# Patient Record
Sex: Male | Born: 1955 | Race: Black or African American | Hispanic: No | Marital: Married | Smoking: Former smoker
Health system: Southern US, Community
[De-identification: ages and names within clinical notes are randomized; demographics above are authoritative.]

## PROBLEM LIST (undated history)

## (undated) DIAGNOSIS — C801 Malignant (primary) neoplasm, unspecified: Secondary | ICD-10-CM

## (undated) DIAGNOSIS — F4310 Post-traumatic stress disorder, unspecified: Secondary | ICD-10-CM

## (undated) DIAGNOSIS — J449 Chronic obstructive pulmonary disease, unspecified: Secondary | ICD-10-CM

## (undated) DIAGNOSIS — J439 Emphysema, unspecified: Secondary | ICD-10-CM

## (undated) DIAGNOSIS — I10 Essential (primary) hypertension: Secondary | ICD-10-CM

## (undated) DIAGNOSIS — J302 Other seasonal allergic rhinitis: Secondary | ICD-10-CM

## (undated) HISTORY — PX: LUNG LOBECTOMY: SHX167

## (undated) HISTORY — DX: Other seasonal allergic rhinitis: J30.2

## (undated) HISTORY — DX: Post-traumatic stress disorder, unspecified: F43.10

## (undated) HISTORY — DX: Essential (primary) hypertension: I10

## (undated) HISTORY — PX: BREAST SURGERY: SHX581

## (undated) HISTORY — DX: Malignant (primary) neoplasm, unspecified: C80.1

---

## 1978-12-17 HISTORY — PX: OTHER SURGICAL HISTORY: SHX169

## 1980-12-17 HISTORY — PX: OTHER SURGICAL HISTORY: SHX169

## 1999-12-18 HISTORY — PX: COLON RESECTION: SHX5231

## 2010-06-23 ENCOUNTER — Emergency Department (HOSPITAL_COMMUNITY): Admission: EM | Admit: 2010-06-23 | Discharge: 2010-06-23 | Payer: Self-pay | Admitting: Family Medicine

## 2010-07-11 ENCOUNTER — Telehealth: Payer: Self-pay | Admitting: Physician Assistant

## 2010-07-11 ENCOUNTER — Ambulatory Visit: Payer: Self-pay | Admitting: Internal Medicine

## 2010-07-11 ENCOUNTER — Encounter: Payer: Self-pay | Admitting: Physician Assistant

## 2010-07-11 ENCOUNTER — Encounter (INDEPENDENT_AMBULATORY_CARE_PROVIDER_SITE_OTHER): Payer: Self-pay | Admitting: *Deleted

## 2010-07-11 DIAGNOSIS — R350 Frequency of micturition: Secondary | ICD-10-CM

## 2010-07-11 DIAGNOSIS — Z8546 Personal history of malignant neoplasm of prostate: Secondary | ICD-10-CM | POA: Insufficient documentation

## 2010-07-11 DIAGNOSIS — I10 Essential (primary) hypertension: Secondary | ICD-10-CM | POA: Insufficient documentation

## 2010-07-11 DIAGNOSIS — J310 Chronic rhinitis: Secondary | ICD-10-CM | POA: Insufficient documentation

## 2010-07-11 DIAGNOSIS — R319 Hematuria, unspecified: Secondary | ICD-10-CM

## 2010-07-11 DIAGNOSIS — Z85038 Personal history of other malignant neoplasm of large intestine: Secondary | ICD-10-CM | POA: Insufficient documentation

## 2010-07-11 DIAGNOSIS — B1710 Acute hepatitis C without hepatic coma: Secondary | ICD-10-CM | POA: Insufficient documentation

## 2010-07-11 LAB — CONVERTED CEMR LAB
Glucose, Urine, Semiquant: NEGATIVE
Ketones, urine, test strip: NEGATIVE
Nitrite: NEGATIVE
Rapid HIV Screen: NEGATIVE
Specific Gravity, Urine: >=1.03
pH: 5

## 2010-07-12 ENCOUNTER — Encounter: Payer: Self-pay | Admitting: Physician Assistant

## 2010-07-13 ENCOUNTER — Encounter: Payer: Self-pay | Admitting: Physician Assistant

## 2010-07-14 LAB — CONVERTED CEMR LAB
Albumin: 4 g/dL (ref 3.5–5.2)
BUN: 13 mg/dL (ref 6–23)
Barbiturate Quant, Ur: NEGATIVE
Benzodiazepines.: NEGATIVE
Calcium: 8.8 mg/dL (ref 8.4–10.5)
Casts: NONE SEEN /lpf
Cocaine Metabolites: NEGATIVE
Eosinophils Absolute: 0.2 10*3/uL (ref 0.0–0.7)
Eosinophils Relative: 3 % (ref 0–5)
Glucose, Bld: 105 mg/dL — ABNORMAL HIGH (ref 70–99)
HCT: 42.3 % (ref 39.0–52.0)
Hep B S Ab: NEGATIVE
Hepatitis B Surface Ag: NEGATIVE
Lymphs Abs: 1.6 10*3/uL (ref 0.7–4.0)
MCHC: 31.7 g/dL (ref 30.0–36.0)
MCV: 83.4 fL (ref 78.0–100.0)
Monocytes Absolute: 0.6 10*3/uL (ref 0.1–1.0)
Opiate Screen, Urine: NEGATIVE
Phencyclidine (PCP): NEGATIVE
RBC / HPF: NONE SEEN (ref <3)
RDW: 17.8 % — ABNORMAL HIGH (ref 11.5–15.5)
Sodium: 138 meq/L (ref 135–145)
Squamous Epithelial / LPF: NONE SEEN /lpf
TSH: 1.498 microintl units/mL (ref 0.350–4.500)
WBC, UA: NONE SEEN cells/hpf (ref <3)

## 2010-08-10 ENCOUNTER — Ambulatory Visit (HOSPITAL_COMMUNITY): Admission: RE | Admit: 2010-08-10 | Discharge: 2010-08-10 | Payer: Self-pay | Admitting: Urology

## 2010-08-14 ENCOUNTER — Ambulatory Visit: Payer: Self-pay | Admitting: Gastroenterology

## 2010-08-14 ENCOUNTER — Encounter (INDEPENDENT_AMBULATORY_CARE_PROVIDER_SITE_OTHER): Payer: Self-pay | Admitting: *Deleted

## 2010-08-28 ENCOUNTER — Ambulatory Visit: Payer: Self-pay | Admitting: Gastroenterology

## 2010-08-31 ENCOUNTER — Encounter: Payer: Self-pay | Admitting: Gastroenterology

## 2010-08-31 ENCOUNTER — Inpatient Hospital Stay (HOSPITAL_COMMUNITY): Admission: RE | Admit: 2010-08-31 | Discharge: 2010-09-01 | Payer: Self-pay | Admitting: Urology

## 2010-08-31 ENCOUNTER — Encounter (INDEPENDENT_AMBULATORY_CARE_PROVIDER_SITE_OTHER): Payer: Self-pay | Admitting: Urology

## 2010-09-16 HISTORY — PX: PROSTATECTOMY: SHX69

## 2010-10-03 ENCOUNTER — Encounter: Payer: Self-pay | Admitting: Physician Assistant

## 2010-10-03 DIAGNOSIS — Z860100 Personal history of colon polyps, unspecified: Secondary | ICD-10-CM | POA: Insufficient documentation

## 2010-10-09 ENCOUNTER — Encounter (INDEPENDENT_AMBULATORY_CARE_PROVIDER_SITE_OTHER): Payer: Self-pay | Admitting: Nurse Practitioner

## 2010-10-09 ENCOUNTER — Ambulatory Visit: Payer: Self-pay | Admitting: Physician Assistant

## 2010-10-09 LAB — CONVERTED CEMR LAB
LDL Cholesterol: 114 mg/dL — ABNORMAL HIGH (ref 0–99)
Total CHOL/HDL Ratio: 4.7
VLDL: 17 mg/dL (ref 0–40)

## 2010-10-11 ENCOUNTER — Encounter (INDEPENDENT_AMBULATORY_CARE_PROVIDER_SITE_OTHER): Payer: Self-pay | Admitting: Nurse Practitioner

## 2010-10-25 ENCOUNTER — Ambulatory Visit: Payer: Self-pay | Admitting: Nurse Practitioner

## 2010-10-25 ENCOUNTER — Encounter: Payer: Self-pay | Admitting: Physician Assistant

## 2010-10-25 LAB — CONVERTED CEMR LAB
Bilirubin Urine: NEGATIVE
Glucose, Urine, Semiquant: NEGATIVE
Ketones, urine, test strip: NEGATIVE
Microalb, Ur: 1.8 mg/dL (ref 0.00–1.89)

## 2010-10-26 ENCOUNTER — Encounter (INDEPENDENT_AMBULATORY_CARE_PROVIDER_SITE_OTHER): Payer: Self-pay | Admitting: Nurse Practitioner

## 2010-10-26 LAB — CONVERTED CEMR LAB: HCV Genotype: 2

## 2010-11-08 ENCOUNTER — Ambulatory Visit
Admission: RE | Admit: 2010-11-08 | Discharge: 2010-11-15 | Payer: Self-pay | Source: Home / Self Care | Admitting: Radiation Oncology

## 2010-12-19 ENCOUNTER — Ambulatory Visit
Admission: RE | Admit: 2010-12-19 | Discharge: 2011-01-16 | Payer: Self-pay | Source: Home / Self Care | Attending: Radiation Oncology | Admitting: Radiation Oncology

## 2011-01-16 NOTE — Miscellaneous (Signed)
  Clinical Lists Changes  Problems: Added new problem of COLONIC POLYPS, HX OF (ICD-V12.72) - multiple polyps removed at colo in 09/2010; tubular adenomas; repeat 2012 Observations: Added new observation of PAST MED HX: Prostate cancer, hx of   a.  dx 2011 by biopsy in [place], [place]   b.  appt with Dr. Heloise Purpura 07/25/2010 Hypertension Hepatitis C   a. viral load + 7.2011 Colon cancer, hx of   a.  dx 2000   b.  last colo done 2007 Colonic polyps, hx of   a.  multiple polyps removed at colo in 09/2010; tubular adenomas; repeat 2012 (10/03/2010 11:56) Added new observation of PMH COLONPYP: yes (10/03/2010 11:56)       Past History:  Past Medical History: Prostate cancer, hx of   a.  dx 2011 by biopsy in [place], [place]   b.  appt with Dr. Heloise Purpura 07/25/2010 Hypertension Hepatitis C   a. viral load + 7.2011 Colon cancer, hx of   a.  dx 2000   b.  last colo done 2007 Colonic polyps, hx of   a.  multiple polyps removed at colo in 09/2010; tubular adenomas; repeat 2012

## 2011-01-16 NOTE — Procedures (Signed)
Summary: Colonoscopy  Patient: Jose Ferguson Note: All result statuses are Final unless otherwise noted.  Tests: (1) Colonoscopy (COL)   COL Colonoscopy           DONE     [facility]     [address]     [place], [place]  [address]           COLONOSCOPY PROCEDURE REPORT           PATIENT:  [name], [name]  MR#:  [id number]     BIRTHDATE:  [birth year], 54 yrs. old  GENDER:  male     ENDOSCOPIST:  Rachael Fee, MD     REF. BY:  Julieanne Manson, M.D.     PROCEDURE DATE:  08/28/2010     PROCEDURE:  Colonoscopy with snare polypectomy     ASA CLASS:  Class II     INDICATIONS:  history of colon cancer (colon resection/chemo     around 2000 at [facility])     MEDICATIONS:   Fentanyl 75 mcg IV, Versed 7 mg IV           DESCRIPTION OF PROCEDURE:   After the risks benefits and     alternatives of the procedure were thoroughly explained, informed     consent was obtained.  Digital rectal exam was performed and     revealed no rectal masses.   The [id number] endoscope was     introduced through the anus and advanced to the anastomosis,     without limitations.  The quality of the prep was adequate, using     MoviPrep.  The instrument was then slowly withdrawn as the colon     was fully examined.     <<PROCEDUREIMAGES>>     FINDINGS:  There were 30 small, sessile polyps found in colon     spread evenly throughout. These were 2-23mm across. All were     removed with cold snare, most were retrieved and sent to pathology     in two jars (see image3, image6, and image8).  There was a     surgical anastomosis. Ileocolonic anastomosis was slightly     ulcerated (linearly) but was otherwise normal) (see image1 and     image4).  This was otherwise a normal examination of the colon     (see image9).   Retroflexed views in the rectum revealed no     abnormalities.    The scope was then withdrawn from the patient     and the procedure completed.     COMPLICATIONS:  None        ENDOSCOPIC IMPRESSION:     1) Thirty subcentimeter polyps, all removed.  Most retrieved and     sent to pathology     2) Ileocolonic anastomosis, linearly ulcerated but otherwise     normal     3) Otherwise normal examination           RECOMMENDATIONS:     1) You will receive a letter within 1-2 weeks with the results     of your biopsy as well as final recommendations. Please call my     office if you have not received a letter after 3 weeks.  Likely     you will need a repeat examination in one year.           ______________________________     Rachael Fee, MD  n.     eSIGNED:   Rachael Fee at 08/28/2010 11:54 AM           Delford Field, [id number]  Note: An exclamation mark (!) indicates a result that was not dispersed into the flowsheet. Document Creation Date: 08/28/2010 11:56 AM _______________________________________________________________________  (1) Order result status: Final Collection or observation date-time: 08/28/2010 11:49 Requested date-time:  Receipt date-time:  Reported date-time:  Referring Physician:   Ordering Physician: Rob Bunting [phone]) Specimen Source:  Source: Launa Grill Order Number: [id number] Lab site:   Appended Document: Colonoscopy     Procedures Next Due Date:    Colonoscopy: 08/2011

## 2011-01-16 NOTE — Assessment & Plan Note (Signed)
Summary: HTN   Vital Signs:  Patient profile:   55 year old male Height:      71 inches Weight:      221.2 pounds BMI:     30.96 Temp:     98.0 degrees F oral Pulse rate:   88 / minute Pulse rhythm:   regular Resp:     20 per minute BP sitting:   134 / 100  (left arm) Cuff size:   large  Vitals Entered By: Levon Hedger (October 25, 2010 11:05 AM)  Nutrition Counseling: Patient's BMI is greater than 25 and therefore counseled on weight management options. CC: CPE, Hypertension Management Is Patient Diabetic? No Pain Assessment Patient in pain? no       Does patient need assistance? Functional Status Self care Ambulation Normal   Primary Care Provider:  Tereso Newcomer, PA-C  CC:  CPE and Hypertension Management.  History of Present Illness:  Pt into the office for a complete physical exam.  Optho - wears reading glasses but no recent eye exam.  Notes that he does have some problems when reading  Dental - he does have an upcoming dental appt  Tdap - unable to recall when he last received his last tetanus.    Hx - prostate cancer (removed 10/01/2010) Pt has f/u with Urology on 11/01/2010 at 10:00  Colonscopy - done 08/2010   Hypertension History:      He denies headache, chest pain, and palpitations.  pt did NOT take his blood pressure medcations yet today.        Positive major cardiovascular risk factors include male age 23 years old or older and hypertension.  Negative major cardiovascular risk factors include non-tobacco-user status.     Habits & Providers  Alcohol-Tobacco-Diet     Alcohol drinks/day: 0     Tobacco Status: quit  Exercise-Depression-Behavior     Does Patient Exercise: yes     Have you felt down or hopeless? no     Have you felt little pleasure in things? no     Depression Counseling: not indicated; screening negative for depression     Drug Use: no  Comments: PHQ- 9 score = 8  Allergies (verified): No Known Drug  Allergies  Social History: Does Patient Exercise:  yes  Review of Systems General:  Denies fever. Eyes:  Complains of blurring; when reading. ENT:  Denies earache. CV:  Denies fatigue. Resp:  Denies cough. GI:  Denies abdominal pain, nausea, and vomiting. GU:  Complains of urinary frequency; denies dysuria and nocturia. MS:  Denies joint pain. Derm:  Denies dryness. Neuro:  Denies headaches. Psych:  Denies anxiety and depression.  Physical Exam  General:  alert.   Head:  normocephalic.  bald Eyes:  pupils equal and pupils round.   Ears:  bil TM with bony landmarks Nose:  no nasal discharge.   Mouth:  pharynx pink and moist and poor dentition.  uvula midline Neck:  supple.   Chest Wall:  no masses.   Breasts:  no gynecomastia.   Lungs:  normal breath sounds.   Heart:  normal rate and regular rhythm.   Abdomen:  normal bowel sounds.   Rectal:  defer Genitalia:  defer Prostate:  defer Msk:  normal ROM.   Pulses:  R radial normal and L radial normal.   Extremities:  no edema Neurologic:  alert & oriented X3.   Skin:  color normal.   Psych:  Oriented X3.     Impression &  Recommendations:  Problem # 1:  HYPERTENSION (ICD-401.9) BP elevated today DASH diet pt has NOT taken his meds today His updated medication list for this problem includes:    Lisinopril 10 Mg Tabs (Lisinopril) .Marland Kitchen... Take 1 tablet by mouth once a day  Orders: T-Urine Microalbumin w/creat. ratio [phone]) EKG w/ Interpretation (93000)  Problem # 2:  PROSTATE CANCER, HX OF (ICD-V10.46) pt to maintain f/u with urology  Problem # 3:  NEED PROPHYLACTIC VACCINATION&INOCULATION FLU (ICD-V04.81) given today in office  Problem # 4:  HEPATITIS C (ICD-070.51) will need referral Medical speciality services Orders: T-Hepatitis C Viral Load (44010-27253) T-Hepattis C Genotype, DNA (66440-34742) T-Protime, Auto (59563-87564) will given tetanus today  Problem # 6:  CHRONIC RHINITIS  (ICD-472.0) advised pt to use nasal spray two times a day   Complete Medication List: 1)  Lisinopril 10 Mg Tabs (Lisinopril) .... Take 1 tablet by mouth once a day 2)  Flonase 50 Mcg/act Susp (Fluticasone propionate) .... One spray in each nostril two times a day  Other Orders: Flu Vaccine 34yrs + (33295) Admin 1st Vaccine (18841) UA Dipstick w/o Micro (manual) (81002) Tdap => 16yrs IM (66063) Admin of Any Addtl Vaccine (01601)  Hypertension Assessment/Plan:      The patient's hypertensive risk group is category B: At least one risk factor (excluding diabetes) with no target organ damage.  His calculated 10 year risk of coronary heart disease is 22 %.  Today's blood pressure is 134/100.  His blood pressure goal is < 140/90.  Patient Instructions: 1)  You have received the flu and tetanus vaccine today. 2)  Your labs will be checked for hepatitis.  You will need referral to the specialist. 3)  Return to office in 2 weeks for blood pressure. 4)  Take your medications before this visit   Orders Added: 1)  Flu Vaccine 69yrs + [90658] 2)  Admin 1st Vaccine [90471] 3)  Est. Patient Level III [09323] 4)  T-Urine Microalbumin w/creat. ratio [82043-[address]] 5)  UA Dipstick w/o Micro (manual) [81002] 6)  EKG w/ Interpretation [93000] 7)  T-Hepatitis C Viral Load [phone] 8)  T-Hepattis C Genotype, DNA [87902-82925] 9)  T-Protime, Auto [27062-37628] 10)  Tdap => 47yrs IM [90715] 11)  Admin of Any Addtl Vaccine [90472]   Immunizations Administered:  Influenza Vaccine # 1:    Vaccine Type: Fluvax 3+    Site: left deltoid    Mfr: GlaxoSmithKline    Dose: 0.5 ml    Route: IM    Given by: Levon Hedger    Exp. Date: 06/16/2011    Lot #: [id number]    VIS given: 07/11/10 version given October 25, 2010.  Tetanus Vaccine:    Vaccine Type: Tdap    Site: right deltoid    Mfr: Sanofi Pasteur    Dose: 0.5 ml    Route: IM    Given by: Levon Hedger    Exp. Date: 01/03/2013     Lot #: [id number]    VIS given: 11/03/08 version given October 25, 2010.  Flu Vaccine Consent Questions:    Do you have a history of severe allergic reactions to this vaccine? no    Any prior history of allergic reactions to egg and/or gelatin? no    Do you have a sensitivity to the preservative Thimersol? no    Do you have a past history of Guillan-Barre Syndrome? no    Do you currently have an acute febrile illness? no    Have you  ever had a severe reaction to latex? no    Vaccine information given and explained to patient? yes    ndc  [phone]  Immunizations Administered:  Influenza Vaccine # 1:    Vaccine Type: Fluvax 3+    Site: left deltoid    Mfr: GlaxoSmithKline    Dose: 0.5 ml    Route: IM    Given by: Levon Hedger    Exp. Date: 06/16/2011    Lot #: [id number]    VIS given: 07/11/10 version given October 25, 2010.  Tetanus Vaccine:    Vaccine Type: Tdap    Site: right deltoid    Mfr: Sanofi Pasteur    Dose: 0.5 ml    Route: IM    Given by: Levon Hedger    Exp. Date: 01/03/2013    Lot #: [id number]    VIS given: 11/03/08 version given October 25, 2010.  Laboratory Results   Urine Tests  Date/Time Received: October 25, 2010 11:20 AM   Routine Urinalysis   Glucose: negative   (Normal Range: Negative) Bilirubin: negative   (Normal Range: Negative) Ketone: negative   (Normal Range: Negative) Spec. Gravity: >=1.030   (Normal Range: 1.003-1.035) Blood: trace-intact   (Normal Range: Negative) pH: 5.0   (Normal Range: 5.0-8.0) Protein: negative   (Normal Range: Negative) Urobilinogen: 0.2   (Normal Range: 0-1) Nitrite: negative   (Normal Range: Negative) Leukocyte Esterace: trace   (Normal Range: Negative)          Prevention & Chronic Care Immunizations   Influenza vaccine: Fluvax 3+  (10/25/2010)    Tetanus booster: 10/25/2010: Tdap    Pneumococcal vaccine: Not documented  Colorectal Screening   Hemoccult: recent colonscopy   (10/25/2010)    Colonoscopy: DONE  (08/28/2010)   Colonoscopy due: 08/2011  Other Screening   PSA: done at Urology  (10/25/2010)   Smoking status: quit  (10/25/2010)  Lipids   Total Cholesterol: 166  (10/09/2010)   LDL: 114  (10/09/2010)   LDL Direct: Not documented   HDL: 35  (10/09/2010)   Triglycerides: 85  (10/09/2010)  Hypertension   Last Blood Pressure: 134 / 100  (10/25/2010)   Serum creatinine: 0.95  (07/12/2010)   Serum potassium 4.1  (07/12/2010)  Self-Management Support :    Hypertension self-management support: Not documented   Nursing Instructions: Give Flu vaccine today     EKG  Procedure date:  10/25/2010  Findings:      sinus rhythm with PVS   Appended Document: Lab redraw Pt. will need to RTC to have Hepatitis C RNA Quant and Hepatitis C Virus Genotype redrawn. Left message on answer machine for pt. to return call. Gaylyn Cheers RN  October 25, 2010 4:55 PM  Pt. returned call will come in today for repeat labs Gaylyn Cheers RN  October 26, 2010 11:03 AM  Pt. had labs redrawn to day Gaylyn Cheers RN  October 26, 2010 4:50 PM        Clinical Lists Changes

## 2011-01-16 NOTE — Miscellaneous (Signed)
Summary: [facility] Previsit/perp  Clinical Lists Changes  Medications: Added new medication of DULCOLAX 5 MG  TBEC (BISACODYL) Day before procedure take 2 at 3pm and 2 at 8pm. - Signed Added new medication of METOCLOPRAMIDE HCL 10 MG  TABS (METOCLOPRAMIDE HCL) As per prep instructions. - Signed Added new medication of MIRALAX   POWD (POLYETHYLENE GLYCOL 3350) As per prep  instructions. - Signed Rx of DULCOLAX 5 MG  TBEC (BISACODYL) Day before procedure take 2 at 3pm and 2 at 8pm.;  #4 x 0;  Signed;  Entered by: Wyona Almas RN;  Authorized by: Rachael Fee MD;  Method used: Electronically to [facility], [place], [place]  [id number], Ph: [phone], Fax: [phone] Rx of METOCLOPRAMIDE HCL 10 MG  TABS (METOCLOPRAMIDE HCL) As per prep instructions.;  #2 x 0;  Signed;  Entered by: Wyona Almas RN;  Authorized by: Rachael Fee MD;  Method used: Electronically to [facility], [place], [place]  [id number], Ph: [phone], Fax: [phone] Rx of MIRALAX   POWD (POLYETHYLENE GLYCOL 3350) As per prep  instructions.;  #255gm x 0;  Signed;  Entered by: Wyona Almas RN;  Authorized by: Rachael Fee MD;  Method used: Electronically to [facility], [place], [place]  [id number], Ph: [phone], Fax: [phone] Observations: Added new observation of NKA: T (08/14/2010 14:04)    Prescriptions: MIRALAX   POWD (POLYETHYLENE GLYCOL 3350) As per prep  instructions.  #255gm x 0   Entered by:   Wyona Almas RN   Authorized by:   Rachael Fee MD   Signed by:   Wyona Almas RN on 08/14/2010   Method used:   Electronically to        [facility] (retail)       [address]       [place], [place]  [id number]       Ph: [phone]       Fax: [phone]   RxID:   [id number] METOCLOPRAMIDE HCL 10 MG  TABS (METOCLOPRAMIDE HCL) As per prep  instructions.  #2 x 0   Entered by:   Wyona Almas RN   Authorized by:   Rachael Fee MD   Signed by:   Wyona Almas RN on 08/14/2010   Method used:   Electronically to        [facility] (retail)       [address]       [place], [place]  [id number]       Ph: [phone]       Fax: [phone]   RxID:   [id number] DULCOLAX 5 MG  TBEC (BISACODYL) Day before procedure take 2 at 3pm and 2 at 8pm.  #4 x 0   Entered by:   Wyona Almas RN   Authorized by:   Rachael Fee MD   Signed by:   Wyona Almas RN on 08/14/2010   Method used:   Electronically to        [facility] (retail)       [address]       [place], [place]  [id number]       Ph: [phone]       Fax: [phone]   RxID:   [id number]

## 2011-01-16 NOTE — Letter (Signed)
Summary: [facility] Instructions  [facility]  [address] [place], [place] [address]   Phone: [phone]  Fax: [phone]       Jose Ferguson    [birth year]    MRN: [id number]       Procedure Day [name]:  Duanne Ferguson  08/28/10     Arrival Time: 10:00AM     Procedure Time:  11:00AM     Location of Procedure:                    X  [facility] (4th Floor)   PREPARATION FOR COLONOSCOPY WITH MIRALAX  Starting 5 days prior to your procedure 08/23/10 do not eat nuts, seeds, popcorn, corn, beans, peas,  salads, or any raw vegetables.  Do not take any fiber supplements (e.g. Metamucil, Citrucel, and Benefiber). ____________________________________________________________________________________________________   THE DAY BEFORE YOUR PROCEDURE         DATE: 08/27/10  DAY: SUNDAY  1   Drink clear liquids the entire day-NO SOLID FOOD  2   Do not drink anything colored red or purple.  Avoid juices with pulp.  No orange juice.  3   Drink at least 64 oz. (8 glasses) of fluid/clear liquids during the day to prevent dehydration and help the prep work efficiently.  CLEAR LIQUIDS INCLUDE: Water Jello Ice Popsicles Tea (sugar ok, no milk/cream) Powdered fruit flavored drinks Coffee (sugar ok, no milk/cream) Gatorade Juice: apple, white grape, white cranberry  Lemonade Clear bullion, consomm, broth Carbonated beverages (any kind) Strained chicken noodle soup Hard Candy  4   Mix the entire bottle of Miralax with 64 oz. of Gatorade/Powerade in the morning and put in the refrigerator to chill.  5   At 3:00 pm take 2 Dulcolax/Bisacodyl tablets.  6   At 4:30 pm take one Reglan/Metoclopramide tablet.  7  Starting at 5:00 pm drink one 8 oz glass of the Miralax mixture every 15-20 minutes until you have finished drinking the entire 64 oz.  You should finish drinking prep around 7:30 or 8:00 pm.  8   If you are nauseated, you may take the 2nd Reglan/Metoclopramide tablet at  6:30 pm.        9    At 8:00 pm take 2 more DULCOLAX/Bisacodyl tablets.     THE DAY OF YOUR PROCEDURE      DATE:  08/28/10  DAY: Duanne Ferguson  You may drink clear liquids until 9:00AM  (2 HOURS BEFORE PROCEDURE).   MEDICATION INSTRUCTIONS  Unless otherwise instructed, you should take regular prescription medications with a small sip of water as early as possible the morning of your procedure.          OTHER INSTRUCTIONS  You will need a responsible adult at least 57 years of age to accompany you and drive you home.   This person must remain in the waiting room during your procedure.  Wear loose fitting clothing that is easily removed.  Leave jewelry and other valuables at home.  However, you may wish to bring a book to read or an iPod/MP3 player to listen to music as you wait for your procedure to start.  Remove all body piercing jewelry and leave at home.  Total time from sign-in until discharge is approximately 2-3 hours.  You should go home directly after your procedure and rest.  You can resume normal activities the day after your procedure.  The day of your procedure you should not:   Drive  Make legal decisions   Operate machinery   Drink alcohol   Return to work  You will receive specific instructions about eating, activities and medications before you leave.   The above instructions have been reviewed and explained to me by   Wyona Almas RN  August 14, 2010 3:11 PM     I fully understand and can verbalize these instructions _____________________________ Date _______

## 2011-01-16 NOTE — Letter (Signed)
Summary: Previsit letter  [facility]  [address] [place], [place] [address]   Phone: [phone]  Fax: [phone]       07/11/2010 MRN: [id number]  Jose Ferguson [address] [place], [place]  [address]  Dear Mr. Shadduck,  Welcome to the Gastroenterology Division at [facility].    You are scheduled to see a nurse for your pre-procedure visit on 08-14-10 at 2:30p.m. on the 3rd floor at [facility], [address]  We ask that you try to arrive at our office 15 minutes prior to your appointment time to allow for check-in.  Your nurse visit will consist of discussing your medical and surgical history, your immediate family medical history, and your medications.    Please bring a complete list of all your medications or, if you prefer, bring the medication bottles and we will list them.  We will need to be aware of both prescribed and over the counter drugs.  We will need to know exact dosage information as well.  If you are on blood thinners (Coumadin, Plavix, Aggrenox, Ticlid, etc.) please call our office today/prior to your appointment, as we need to consult with your physician about holding your medication.   Please be prepared to read and sign documents such as consent forms, a financial agreement, and acknowledgement forms.  If necessary, and with your consent, a friend or relative is welcome to sit-in on the nurse visit with you.  Please bring your insurance card so that we may make a copy of it.  If your insurance requires a referral to see a specialist, please bring your referral form from your primary care physician.  No co-pay is required for this nurse visit.     If you cannot keep your appointment, please call [phone] to cancel or reschedule prior to your appointment date.  This allows [place] the opportunity to schedule an appointment for another patient in need of care.    Thank you for choosing [facility] for your medical needs.  We  appreciate the opportunity to care for you.  Please visit [place] at our website  to learn more about our practice.                     Sincerely.                                                                                                                   The Gastroenterology Division

## 2011-01-16 NOTE — Progress Notes (Signed)
Summary: GI referral  Phone Note Outgoing Call   Summary of Call: Needs GI referral for follow up colonoscopy for h/o colon cancer.  Referral letter in system. Initial call taken by: Tereso Newcomer PA-C,  July 11, 2010 3:12 PM

## 2011-01-16 NOTE — Assessment & Plan Note (Signed)
Summary: FXU;HTN; UNTREATED PROSTATE CANCER;RX REFILL//JG   Vital Signs:  Patient profile:   55 year old male Temp:     98.0 degrees F oral Pulse rate:   91 / minute Pulse rhythm:   regular Resp:     18 per minute BP sitting:   113 / 79  (right arm) Cuff size:   large  Vitals Entered By: Arm[place] Shannon (July 11, 2010 2:09 PM) CC: follow-up visit, prostate cancer, occassional prostate pain ( 4 months), aching pain, constantRx refill, Hypertension Management Is Patient Diabetic? No Pain Assessment Patient in pain? no       Does patient need assistance? Functional Status Self care Ambulation Normal   Primary Care Provider:  Tereso Newcomer, PA-C  CC:  follow-up visit, prostate cancer, occassional prostate pain ( 4 months), aching pain, constantRx refill, and Hypertension Management.  History of Present Illness: New patient.  Just released from prison June 9 after 35 years.  He was on BP meds while in prison.  Dx earlier this year.  Also, dx with prostate cancer in February.  States he had pain in groin and testicles and numbness in penis.  He states he was seen by a urologist in [place], [place] and had a biopsy.  Biopsy was + for cancer.  No treatment was started.  He has made an appt with Dr. Heloise Purpura 07/25/2010 at [facility].  Hypertension History:      He denies headache, chest pain, dyspnea with exertion, and syncope.  He notes no problems with any antihypertensive medication side effects.        Positive major cardiovascular risk factors include male age 90 years old or older and hypertension.  Negative major cardiovascular risk factors include non-tobacco-user status.     Habits & Providers  Alcohol-Tobacco-Diet     Tobacco Status: quit  Exercise-Depression-Behavior     Drug Use: no  Allergies (verified): No Known Drug Allergies  Past History:  Past Medical History: Prostate cancer, hx of   a.  dx 2011 by biopsy in [place], [place]   b.  appt with Dr. Heloise Purpura 07/25/2010 Hypertension Hepatitis C Colon cancer, hx of   a.  dx 2000   b.  last colo done 2007  Past Surgical History: s/p prostate biopsy 01/2010 s/p partial colectomy 2000 2/2 cancer   a.  last colo. done 2007 s/p right thumb surgery   a.  lacerated distal flexor tendon (patient cannot flex distal thumb) s/p right inguinal hernia repair  Family History: DM - sis HTN - 3 sis Colon cancer - sister  Social History: unemployed just released from prison after 22 years Former Smoker (quit 2008)   a.  36 pack years Alcohol use-no   a.  previously heavy drinker (case of beer a day) Drug use-no   a.  admits to previous marijuana use Smoking Status:  quit Drug Use:  no  Review of Systems      See HPI General:  Denies chills and fever. ENT:  Complains of nasal congestion. CV:  Denies chest pain or discomfort and shortness of breath with exertion. Resp:  Denies cough. GI:  Denies bloody stools and dark tarry stools. GU:  Complains of urinary frequency. Psych:  Denies depression.  Physical Exam  General:  alert, well-developed, and well-nourished.   Head:  normocephalic and atraumatic.   Eyes:  pupils equal, pupils round, pupils reactive to light, and no optic disk abnormalities.   Ears:  R ear normal and L  ear normal.   Nose:  mucosal erythema and mucosal edema.   Mouth:  pharynx pink and moist, no erythema, and no exudates.   Neck:  supple, no thyromegaly, and no cervical lymphadenopathy.   Lungs:  normal breath sounds, no crackles, and no wheezes.   Heart:  normal rate, regular rhythm, and no murmur.  occ skipped beat  Abdomen:  soft, non-tender, normal bowel sounds, and no hepatomegaly.   Extremities:  no edema Neurologic:  alert & oriented X3 and cranial nerves II-XII intact.   Psych:  normally interactive and good eye contact.     Impression & Recommendations:  Problem # 1:  HYPERTENSION (ICD-401.9)  controlled continue current meds  His updated  medication list for this problem includes:    Lisinopril 10 Mg Tabs (Lisinopril) .Marland Kitchen... Take 1 tablet by mouth once a day  Orders: T-Comprehensive Metabolic Panel (25366-44034) T-CBC w/Diff (74259-56387) T-TSH (56433-29518) EKG w/ Interpretation (93000)  Problem # 2:  CHRONIC RHINITIS (ICD-472.0) ? rhinitis medicamentosa rec he stop using the afrin use flonase once daily for 4-6 weeks  Problem # 3:  PROSTATE CANCER, HX OF (ICD-V10.46)  has appt with urology 07/25/2010 has had some increased urination . . . will check u/a will let urologist check PSA, etc. he has records from urology when he was in prison  Orders: UA Dipstick w/o Micro (manual) (84166) T-CBC w/Diff (06301-60109) T-Culture, Urine (32355-73220) T- * Misc. Laboratory test [phone])  Problem # 4:  FREQUENCY, URINARY (ICD-788.41)  Orders: UA Dipstick w/o Micro (manual) (06237) T-Culture, Urine (62831-51761) T- * Misc. Laboratory test [phone])  Problem # 5:  COLON CANCER, HX OF (ICD-V10.05)  no f/u since about 2007 will try to get records refer him to GI for appropriate f/u  Orders: T-CBC w/Diff (10626-94854) T-Hemoccult Cards-Multiple (62703) Gastroenterology Referral (GI)  Problem # 6:  HEPATITIS C (ICD-070.51)  check labs once prostate and colon issues managed, can consider referral to hep c clinic  Orders: T-Comprehensive Metabolic Panel [id number]) T-Drug Screen-Urine, (single) (93716-96789) T-Hepatitis A Antibody (38101-75102) T-Hepatitis B Core Antibody (58527-78242) T-Hepatitis B Surface Antibody (35361-44315) T-Hepatitis B Surface Antigen (40086-76195) T-Hepatitis C Antibody (09326-71245) T-Hepatitis C Viral Load (80998-33825) T-Syphilis Test (RPR) (05397-67341) Rapid HIV  (92370)  Complete Medication List: 1)  Lisinopril 10 Mg Tabs (Lisinopril) .... Take 1 tablet by mouth once a day 2)  Flonase 50 Mcg/act Susp (Fluticasone propionate) .Marland Kitchen.. 1-2 sprays each nostril once  daily  Hypertension Assessment/Plan:      The patient's hypertensive risk group is category B: At least one risk factor (excluding diabetes) with no target organ damage.  Today's blood pressure is 113/79.  His blood pressure goal is < 140/90.  Patient Instructions: 1)  Stop using Afrin and over the counter nasal decongestants. 2)  Use the flonase once daily for 4-6 weeks.  Tape off when you are finished (use every other day for a few days, then stop). 3)  Sign forms to get records.  Need records on your colon cancer, especially. 4)  Please ask the urologist and the gastroenterologist to send me notes.   5)  If you do not hear about an appointment with the gastroenterologist in the next 2 weeks, call us. 6)  Come in for fasting lipids some time before your CPE (Dx. V70.0, 401.1) 7)  Please schedule a follow-up appointment in 3 months with Dushawn Pusey for CPE.  Prescriptions: FLONASE 50 MCG/ACT SUSP (FLUTICASONE PROPIONATE) 1-2 sprays each nostril once daily  #1 x 3  Entered and Authorized by:   Tereso Newcomer PA-C   Signed by:   Tereso Newcomer PA-C on 07/11/2010   Method used:   Print then Give to Patient   RxID:   [id number] LISINOPRIL 10 MG TABS (LISINOPRIL) Take 1 tablet by mouth once a day  #30 x 5   Entered and Authorized by:   Tereso Newcomer PA-C   Signed by:   Tereso Newcomer PA-C on 07/11/2010   Method used:   Print then Give to Patient   RxID:   [id number]   Laboratory Results   Urine Tests    Routine Urinalysis   Color: lt. yellow Glucose: negative   (Normal Range: Negative) Bilirubin: negative   (Normal Range: Negative) Ketone: negative   (Normal Range: Negative) Spec. Gravity: >=1.030   (Normal Range: 1.003-1.035) Blood: small   (Normal Range: Negative) pH: 5.0   (Normal Range: 5.0-8.0) Protein: negative   (Normal Range: Negative) Urobilinogen: 1.0   (Normal Range: 0-1) Nitrite: negative   (Normal Range: Negative) Leukocyte Esterace: negative   (Normal Range:  Negative)      Other Tests  Rapid HIV: negative    Appended Document: FXU;HTN; UNTREATED PROSTATE CANCER;RX REFILL//JG    Clinical Lists Changes  Observations: Added new observation of EKG INTERP: Normal sinus rhythm with rate of:  82 q wave in 3 normal axis PVC no isch changes  (07/11/2010 16:16)       EKG  Procedure date:  07/11/2010  Findings:      Normal sinus rhythm with rate of:  82 q wave in 3 normal axis PVC no isch changes

## 2011-01-16 NOTE — Letter (Signed)
Summary: Lipid Letter  [facility]  [address] [place], [place] [address]   Phone: [phone]  Fax: [phone]    10/11/2010  Cortlan Dolin [address] [place], [place]  [address]  Dear Jillyn Hidden:  We have carefully reviewed your last lipid profile from 10/09/2010 and the results are noted below with a summary of recommendations for lipid management.    Cholesterol:       166     Goal: less than 200   HDL "good" Cholesterol:   35     Goal: greater than 40   LDL "bad" Cholesterol:   114     Goal: less than 130   Triglycerides:       85     Goal: less than 150    Cholesterol labs are ok.     Current Medications: 1)    Lisinopril 10 Mg Tabs (Lisinopril) .... Take 1 tablet by mouth once a day 2)    Flonase 50 Mcg/act Susp (Fluticasone propionate) .Marland Kitchen.. 1-2 sprays each nostril once daily  If you have any questions, please call. We appreciate being able to work with you.   Sincerely,    Lehman Prom, FNP [facility]

## 2011-01-16 NOTE — Letter (Signed)
Summary: REFERRAL/GASTRONENTEROLOGY//APPT DATE & TIME  REFERRAL/GASTRONENTEROLOGY//APPT DATE & TIME   Imported By: Arta Bruce 07/12/2010 12:44:58  _____________________________________________________________________  External Attachment:    Type:   Image     Comment:   External Document

## 2011-01-16 NOTE — Letter (Signed)
Summary: Results Letter  [name]  [address] [place], [place] [address]   Phone: [phone]  Fax: [phone]        August 31, 2010 MRN: [id number]    Jose Ferguson [address] , [place]  [address]    Dear Mr. Neuhaus,   The polyp removed during your recent procedure were proven to be adenomatous.  These are pre-cancerous polyps that may have grown into cancers if they had not been removed.  Based on current nationally recognized surveillance guidelines, I recommend that you have a repeat colonoscopy in 1 year.   We will therefore put your information in our reminder system and will contact you in 1 year to schedule a repeat procedure.  Please call if you have any questions or concerns.       Sincerely,  Rachael Fee MD  This letter has been electronically signed by your physician.  Appended Document: Results Letter letter mailed

## 2011-01-16 NOTE — Letter (Signed)
Summary: *HSN Results Follow up  [facility]  [address] [place], [place] [address]   Phone: [phone]  Fax: [phone]      07/13/2010   Delford Field [address] [place], [place]  [address]   Dear  Mr. Delford Field,                            ____S.Drinkard,FNP   ____D. Gore,FNP       ____B. McPherson,MD   ____V. Rankins,MD    ____E. Mulberry,MD    ____N. Daphine Deutscher, FNP  ____D. Reche Dixon, MD    ____K. Philipp Deputy, MD    __x__S. Alben Spittle, PA-C     This letter is to inform you that your recent test(s):  _______Pap Smear    ___x____Lab Test     _______X-ray    ___x____ is within acceptable limits . . . see below  _______ requires a medication change  _______ requires a follow-up lab visit  _______ requires a follow-up visit with your provider   Comments: Blood counts, thyroid, liver, kidney function all normal.  Syphillis test negative.  HIV test negative.  Hepatitis C labs are still running.  A urine culture is still running.  We will contact you with those results.     _________________________________________________________ If you have any questions, please contact our office                     Sincerely,  Tereso Newcomer PA-C [facility]

## 2011-01-16 NOTE — Progress Notes (Signed)
Summary: Office Visit//DEPRESSION SCREENING  Office Visit//DEPRESSION SCREENING   Imported By: Arta Bruce 10/25/2010 14:34:18  _____________________________________________________________________  External Attachment:    Type:   Image     Comment:   External Document

## 2011-01-16 NOTE — Letter (Signed)
Summary: *Referral Letter  [facility]  [address] [place], [place] [address]   Phone: [phone]  Fax: [phone]    07/11/2010  Thank you in advance for agreeing to see my patient:  Jose Ferguson [address] [place], [place]  [address]  Phone: [phone]  Reason for Referral: 55 yo male s/p partial colectomy due to cancer in 2000 with last colonoscopy done around 2007.  No regular follow up since.  He was released from prison 2 months ago.  Procedures Requested:   Current Medical Problems: 1)  FREQUENCY, URINARY (ICD-788.41) 2)  CHRONIC RHINITIS (ICD-472.0) 3)  COLON CANCER, HX OF (ICD-V10.05) 4)  HEPATITIS C (ICD-070.51) 5)  HYPERTENSION (ICD-401.9) 6)  PROSTATE CANCER, HX OF (ICD-V10.46)   Current Medications: 1)  LISINOPRIL 10 MG TABS (LISINOPRIL) Take 1 tablet by mouth once a day 2)  FLONASE 50 MCG/ACT SUSP (FLUTICASONE PROPIONATE) 1-2 sprays each nostril once daily   Past Medical History: 1)  Prostate cancer, hx of 2)    a.  dx 2011 by biopsy in [place], [place] 3)    b.  appt with Dr. Heloise Purpura 07/25/2010 4)  Hypertension 5)  Hepatitis C 6)  Colon cancer, hx of 7)    a.  dx 2000 8)    b.  last colo done 2007   Prior History of Blood Transfusions:   Pertinent Labs:    Thank you again for agreeing to see our patient; please contact us if you have any further questions or need additional information.  Sincerely,  Tereso Newcomer PA-C

## 2011-01-17 ENCOUNTER — Ambulatory Visit: Payer: Self-pay | Admitting: Radiation Oncology

## 2011-01-19 ENCOUNTER — Ambulatory Visit: Payer: Medicaid Other | Attending: Radiation Oncology | Admitting: Radiation Oncology

## 2011-01-19 DIAGNOSIS — Z510 Encounter for antineoplastic radiation therapy: Secondary | ICD-10-CM | POA: Insufficient documentation

## 2011-01-19 DIAGNOSIS — R5381 Other malaise: Secondary | ICD-10-CM | POA: Insufficient documentation

## 2011-01-19 DIAGNOSIS — C61 Malignant neoplasm of prostate: Secondary | ICD-10-CM | POA: Insufficient documentation

## 2011-02-01 ENCOUNTER — Encounter (INDEPENDENT_AMBULATORY_CARE_PROVIDER_SITE_OTHER): Payer: Self-pay | Admitting: Nurse Practitioner

## 2011-02-01 ENCOUNTER — Encounter: Payer: Self-pay | Admitting: Nurse Practitioner

## 2011-02-01 ENCOUNTER — Telehealth (INDEPENDENT_AMBULATORY_CARE_PROVIDER_SITE_OTHER): Payer: Self-pay | Admitting: Nurse Practitioner

## 2011-02-07 NOTE — Progress Notes (Signed)
Summary: Medical Specialty Referral  Phone Note Outgoing Call   Summary of Call: refer to medical specialty services - hepatitis C Initial call taken by: Lehman Prom FNP,  February 01, 2011 4:56 PM  Follow-up for Phone Call        SENT THE REFERRAL TO [facility] WAITING FOR AN APPT .Marland KitchenCheryll Dessert  February 02, 2011 10:20 AM

## 2011-02-07 NOTE — Assessment & Plan Note (Signed)
Summary: HTN   Vital Signs:  Patient profile:   55 year old male Weight:      223.4 pounds Temp:     97.4 degrees F oral Pulse rate:   88 / minute Pulse rhythm:   regular Resp:     20 per minute BP sitting:   110 / 74  (left arm) Cuff size:   large  Vitals Entered By: Levon Hedger (February 01, 2011 4:04 PM) CC: follow-up visit HTN...needs refills, Hypertension Management Is Patient Diabetic? No Pain Assessment Patient in pain? no       Does patient need assistance? Functional Status Self care Ambulation Normal   Primary Care Provider:  Tereso Newcomer, PA-C  CC:  follow-up visit HTN...needs refills and Hypertension Management.  History of Present Illness:  Pt into the office for htn  Hypertension History:      He denies headache, chest pain, and palpitations.  He notes no problems with any antihypertensive medication side effects.        Positive major cardiovascular risk factors include male age 48 years old or older and hypertension.  Negative major cardiovascular risk factors include no history of diabetes or hyperlipidemia and non-tobacco-user status.     Medications Prior to Update: 1)  Lisinopril 10 Mg Tabs (Lisinopril) .... Take 1 Tablet By Mouth Once A Day 2)  Flonase 50 Mcg/act Susp (Fluticasone Propionate) .... One Spray in Each Nostril Two Times A Day  Allergies (verified): No Known Drug Allergies  Review of Systems General:  Denies fever. ENT:  Complains of nosebleeds; improved with the flonase. CV:  Denies chest pain or discomfort. Resp:  Denies cough. GI:  Denies abdominal pain, nausea, and vomiting.  Physical Exam  General:  alert.   Head:  normocephalic.   Lungs:  normal breath sounds.   Heart:  normal rate and regular rhythm.   Msk:  normal ROM.   Neurologic:  alert & oriented X3.   Skin:  color normal.   Psych:  Oriented X3.     Impression & Recommendations:  Problem # 1:  HYPERTENSION (ICD-401.9) BP stable DASH diet His  updated medication list for this problem includes:    Lisinopril 10 Mg Tabs (Lisinopril) .Marland Kitchen... Take 1 tablet by mouth once a day  Problem # 2:  CHRONIC RHINITIS (ICD-472.0) advise pt to add some humidification to your diet  Problem # 3:  HEPATITIS C (ICD-070.51) labs reviewed from previous visit  Will refer to medical specialty services Orders: Hepatitis C Clinic Referral (HepC)  Complete Medication List: 1)  Lisinopril 10 Mg Tabs (Lisinopril) .... Take 1 tablet by mouth once a day 2)  Flonase 50 Mcg/act Susp (Fluticasone propionate) .... One spray in each nostril two times a day  Hypertension Assessment/Plan:      The patient's hypertensive risk group is category B: At least one risk factor (excluding diabetes) with no target organ damage.  His calculated 10 year risk of coronary heart disease is 11 %.  Today's blood pressure is 110/74.  His blood pressure goal is < 140/90.  Patient Instructions: 1)  Nose - You need a HUMIDIFIER to keep the air moist 2)  Blood pressure - doing well 3)  Both prescriptions have been sent to [facility]. 4)  Follow up in 6 months for blood pressure or sooner if necessary Prescriptions: FLONASE 50 MCG/ACT SUSP (FLUTICASONE PROPIONATE) One spray in each nostril two times a day  #30 x 11   Entered and Authorized by:  Lehman Prom FNP   Signed by:   Lehman Prom FNP on 02/01/2011   Method used:   Electronically to        [facility] [address] (retail)       [address]       [place], [place]  [id number]       Ph: [phone]       Fax: [phone]   RxID:   [id number] LISINOPRIL 10 MG TABS (LISINOPRIL) Take 1 tablet by mouth once a day  #30 x 11   Entered and Authorized by:   Lehman Prom FNP   Signed by:   Lehman Prom FNP on 02/01/2011   Method used:   Electronically to        [facility] (retail)       [address]       [place], [place]  [id number]       Ph: [phone]       Fax:  [phone]   RxID:   [id number]    Orders Added: 1)  Est. Patient Level III [[address]] 2)  Hepatitis C Clinic Referral [HepC]

## 2011-03-01 LAB — CBC
HCT: 39 % (ref 39.0–52.0)
Hemoglobin: 13 g/dL (ref 13.0–17.0)
MCH: 28 pg (ref 26.0–34.0)
MCHC: 33.3 g/dL (ref 30.0–36.0)
MCV: 84 fL (ref 78.0–100.0)
Platelets: 186 10*3/uL (ref 150–400)
RBC: 4.65 MIL/uL (ref 4.22–5.81)
RDW: 14.5 % (ref 11.5–15.5)

## 2011-03-01 LAB — HEMOGLOBIN AND HEMATOCRIT, BLOOD
HCT: 37 % — ABNORMAL LOW (ref 39.0–52.0)
HCT: 44.6 % (ref 39.0–52.0)
Hemoglobin: 12.3 g/dL — ABNORMAL LOW (ref 13.0–17.0)

## 2011-03-01 LAB — BASIC METABOLIC PANEL
Calcium: 8.9 mg/dL (ref 8.4–10.5)
GFR calc non Af Amer: >60 mL/min (ref >60)
Glucose, Bld: 113 mg/dL — ABNORMAL HIGH (ref 70–99)
Sodium: 140 mEq/L (ref 135–145)

## 2011-03-01 LAB — URINE MICROSCOPIC-ADD ON

## 2011-03-01 LAB — URINALYSIS, ROUTINE W REFLEX MICROSCOPIC
Glucose, UA: NEGATIVE mg/dL
Ketones, ur: NEGATIVE mg/dL
Nitrite: NEGATIVE

## 2011-03-01 LAB — SURGICAL PCR SCREEN
MRSA, PCR: NEGATIVE
Staphylococcus aureus: POSITIVE — AB

## 2011-03-01 LAB — TYPE AND SCREEN: Antibody Screen: NEGATIVE

## 2011-03-01 LAB — ABO/RH: ABO/RH(D): A POS

## 2011-05-03 ENCOUNTER — Ambulatory Visit
Admission: RE | Admit: 2011-05-03 | Discharge: 2011-05-03 | Disposition: A | Discharge status: Home / Self Care | Payer: Medicaid Other | Source: Ambulatory Visit | Attending: Radiation Oncology | Admitting: Radiation Oncology

## 2011-05-10 ENCOUNTER — Ambulatory Visit (INDEPENDENT_AMBULATORY_CARE_PROVIDER_SITE_OTHER): Payer: Medicaid Other | Admitting: Gastroenterology

## 2011-05-10 VITALS — BP 112/89 | HR 80 | Temp 97.5°F | Ht 71.0 in | Wt 212.0 lb

## 2011-05-10 DIAGNOSIS — B182 Chronic viral hepatitis C: Secondary | ICD-10-CM

## 2011-05-10 LAB — CBC WITH DIFFERENTIAL/PLATELET
Basophils Relative: 1 % (ref 0–1)
Eosinophils Relative: 5 % (ref 0–5)
HCT: 46.7 % (ref 39.0–52.0)
Lymphocytes Relative: 17 % (ref 12–46)
Lymphs Abs: 1.1 10*3/uL (ref 0.7–4.0)
MCH: 29.8 pg (ref 26.0–34.0)
MCHC: 32.8 g/dL (ref 30.0–36.0)
MCV: 91 fL (ref 78.0–100.0)
Monocytes Relative: 8 % (ref 3–12)
Platelets: 198 10*3/uL (ref 150–400)
RDW: 14.6 % (ref 11.5–15.5)
WBC: 6.3 10*3/uL (ref 4.0–10.5)

## 2011-05-10 LAB — PROTIME-INR: Prothrombin Time: 12.7 seconds (ref 11.6–15.2)

## 2011-05-10 LAB — AFP TUMOR MARKER: AFP-Tumor Marker: 3.7 ng/mL (ref 0.0–8.0)

## 2011-05-10 LAB — HEPATITIS B SURFACE ANTIGEN: Hepatitis B Surface Ag: NEGATIVE

## 2011-05-11 LAB — COMPREHENSIVE METABOLIC PANEL
ALT: 14 U/L (ref 0–53)
AST: 19 U/L (ref 0–37)
Albumin: 4 g/dL (ref 3.5–5.2)
BUN: 14 mg/dL (ref 6–23)
CO2: 22 mEq/L (ref 19–32)
Calcium: 9.4 mg/dL (ref 8.4–10.5)
Chloride: 106 mEq/L (ref 96–112)
Total Bilirubin: 0.7 mg/dL (ref 0.3–1.2)

## 2011-05-11 LAB — ANA: Anti Nuclear Antibody(ANA): NEGATIVE

## 2011-05-11 LAB — IBC PANEL: TIBC: 384 ug/dL (ref 215–435)

## 2011-05-11 LAB — IRON: Iron: 154 ug/dL (ref 42–165)

## 2011-05-16 LAB — HEPATITIS C GENOTYPE: HCV Genotype: 2

## 2011-05-17 NOTE — Progress Notes (Signed)
cc: Sherene Sires., MD  Lehman Prom, NP   REASON FOR REFERRAL:  Positive hepatitis C antibody, genotype unknown.    HISTORY:  The patient is a 55 year old gentleman whom I have been asked to see in consultation by Ms. Daphine Deutscher regarding a positive hepatitis C antibody, genotype unknown. It should be noted that I did not receive  any relevant hepatitis C testing.  According to the patient, he was first diagnosed with hepatitis C while incarcerated. He was seen in the [facility] hepatitis clinic but cannot recall any details of being counted. I do not have access to  his [facility] records. He did not recall his genotype. He did not have a biopsy. He was not treated. He cannot explain why. There are currently no symptoms referable to his history of hepatitis C, and no other  symptoms such as cryoglobulin mediated or decompensated liver disease.  With respect to risk factors for liver disease, he consumes approximately 1 beer per day, not more than that, for the last several years without a history of DWIs. He has a history of cannabis use over  a year ago. He denies any history of intravenous or intranasal drug use. He thinks he had a transfusion in the 1980s during a hernia surgery, but he cannot be certain, and it is not likely. There is no  history of body piercings, but he has a history of tattoos. There is no family history of liver disease. He believes he was vaccinated against hepatitis A and B, "at [facility], which I suspect means in prison.   PAST MEDICAL HISTORY:  Significant for Dukes B2 colon cancer resected with an ileal and right hemicolectomy on 09/11/1999.  He was followed by Oncology at [facility] until 12/25/2001 and was thereafter lost to followup. It was recommend that  he have a colonoscopy every 3 years.  He reports his last colonoscopy done in [place] a year ago, but he cannot recall the name of the person who did it.  He thinks it may have been at [facility].  Last  documented  colonoscopy was 10/18/2005 at [facility]. He has a history of hypertension. Denies any history of diabetes, dyslipidemia, or coronary artery disease.   PAST SURGICAL HISTORY:  Prostatectomy last year for prostate cancer. He reports he believes he is disease free at this last followup on 05/03/2011.  He is due to  follow up with [facility] in 06/2011. He also has a history of a right inguinal herniorrhaphy.   Past psychiatric history:  Denies.   CURRENT MEDICATIONS:  1. Lisinopril 10 mg p.o. daily. 2. Fluticasone 2 inhalations b.i.d.   Allergies:  Denies.    Smoking:  Denies.   Alcohol:  As above.   Family history:  As above.    SOCIAL HISTORY:  Divorced.  Four children.   REVIEW OF SYSTEMS:  All 10 systems reviewed today with the patient on the Review Of Systems form, which is signed and placed in the chart. His CES-D was 18.    Physical examination:  Constitutional:  Well appearing.  No significant peripheral wasting.  Vital signs:  Height 71 inches, weight 212 pounds, blood pressure 112/89, pulse 80, temperature 97.5 degrees Fahrenheit.   Ears, nose,  mouth and throat:  Unremarkable oropharynx.  No thyromegaly or neck masses.  Chest:  Resonant to percussion.  Clear to auscultation.  Cardiovascular:  Heart sounds normal S1, S2 without murmurs or rubs.   There is no peripheral edema.  Abdominal:  Normal  bowel sounds.  No masses or tenderness.  I could not appreciate a liver edge or spleen tip.  I could not appreciate any hernias.  Lymphatics:  No cervical or  inguinal lymphadenopathy.  Central Nervous System:  No asterixis or focal neurologic findings.  Dermatologic:  Anicteric without palmar  erythema or spider angiomata.  Eyes:  Anicteric sclerae.  Pupils are equal and reactive to light.   Previous Laboratory data:  10/25/2010:  INR was 0.97.  10/09/2010: Triglycerides 85. I have no other labs. Previous labs at [facility] only as recently as 2001.   assessment:  The patient is  a 55 year old gentleman with a history genotype unknown hepatitis C, naive to treatment. He appears to be a reasonable candidate for treatment. I would like to have records from [facility] as to the status of his prostate cancer, however.  In my discussion today with the patient, I discussed the nature and natural history of his hepatitis C. We discussed the significance of genotyping. I discussed the role of biopsy in genotype 1. We discussed  treatment with PEG-interferon for all genotypes and protease inhibitors and PEG-interferon and ribavirin for genotype 1. We discussed the specific system, constitutional, and psychiatric side  effects of therapy. We discussed the response rates and our treatment protocol for clinic. I discussed the risks of contagion. I did not discuss clinical trials because of his history of prostate cancer.   plan:  1. Standard labs including total A antibody, hepatitis B surface antibody and surface antigen, and hepatitis C genotyping. 2. Will not do an IL-28 until we have the genotype. 3. If genotype 1, will consider biopsy and follow up thereafter. 4. If genotype normal, will return for followup. 5. I will fax to [facility] for records. 6. Literature on hepatitis C given. 7. Tentatively to return in 2 months' time.            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  Radical prostatectomy 05/31/10, and post op XRT.  I would ask Dr Laverle Patter if Mr [facility] prostate cancer is considered cured or at least has an excellent prognosis so as to warrant therapy for his hepatitis C, which may not have an impact on his morbidity or mortality?  Genotype 2,   Hep A immune, Hep B nave.  403 .[id number]  D:  Thu May 24 21:37:07 2012 ; T:  Sat May 26 17:14:43 2012  Job #:  [id number]

## 2011-10-24 ENCOUNTER — Ambulatory Visit (AMBULATORY_SURGERY_CENTER): Payer: Medicaid Other | Admitting: *Deleted

## 2011-10-24 VITALS — Ht 71.0 in | Wt 206.8 lb

## 2011-10-24 DIAGNOSIS — Z1211 Encounter for screening for malignant neoplasm of colon: Secondary | ICD-10-CM

## 2011-10-24 DIAGNOSIS — Z85038 Personal history of other malignant neoplasm of large intestine: Secondary | ICD-10-CM

## 2011-10-24 MED ORDER — PEG-KCL-NACL-NASULF-NA ASC-C 100 G PO SOLR: ORAL | Status: DC

## 2011-10-30 ENCOUNTER — Telehealth: Payer: Self-pay | Admitting: Gastroenterology

## 2011-10-30 NOTE — Telephone Encounter (Signed)
Pt calling with severe lower abd pain that is a 7 out of 10, black tarry stools for 2 weeks.  No obvious blood.  Some diarrhea 3-4 stools per day.  No fever or chills. Pt advised to see Urgent care or ER for severe pain.  Pt agreed

## 2011-10-30 NOTE — Telephone Encounter (Signed)
YES, I AGREE 

## 2011-11-07 ENCOUNTER — Encounter: Payer: Self-pay | Admitting: Gastroenterology

## 2011-11-07 ENCOUNTER — Ambulatory Visit (AMBULATORY_SURGERY_CENTER): Payer: Medicaid Other | Admitting: Gastroenterology

## 2011-11-07 DIAGNOSIS — D126 Benign neoplasm of colon, unspecified: Secondary | ICD-10-CM

## 2011-11-07 DIAGNOSIS — Z8500 Personal history of malignant neoplasm of unspecified digestive organ: Secondary | ICD-10-CM

## 2011-11-07 DIAGNOSIS — Z86010 Personal history of colonic polyps: Secondary | ICD-10-CM

## 2011-11-07 DIAGNOSIS — Z1211 Encounter for screening for malignant neoplasm of colon: Secondary | ICD-10-CM

## 2011-11-07 DIAGNOSIS — Z8551 Personal history of malignant neoplasm of bladder: Secondary | ICD-10-CM

## 2011-11-07 MED ORDER — SODIUM CHLORIDE 0.9 % IV SOLN: 500.0000 mL | INTRAVENOUS | Status: DC

## 2011-11-07 NOTE — Progress Notes (Signed)
Patient did not experience any of the following events: a burn prior to discharge; a fall within the facility; wrong site/side/patient/procedure/implant event; or a hospital transfer or hospital admission upon discharge from the facility. (G8907) Patient did not have preoperative order for IV antibiotic SSI prophylaxis. (G8918)  

## 2011-11-07 NOTE — Patient Instructions (Signed)
Please read the handouts that your recovery room nurse gave you.    Dr. Christella Hartigan suggests that you have genetic counseling.   The staff on the 3rd floor will call you and schedule that for you.   You will receive the results of these polyps within 2 weeks.   You may resume your routine medications today.   We will see you again in 1 year for another colonoscopy.   If you have any questions, please call us at [phone]. Thank-you.

## 2011-11-12 ENCOUNTER — Telehealth: Payer: Self-pay | Admitting: *Deleted

## 2011-11-12 NOTE — Telephone Encounter (Signed)
No answer-left message on i.d. Machine.

## 2011-11-15 ENCOUNTER — Encounter: Payer: Self-pay | Admitting: Gastroenterology

## 2012-01-22 ENCOUNTER — Encounter: Payer: Self-pay | Admitting: *Deleted

## 2012-01-22 NOTE — Progress Notes (Signed)
11/08/2010 I-PSS=[id number] score=6

## 2012-02-04 ENCOUNTER — Telehealth: Payer: Self-pay | Admitting: Gastroenterology

## 2012-02-04 DIAGNOSIS — Z86010 Personal history of colonic polyps: Secondary | ICD-10-CM

## 2012-02-04 NOTE — Telephone Encounter (Signed)
Pt was given his colon results and referral to Genetics has been sent.  Recall in EPIC for 2 years for colon

## 2012-02-07 ENCOUNTER — Other Ambulatory Visit (HOSPITAL_COMMUNITY): Payer: Self-pay | Admitting: Urology

## 2012-02-07 DIAGNOSIS — C61 Malignant neoplasm of prostate: Secondary | ICD-10-CM

## 2012-02-12 ENCOUNTER — Ambulatory Visit (HOSPITAL_COMMUNITY)
Admission: RE | Admit: 2012-02-12 | Discharge: 2012-02-12 | Disposition: A | Discharge status: Home / Self Care | Payer: Medicaid Other | Source: Ambulatory Visit | Attending: Urology | Admitting: Urology

## 2012-02-12 DIAGNOSIS — C61 Malignant neoplasm of prostate: Secondary | ICD-10-CM | POA: Insufficient documentation

## 2012-02-12 DIAGNOSIS — K6289 Other specified diseases of anus and rectum: Secondary | ICD-10-CM | POA: Insufficient documentation

## 2012-02-12 DIAGNOSIS — Z923 Personal history of irradiation: Secondary | ICD-10-CM | POA: Insufficient documentation

## 2012-02-12 DIAGNOSIS — Z9079 Acquired absence of other genital organ(s): Secondary | ICD-10-CM | POA: Insufficient documentation

## 2012-02-12 MED ORDER — LORAZEPAM 2 MG/ML IJ SOLN
0.5000 mg | Freq: Once | INTRAMUSCULAR | Status: AC
  Administered 2012-02-12: 0.5 mg via INTRAVENOUS
  Filled 2012-02-12: qty 0.25

## 2012-02-12 MED ORDER — GADOBENATE DIMEGLUMINE 529 MG/ML IV SOLN
20.0000 mL | Freq: Once | INTRAVENOUS | Status: AC | PRN
  Administered 2012-02-12: 20 mL via INTRAVENOUS

## 2012-02-12 MED ORDER — LORAZEPAM 2 MG/ML IJ SOLN
INTRAMUSCULAR | Status: AC
  Filled 2012-02-12: qty 1

## 2012-02-14 ENCOUNTER — Inpatient Hospital Stay (HOSPITAL_COMMUNITY): Admission: RE | Admit: 2012-02-14 | Payer: Medicaid Other | Source: Ambulatory Visit

## 2012-03-25 ENCOUNTER — Telehealth: Payer: Self-pay | Admitting: Genetic Counselor

## 2012-03-25 NOTE — Telephone Encounter (Signed)
Not able to reach pt re appt for genetics. Pt's phone d/c. Referred by Dr. Christella Hartigan @ .

## 2013-02-25 ENCOUNTER — Other Ambulatory Visit: Payer: Self-pay | Admitting: Urology

## 2013-02-25 DIAGNOSIS — N6320 Unspecified lump in the left breast, unspecified quadrant: Secondary | ICD-10-CM

## 2013-02-25 DIAGNOSIS — N644 Mastodynia: Secondary | ICD-10-CM

## 2013-02-25 DIAGNOSIS — Z8042 Family history of malignant neoplasm of prostate: Secondary | ICD-10-CM

## 2013-03-09 ENCOUNTER — Ambulatory Visit
Admission: RE | Admit: 2013-03-09 | Discharge: 2013-03-09 | Disposition: A | Discharge status: Home / Self Care | Payer: Medicaid Other | Source: Ambulatory Visit | Attending: Urology | Admitting: Urology

## 2013-03-09 DIAGNOSIS — N6320 Unspecified lump in the left breast, unspecified quadrant: Secondary | ICD-10-CM

## 2013-03-09 DIAGNOSIS — Z8042 Family history of malignant neoplasm of prostate: Secondary | ICD-10-CM

## 2013-03-09 DIAGNOSIS — N644 Mastodynia: Secondary | ICD-10-CM

## 2013-08-07 ENCOUNTER — Encounter: Payer: Self-pay | Admitting: Gastroenterology

## 2013-08-18 ENCOUNTER — Encounter: Payer: Self-pay | Admitting: Gastroenterology

## 2013-09-25 ENCOUNTER — Encounter: Payer: Self-pay | Admitting: Gastroenterology

## 2013-11-09 ENCOUNTER — Ambulatory Visit (AMBULATORY_SURGERY_CENTER): Payer: Medicaid Other | Admitting: *Deleted

## 2013-11-09 VITALS — Ht 71.0 in | Wt 208.8 lb

## 2013-11-09 DIAGNOSIS — Z85038 Personal history of other malignant neoplasm of large intestine: Secondary | ICD-10-CM

## 2013-11-09 MED ORDER — MOVIPREP 100 G PO SOLR: ORAL | Status: DC

## 2013-11-09 NOTE — Progress Notes (Signed)
No allergies to eggs or soy. No problems with anesthesia.  

## 2013-11-24 ENCOUNTER — Ambulatory Visit (AMBULATORY_SURGERY_CENTER): Payer: Medicaid Other | Admitting: Gastroenterology

## 2013-11-24 ENCOUNTER — Encounter: Payer: Self-pay | Admitting: Gastroenterology

## 2013-11-24 ENCOUNTER — Telehealth: Payer: Self-pay

## 2013-11-24 VITALS — BP 113/84 | HR 92 | Temp 97.7°F | Resp 17 | Ht 71.0 in | Wt 208.0 lb

## 2013-11-24 DIAGNOSIS — Z800 Family history of malignant neoplasm of digestive organs: Secondary | ICD-10-CM

## 2013-11-24 DIAGNOSIS — K5730 Diverticulosis of large intestine without perforation or abscess without bleeding: Secondary | ICD-10-CM

## 2013-11-24 DIAGNOSIS — D126 Benign neoplasm of colon, unspecified: Secondary | ICD-10-CM

## 2013-11-24 DIAGNOSIS — Z85038 Personal history of other malignant neoplasm of large intestine: Secondary | ICD-10-CM

## 2013-11-24 MED ORDER — SODIUM CHLORIDE 0.9 % IV SOLN: 500.0000 mL | INTRAVENOUS | Status: DC

## 2013-11-24 NOTE — Progress Notes (Signed)
Patient did not have preoperative order for IV antibiotic SSI prophylaxis. (G8918)  Patient did not experience any of the following events: a burn prior to discharge; a fall within the facility; wrong site/side/patient/procedure/implant event; or a hospital transfer or hospital admission upon discharge from the facility. (G8907)  

## 2013-11-24 NOTE — Telephone Encounter (Signed)
My office will refer you to genetic councilor at [facility]  given your strong personal and family history of colon cancer.  Referral has been made.

## 2013-11-24 NOTE — Op Note (Signed)
[facility] [address] [place], [address]   COLONOSCOPY PROCEDURE REPORT  PATIENT: Jose, Ferguson  MR#: [id number] BIRTHDATE: [birth year] , 57  yrs. old GENDER: Male ENDOSCOPIST: Rachael Fee, MD PROCEDURE DATE:  11/24/2013 PROCEDURE:   Colonoscopy with biopsy First Screening Colonoscopy - Avg.  risk and is 50 yrs.  old or older - No.  Prior Negative Screening - Now for repeat screening. N/A  History of Adenoma - Now for follow-up colonoscopy & has been > or = to 3 yrs.  No.  It has been less than 3 yrs since last colonoscopy.  Medical reason.  Polyps Removed Today? Yes. ASA CLASS:   Class II INDICATIONS:Sister had colon cancer; he also had colon cancer remotely (s/p right hemicolectomy), 2011 colonoscopy with 30 polyps, 2012 colonoscoyp with 7 polyps; have referred to genetic councilor. MEDICATIONS: Fentanyl 75 mcg IV, Versed 8 mg IV, and These medications were titrated to patient response per physician's verbal order  DESCRIPTION OF PROCEDURE:   After the risks benefits and alternatives of the procedure were thoroughly explained, informed consent was obtained.  A digital rectal exam revealed no abnormalities of the rectum.   The [id number]  endoscope was introduced through the anus and advanced to the surgical anastomosis in right colon. No adverse events experienced.   The quality of the prep was good.  The instrument was then slowly withdrawn as the colon was fully examined.   COLON FINDINGS: The right hemicolectomy anastomosis was normal appearing.  One small polyp was found, removed and sent to pathology.  This was in descending segment, sessile, 2mm across, removed with biopsy forceps.  There were several small diverticululm in the left colon.  The examination was otherwise normal.  Retroflexed views revealed no abnormalities. The time to cecum=NA.  Withdrawal time=NA.  The scope was withdrawn and the procedure completed. COMPLICATIONS:  There were no complications.  ENDOSCOPIC IMPRESSION: The right hemicolectomy anastomosis was normal appearing.  One small polyp was found, removed and sent to pathology.  This was in descending segment, sessile, 2mm across, removed with biopsy forceps.  There were several small diverticululm in the left colon. The examination was otherwise normal.  RECOMMENDATIONS: 1. Given your significant family history of colon cancer, personal history of colon cancer and personal history of numerous pre-cancerous polyps, you should have a repeat colonoscopy in 2 years even if the polyp removed today is not pre-cancerous.  You will receive a letter within 1-2 weeks with the results of your biopsy as well as final recommendations.  Please call my office if you have not received a letter after 3 weeks. 2. My office will refer you to genetic councilor at [facility] given your strong personal and family history of colon cancer.   eSigned:  Rachael Fee, MD 11/24/2013 10:51 AM

## 2013-11-24 NOTE — Patient Instructions (Signed)
YOU HAD AN ENDOSCOPIC PROCEDURE TODAY AT THE [facility]: Refer to the procedure report that was given to you for any specific questions about what was found during the examination.  If the procedure report does not answer your questions, please call your gastroenterologist to clarify.  If you requested that your care partner not be given the details of your procedure findings, then the procedure report has been included in a sealed envelope for you to review at your convenience later.  YOU SHOULD EXPECT: Some feelings of bloating in the abdomen. Passage of more gas than usual.  Walking can help get rid of the air that was put into your GI tract during the procedure and reduce the bloating. If you had a lower endoscopy (such as a colonoscopy or flexible sigmoidoscopy) you may notice spotting of blood in your stool or on the toilet paper. If you underwent a bowel prep for your procedure, then you may not have a normal bowel movement for a few days.  DIET: Your first meal following the procedure should be a light meal and then it is ok to progress to your normal diet.  A half-sandwich or bowl of soup is an example of a good first meal.  Heavy or fried foods are harder to digest and may make you feel nauseous or bloated.  Likewise meals heavy in dairy and vegetables can cause extra gas to form and this can also increase the bloating.  Drink plenty of fluids but you should avoid alcoholic beverages for 24 hours.  ACTIVITY: Your care partner should take you home directly after the procedure.  You should plan to take it easy, moving slowly for the rest of the day.  You can resume normal activity the day after the procedure however you should NOT DRIVE or use heavy machinery for 24 hours (because of the sedation medicines used during the test).    SYMPTOMS TO REPORT IMMEDIATELY: A gastroenterologist can be reached at any hour.  During normal business hours, 8:30 AM to 5:00 PM Monday through Friday,  call [phone].  After hours and on weekends, please call the GI answering service at [phone] who will take a message and have the physician on call contact you.   Following lower endoscopy (colonoscopy or flexible sigmoidoscopy):  Excessive amounts of blood in the stool  Significant tenderness or worsening of abdominal pains  Swelling of the abdomen that is new, acute  Fever of 100F or higher  FOLLOW UP: If any biopsies were taken you will be contacted by phone or by letter within the next 1-3 weeks.  Call your gastroenterologist if you have not heard about the biopsies in 3 weeks.  Our staff will call the home number listed on your records the next business day following your procedure to check on you and address any questions or concerns that you may have at that time regarding the information given to you following your procedure. This is a courtesy call and so if there is no answer at the home number and we have not heard from you through the emergency physician on call, we will assume that you have returned to your regular daily activities without incident.  SIGNATURES/CONFIDENTIALITY: You and/or your care partner have signed paperwork which will be entered into your electronic medical record.  These signatures attest to the fact that that the information above on your After Visit Summary has been reviewed and is understood.  Full responsibility of the confidentiality of this   discharge information lies with you and/or your care-partner.  Recommendations See procedure report  

## 2013-11-24 NOTE — Progress Notes (Signed)
Patient with irregular pulse. Strip obtained , bigeminal PVC. Patient denies chest pain, shortness of breath , patient is asymptomatic. Informed Dr. Christella Hartigan, will proceed with procedure.

## 2013-11-25 ENCOUNTER — Telehealth: Payer: Self-pay

## 2013-11-25 NOTE — Telephone Encounter (Signed)
No answer, left message

## 2013-11-26 ENCOUNTER — Telehealth: Payer: Self-pay | Admitting: Genetic Counselor

## 2013-11-26 NOTE — Telephone Encounter (Signed)
LVOM FOR PT TO RETURN CALL IN RE TO REFERRAL.  °

## 2013-12-01 ENCOUNTER — Encounter: Payer: Self-pay | Admitting: Gastroenterology

## 2014-01-21 ENCOUNTER — Other Ambulatory Visit (HOSPITAL_COMMUNITY): Payer: Self-pay | Admitting: Urology

## 2014-01-21 DIAGNOSIS — C61 Malignant neoplasm of prostate: Secondary | ICD-10-CM

## 2014-02-09 ENCOUNTER — Ambulatory Visit (HOSPITAL_COMMUNITY): Payer: Medicaid Other

## 2014-02-09 ENCOUNTER — Encounter (HOSPITAL_COMMUNITY): Payer: Medicaid Other

## 2014-02-18 ENCOUNTER — Encounter (HOSPITAL_COMMUNITY)
Admission: RE | Admit: 2014-02-18 | Discharge: 2014-02-18 | Disposition: A | Discharge status: Home / Self Care | Payer: Medicaid Other | Source: Ambulatory Visit | Attending: Urology | Admitting: Urology

## 2014-02-18 DIAGNOSIS — C61 Malignant neoplasm of prostate: Secondary | ICD-10-CM | POA: Insufficient documentation

## 2014-02-18 MED ORDER — TECHNETIUM TC 99M MEDRONATE IV KIT
26.0000 | PACK | Freq: Once | INTRAVENOUS | Status: AC | PRN
  Administered 2014-02-18: 26 via INTRAVENOUS

## 2014-10-25 ENCOUNTER — Emergency Department (HOSPITAL_COMMUNITY): Payer: No Typology Code available for payment source

## 2014-10-25 ENCOUNTER — Encounter (HOSPITAL_COMMUNITY): Payer: Self-pay | Admitting: Emergency Medicine

## 2014-10-25 ENCOUNTER — Emergency Department (HOSPITAL_COMMUNITY)
Admission: EM | Admit: 2014-10-25 | Discharge: 2014-10-25 | Disposition: A | Discharge status: Home / Self Care | Payer: No Typology Code available for payment source | Attending: Emergency Medicine | Admitting: Emergency Medicine

## 2014-10-25 DIAGNOSIS — S161XXA Strain of muscle, fascia and tendon at neck level, initial encounter: Secondary | ICD-10-CM | POA: Diagnosis not present

## 2014-10-25 DIAGNOSIS — M545 Low back pain, unspecified: Secondary | ICD-10-CM

## 2014-10-25 DIAGNOSIS — Z8546 Personal history of malignant neoplasm of prostate: Secondary | ICD-10-CM | POA: Diagnosis not present

## 2014-10-25 DIAGNOSIS — Z8709 Personal history of other diseases of the respiratory system: Secondary | ICD-10-CM | POA: Insufficient documentation

## 2014-10-25 DIAGNOSIS — Z791 Long term (current) use of non-steroidal anti-inflammatories (NSAID): Secondary | ICD-10-CM | POA: Insufficient documentation

## 2014-10-25 DIAGNOSIS — I10 Essential (primary) hypertension: Secondary | ICD-10-CM | POA: Diagnosis not present

## 2014-10-25 DIAGNOSIS — Y9389 Activity, other specified: Secondary | ICD-10-CM | POA: Insufficient documentation

## 2014-10-25 DIAGNOSIS — S199XXA Unspecified injury of neck, initial encounter: Secondary | ICD-10-CM | POA: Diagnosis present

## 2014-10-25 DIAGNOSIS — Y92410 Unspecified street and highway as the place of occurrence of the external cause: Secondary | ICD-10-CM | POA: Diagnosis not present

## 2014-10-25 DIAGNOSIS — Z87891 Personal history of nicotine dependence: Secondary | ICD-10-CM | POA: Insufficient documentation

## 2014-10-25 DIAGNOSIS — M5136 Other intervertebral disc degeneration, lumbar region: Secondary | ICD-10-CM | POA: Diagnosis not present

## 2014-10-25 DIAGNOSIS — S299XXA Unspecified injury of thorax, initial encounter: Secondary | ICD-10-CM | POA: Diagnosis not present

## 2014-10-25 DIAGNOSIS — S0990XA Unspecified injury of head, initial encounter: Secondary | ICD-10-CM | POA: Insufficient documentation

## 2014-10-25 DIAGNOSIS — F4310 Post-traumatic stress disorder, unspecified: Secondary | ICD-10-CM | POA: Insufficient documentation

## 2014-10-25 DIAGNOSIS — Y998 Other external cause status: Secondary | ICD-10-CM | POA: Diagnosis not present

## 2014-10-25 DIAGNOSIS — Z7951 Long term (current) use of inhaled steroids: Secondary | ICD-10-CM | POA: Diagnosis not present

## 2014-10-25 DIAGNOSIS — Z79899 Other long term (current) drug therapy: Secondary | ICD-10-CM | POA: Insufficient documentation

## 2014-10-25 DIAGNOSIS — S3992XA Unspecified injury of lower back, initial encounter: Secondary | ICD-10-CM | POA: Insufficient documentation

## 2014-10-25 DIAGNOSIS — Z85038 Personal history of other malignant neoplasm of large intestine: Secondary | ICD-10-CM | POA: Insufficient documentation

## 2014-10-25 MED ORDER — METAXALONE 800 MG PO TABS: 800.0000 mg | ORAL_TABLET | Freq: Three times a day (TID) | ORAL | Status: DC

## 2014-10-25 MED ORDER — OXYCODONE-ACETAMINOPHEN 5-325 MG PO TABS
1.0000 | ORAL_TABLET | Freq: Once | ORAL | Status: AC
  Administered 2014-10-25: 1 via ORAL
  Filled 2014-10-25: qty 1

## 2014-10-25 MED ORDER — OXYCODONE-ACETAMINOPHEN 5-325 MG PO TABS: 1.0000 | ORAL_TABLET | Freq: Once | ORAL | Status: DC

## 2014-10-25 MED ORDER — ONDANSETRON 4 MG PO TBDP
8.0000 mg | ORAL_TABLET | Freq: Once | ORAL | Status: AC
  Administered 2014-10-25: 8 mg via ORAL
  Filled 2014-10-25: qty 2
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
## 2014-10-25 NOTE — ED Notes (Signed)
NP at BS.

## 2014-10-25 NOTE — Discharge Instructions (Signed)
Please make an appointment with your PCP for follow up

## 2015-06-23 ENCOUNTER — Emergency Department (HOSPITAL_COMMUNITY)
Admission: EM | Admit: 2015-06-23 | Discharge: 2015-06-23 | Disposition: A | Discharge status: Home / Self Care | Payer: Non-veteran care | Attending: Emergency Medicine | Admitting: Emergency Medicine

## 2015-06-23 ENCOUNTER — Encounter (HOSPITAL_COMMUNITY): Payer: Self-pay | Admitting: *Deleted

## 2015-06-23 ENCOUNTER — Emergency Department (HOSPITAL_COMMUNITY): Payer: Non-veteran care

## 2015-06-23 DIAGNOSIS — Z8659 Personal history of other mental and behavioral disorders: Secondary | ICD-10-CM | POA: Diagnosis not present

## 2015-06-23 DIAGNOSIS — Z79899 Other long term (current) drug therapy: Secondary | ICD-10-CM | POA: Insufficient documentation

## 2015-06-23 DIAGNOSIS — Y9389 Activity, other specified: Secondary | ICD-10-CM | POA: Insufficient documentation

## 2015-06-23 DIAGNOSIS — Z85038 Personal history of other malignant neoplasm of large intestine: Secondary | ICD-10-CM | POA: Insufficient documentation

## 2015-06-23 DIAGNOSIS — W540XXA Bitten by dog, initial encounter: Secondary | ICD-10-CM | POA: Insufficient documentation

## 2015-06-23 DIAGNOSIS — Y9289 Other specified places as the place of occurrence of the external cause: Secondary | ICD-10-CM | POA: Diagnosis not present

## 2015-06-23 DIAGNOSIS — S61552A Open bite of left wrist, initial encounter: Secondary | ICD-10-CM | POA: Insufficient documentation

## 2015-06-23 DIAGNOSIS — Z87891 Personal history of nicotine dependence: Secondary | ICD-10-CM | POA: Diagnosis not present

## 2015-06-23 DIAGNOSIS — Y998 Other external cause status: Secondary | ICD-10-CM | POA: Insufficient documentation

## 2015-06-23 DIAGNOSIS — S51852A Open bite of left forearm, initial encounter: Secondary | ICD-10-CM | POA: Insufficient documentation

## 2015-06-23 DIAGNOSIS — I10 Essential (primary) hypertension: Secondary | ICD-10-CM | POA: Insufficient documentation

## 2015-06-23 DIAGNOSIS — S91031A Puncture wound without foreign body, right ankle, initial encounter: Secondary | ICD-10-CM | POA: Insufficient documentation

## 2015-06-23 DIAGNOSIS — Z7951 Long term (current) use of inhaled steroids: Secondary | ICD-10-CM | POA: Diagnosis not present

## 2015-06-23 MED ORDER — AMOXICILLIN-POT CLAVULANATE 875-125 MG PO TABS: 1.0000 | ORAL_TABLET | Freq: Two times a day (BID) | ORAL | Status: DC

## 2015-06-23 MED ORDER — HYDROCODONE-ACETAMINOPHEN 5-325 MG PO TABS: 1.0000 | ORAL_TABLET | Freq: Four times a day (QID) | ORAL | Status: DC | PRN

## 2015-06-23 MED ORDER — HYDROMORPHONE HCL 1 MG/ML IJ SOLN
1.0000 mg | Freq: Once | INTRAMUSCULAR | Status: AC
  Administered 2015-06-23: 1 mg via INTRAMUSCULAR
  Filled 2015-06-23: qty 1

## 2015-06-23 MED ORDER — LIDOCAINE-EPINEPHRINE (PF) 2 %-1:200000 IJ SOLN
20.0000 mL | Freq: Once | INTRAMUSCULAR | Status: AC
  Administered 2015-06-23: 20 mL
  Filled 2015-06-23: qty 20

## 2015-06-23 NOTE — ED Notes (Signed)
Pt arrives from home with a dog bite to the left wrist. Bleeding controlled. Pt states all of dogs shots are up to date.

## 2015-06-23 NOTE — ED Notes (Signed)
Pt verbalizes understanding of d/c instructions and denies any further need at this time. 

## 2015-06-23 NOTE — Discharge Instructions (Signed)
Return here as needed.  Follow-up with your doctor.  Have the sutures out in 10-14 days.  Keep the areas clean and dry

## 2015-06-23 NOTE — ED Provider Notes (Signed)
CSN: [id number]     Arrival date & time 06/23/15  1609 History   First MD Initiated Contact with Patient 06/23/15 1617     Chief Complaint  Patient presents with  . Hand Injury    left wrist dog bite     (Consider location/radiation/quality/duration/timing/severity/associated sxs/prior Treatment) HPI Patient presents to the emergency department with a dog bite following an incident with 2 of his dogs.  The patient states that he is to break up 2 of his dogs and one of his dogs latched onto his wrist and would not let go.  The patient states that he had to stab the dog in the neck and kill it before it would like to go of his arm.  Patient states that there was blood squirting out of his arm.  Patient states he applied pressure and came to the emergency department.  The patient denies any other injuries.  Patient states that he does have a small bite to his right ankle.  There are 2 bites to his left wrist and forearm.  Patient denies fever, nausea, vomiting, weakness, dizziness, headache, blurred vision, or syncope Past Medical History  Diagnosis Date  . Hypertension   . Seasonal allergies   . Cancer     colon cancer 2001, prostate cancer 2011  . PTSD (post-traumatic stress disorder)    Past Surgical History  Procedure Laterality Date  . Colon resection  2001  . Prostatectomy  October 2011  . Laceration hand  1980    right hand  . Laceration leg  1982    left leg   Family History  Problem Relation Age of Onset  . Colon cancer Father 68  . Colon cancer Sister 46  . Stomach cancer Neg Hx    History  Substance Use Topics  . Smoking status: Former Smoker    Quit date: 10/23/2009  . Smokeless tobacco: Former Systems developer    Quit date: 10/23/2009  . Alcohol Use: 8.4 oz/week    14 Cans of beer per week    Review of Systems  All other systems negative except as documented in the HPI. All pertinent positives and negatives as reviewed in the HPI.  Allergies  Review of patient's  allergies indicates no known allergies.  Home Medications   Prior to Admission medications   Medication Sig Start Date End Date Taking? Authorizing Provider  fluticasone (FLONASE) 50 MCG/ACT nasal spray 1 spray by Nasal route 2 (two) times daily.     Yes Historical Provider, MD  lisinopril (PRINIVIL,ZESTRIL) 40 MG tablet Take 40 mg by mouth daily.   Yes Historical Provider, MD  Naproxen (NAPROSYN PO) Take 1 tablet by mouth daily as needed (pain).    Yes Historical Provider, MD  oxyCODONE-acetaminophen (PERCOCET/ROXICET) 5-325 MG per tablet Take 1 tablet by mouth once. 10/25/14  Yes Junius Creamer, NP  sertraline (ZOLOFT) 100 MG tablet Take 100 mg by mouth daily.   Yes Historical Provider, MD  lisinopril (PRINIVIL,ZESTRIL) 10 MG tablet Take 40 mg by mouth daily.     Historical Provider, MD  metaxalone (SKELAXIN) 800 MG tablet Take 1 tablet (800 mg total) by mouth 3 (three) times daily. Patient not taking: Reported on 06/23/2015 10/25/14   Junius Creamer, NP   BP 149/92 mmHg  Pulse 80  Temp(Src) 98.8 F (37.1 C) (Oral)  Resp 16  SpO2 97% Physical Exam  Constitutional: He is oriented to person, place, and time. He appears well-developed and well-nourished. No distress.  HENT:  Head: Normocephalic  and atraumatic.  Eyes: Pupils are equal, round, and reactive to light.  Cardiovascular: Regular rhythm.   Pulmonary/Chest: Effort normal.  Musculoskeletal:       Arms:      Feet:  Neurological: He is alert and oriented to person, place, and time. He exhibits normal muscle tone. Coordination normal.  Skin: Skin is warm and dry.  Psychiatric: He has a normal mood and affect. His behavior is normal.  Nursing note and vitals reviewed.   ED Course  Procedures (including critical care time) Labs Review Labs Reviewed - No data to display  Imaging Review Dg Forearm Left  06/23/2015   CLINICAL DATA:  Animal bite, was bitten while trying to separate 2 dogs, bite in LEFT forearm, arterial bleeding  currently controlled, history hypertension  EXAM: LEFT FOREARM - 2 VIEW  COMPARISON:  None  FINDINGS: Osseous mineralization normal.  Joint spaces preserved.  No acute fracture, dislocation or bone destruction.  Scattered soft tissue swelling distal forearm with overlying dressing artifacts.  Scattered soft tissue gas throughout volar aspect of forearm.  No definite radiopaque foreign body.  IMPRESSION: Scattered soft tissue gas.  No acute bony abnormalities or definite radiopaque foreign body.   Electronically Signed   By: Lavonia Dana M.D.   On: 06/23/2015 17:13    The patient's wounds were copiously irrigated.  There was one stitch placed in the central portion of the 2 cm laceration from the dog bite is was loosely approximated to allow for any possible infectious process to escape.  Patient is placed on Augmentin and advised follow-up with his primary care doctor, told to have the suture out in 10 days.  LACERATION REPAIR Performed by: Brent General Authorized by: Brent General Consent: Verbal consent obtained. Risks and benefits: risks, benefits and alternatives were discussed Consent given by: patient Patient identity confirmed: provided demographic data Prepped and Draped in normal sterile fashion Wound explored  Laceration Location: Left lateral forearm distally  Laceration Length: 2 cm  No Foreign Bodies seen or palpated  Anesthesia: local infiltration  Local anesthetic: lidocaine 2 % with epinephrine  Anesthetic total: 5 ml  Irrigation method: syringe Amount of cleaning: standard  Skin closure: 4-0 prolene   Number of sutures: 1   Technique: Simple interrupted   Patient tolerance: Patient tolerated the procedure well with no immediate complications.   Dalia Heading, PA-C 06/25/15 3545  Ernestina Patches, MD 06/25/15 [phone]

## 2015-10-20 ENCOUNTER — Encounter: Payer: Self-pay | Admitting: Gastroenterology

## 2015-10-21 ENCOUNTER — Encounter: Payer: Self-pay | Admitting: Gastroenterology

## 2016-11-23 ENCOUNTER — Telehealth (HOSPITAL_COMMUNITY): Payer: Self-pay

## 2016-11-23 NOTE — Telephone Encounter (Signed)
Called patient in regards to Pulmonary Rehab referral from Dr. Gwenette Greet at the [facility]. Left message. Will f/u at later time.

## 2016-11-29 ENCOUNTER — Telehealth (HOSPITAL_COMMUNITY): Payer: Self-pay | Admitting: *Deleted

## 2017-01-21 ENCOUNTER — Ambulatory Visit (HOSPITAL_COMMUNITY): Payer: Non-veteran care

## 2017-02-01 ENCOUNTER — Inpatient Hospital Stay (HOSPITAL_COMMUNITY)
Admission: RE | Admit: 2017-02-01 | Discharge: 2017-02-01 | Disposition: A | Discharge status: Home / Self Care | Payer: Non-veteran care | Source: Ambulatory Visit

## 2017-02-01 NOTE — Progress Notes (Signed)
Pulmonary Rehab Progress Note: Patient arrived to his pulmonary rehab orientation appointment as scheduled. He expressed anger that [facility] yesterday to report cancellation of today's [facility]. Patient left message with secretary yesterday that he needed to cancel today's appointment in pulmonary rehab due to a [place] appointment. [facility] called [facility] and spoke with community care case manager. Case Manager stated patient did not have appointment for today. She called patient to make sure patient was clear that he would be able to attend pulmonary rehab today. At today's pulmonary rehab session patient expressed extreme dissatisfaction with the close communication between the [place] and pulmonary rehab. He stated "it is my choice to come to [facility]". "I chose you". "They did not choose this for me". "I don't have to come" "Im not going to come and cancel the whole thing". Patient handed RN "homework" papers that WERE NOT filled out although patient has had over a month to complete them and he walked out. RN notified community care case Freight forwarder at the [place]. Will close patients pulmonary rehab chart.

## 2018-07-07 ENCOUNTER — Encounter (HOSPITAL_COMMUNITY): Payer: Self-pay | Admitting: Emergency Medicine

## 2018-07-07 ENCOUNTER — Emergency Department (HOSPITAL_COMMUNITY): Payer: Non-veteran care

## 2018-07-07 ENCOUNTER — Emergency Department (HOSPITAL_COMMUNITY)
Admission: EM | Admit: 2018-07-07 | Discharge: 2018-07-07 | Disposition: A | Discharge status: Home / Self Care | Payer: Non-veteran care | Attending: Emergency Medicine | Admitting: Emergency Medicine

## 2018-07-07 DIAGNOSIS — R1031 Right lower quadrant pain: Secondary | ICD-10-CM | POA: Insufficient documentation

## 2018-07-07 DIAGNOSIS — K625 Hemorrhage of anus and rectum: Secondary | ICD-10-CM | POA: Insufficient documentation

## 2018-07-07 DIAGNOSIS — Z8546 Personal history of malignant neoplasm of prostate: Secondary | ICD-10-CM | POA: Insufficient documentation

## 2018-07-07 DIAGNOSIS — Z79899 Other long term (current) drug therapy: Secondary | ICD-10-CM | POA: Diagnosis not present

## 2018-07-07 DIAGNOSIS — I10 Essential (primary) hypertension: Secondary | ICD-10-CM | POA: Insufficient documentation

## 2018-07-07 DIAGNOSIS — Z87891 Personal history of nicotine dependence: Secondary | ICD-10-CM | POA: Diagnosis not present

## 2018-07-07 DIAGNOSIS — Z85038 Personal history of other malignant neoplasm of large intestine: Secondary | ICD-10-CM | POA: Diagnosis not present

## 2018-07-07 LAB — CBC WITH DIFFERENTIAL/PLATELET
Basophils Absolute: 0 10*3/uL (ref 0.0–0.1)
Basophils Relative: 0 %
Eosinophils Absolute: 0.2 10*3/uL (ref 0.0–0.7)
Eosinophils Relative: 2 %
HEMATOCRIT: 42.1 % (ref 39.0–52.0)
HEMOGLOBIN: 14 g/dL (ref 13.0–17.0)
LYMPHS ABS: 2.3 10*3/uL (ref 0.7–4.0)
Lymphocytes Relative: 29 %
MCH: 29.7 pg (ref 26.0–34.0)
MCHC: 33.3 g/dL (ref 30.0–36.0)
MCV: 89.4 fL (ref 78.0–100.0)
MONO ABS: 0.5 10*3/uL (ref 0.1–1.0)
MONOS PCT: 7 %
NEUTROS ABS: 5 10*3/uL (ref 1.7–7.7)
NEUTROS PCT: 62 %
Platelets: 192 10*3/uL (ref 150–400)
RBC: 4.71 MIL/uL (ref 4.22–5.81)
RDW: 13.4 % (ref 11.5–15.5)
WBC: 8 10*3/uL (ref 4.0–10.5)

## 2018-07-07 LAB — COMPREHENSIVE METABOLIC PANEL
ALK PHOS: 83 U/L (ref 38–126)
ALT: 19 U/L (ref 0–44)
AST: 24 U/L (ref 15–41)
Albumin: 3.8 g/dL (ref 3.5–5.0)
Anion gap: 9 (ref 5–15)
BILIRUBIN TOTAL: 0.9 mg/dL (ref 0.3–1.2)
BUN: 13 mg/dL (ref 8–23)
CALCIUM: 9.3 mg/dL (ref 8.9–10.3)
CO2: 25 mmol/L (ref 22–32)
CREATININE: 0.96 mg/dL (ref 0.61–1.24)
Chloride: 104 mmol/L (ref 98–111)
GFR calc non Af Amer: >60 mL/min (ref >60)
GLUCOSE: 111 mg/dL — AB (ref 70–99)
Potassium: 4.4 mmol/L (ref 3.5–5.1)
SODIUM: 138 mmol/L (ref 135–145)
TOTAL PROTEIN: 6.8 g/dL (ref 6.5–8.1)

## 2018-07-07 LAB — TYPE AND SCREEN
ABO/RH(D): A POS
Antibody Screen: NEGATIVE

## 2018-07-07 LAB — POC OCCULT BLOOD, ED: Fecal Occult Bld: POSITIVE — AB

## 2018-07-07 LAB — LIPASE, BLOOD: Lipase: 29 U/L (ref 11–51)

## 2018-07-07 MED ORDER — IOPAMIDOL (ISOVUE-300) INJECTION 61%
INTRAVENOUS | Status: AC
  Filled 2018-07-07: qty 100

## 2018-07-07 MED ORDER — HYDROMORPHONE HCL 1 MG/ML IJ SOLN
1.0000 mg | Freq: Once | INTRAMUSCULAR | Status: AC
  Administered 2018-07-07: 1 mg via INTRAVENOUS
  Filled 2018-07-07: qty 1

## 2018-07-07 MED ORDER — ONDANSETRON HCL 4 MG/2ML IJ SOLN
4.0000 mg | Freq: Once | INTRAMUSCULAR | Status: AC
  Administered 2018-07-07: 4 mg via INTRAVENOUS
  Filled 2018-07-07: qty 2

## 2018-07-07 MED ORDER — HYDROCODONE-ACETAMINOPHEN 5-325 MG PO TABS: 1.0000 | ORAL_TABLET | Freq: Four times a day (QID) | ORAL | 0 refills | Status: AC | PRN

## 2018-07-07 MED ORDER — IOPAMIDOL (ISOVUE-300) INJECTION 61%
100.0000 mL | Freq: Once | INTRAVENOUS | Status: AC | PRN
Start: 2018-07-07 — End: 2018-07-07
  Administered 2018-07-07: 100 mL via INTRAVENOUS

## 2018-07-07 MED ORDER — DIPHENHYDRAMINE HCL 50 MG/ML IJ SOLN
25.0000 mg | Freq: Once | INTRAMUSCULAR | Status: AC
  Administered 2018-07-07: 25 mg via INTRAVENOUS
  Filled 2018-07-07: qty 1

## 2018-07-07 MED ORDER — METOCLOPRAMIDE HCL 5 MG/ML IJ SOLN
10.0000 mg | Freq: Once | INTRAMUSCULAR | Status: AC
  Administered 2018-07-07: 10 mg via INTRAVENOUS
  Filled 2018-07-07: qty 2

## 2018-07-07 MED ORDER — SODIUM CHLORIDE 0.9 % IV BOLUS
1000.0000 mL | Freq: Once | INTRAVENOUS | Status: AC
  Administered 2018-07-07: 1000 mL via INTRAVENOUS

## 2018-07-07 MED ORDER — MORPHINE SULFATE (PF) 4 MG/ML IV SOLN
4.0000 mg | Freq: Once | INTRAVENOUS | Status: AC
  Administered 2018-07-07: 4 mg via INTRAVENOUS
  Filled 2018-07-07: qty 1

## 2018-07-07 NOTE — ED Triage Notes (Signed)
Pt c/o right mid abd pains with blood in stool that started last night. Pt reports stools are formed.

## 2018-07-07 NOTE — Discharge Instructions (Addendum)
Follow up with your gastroenterologist at the [facility]. Return to the Emergency Department if symptoms are getting worse.

## 2018-07-07 NOTE — ED Provider Notes (Signed)
[facility] DEPT Provider Note   CSN: [id number] Arrival date & time: 07/07/18  1420     History   Chief Complaint Chief Complaint  Patient presents with  . Abdominal Pain  . Rectal Bleeding    HPI Jose Ferguson is a 62 y.o. male.  The history is provided by the patient.  Abdominal Pain   Associated symptoms include hematochezia.  Rectal Bleeding  Associated symptoms: abdominal pain   He has history of hypertension, colon cancer, prostate cancer and comes in with right mid and lower abdominal pain which started last night.  Pain is cramping he rates it at 8-9/10.  It seems to be better when he walks around, worse if he lays down.  Pain is crampy in nature and does not radiate.  He did take a dose of milk of magnesia and did have a bowel movement which did not affect the pain.  He did notice blood in the stool which seem to be on the outside of the stool and was bright red.  He had rectal bleeding in the past with his colon cancer, but none since then.  He has not done anything else to treat the pain.  There has been associated nausea without vomiting.  He denies fever or chills.  Denies any urinary symptoms.  Past Medical History:  Diagnosis Date  . Cancer [facility])    colon cancer 2001, prostate cancer 2011  . Hypertension   . PTSD (post-traumatic stress disorder)   . Seasonal allergies     Patient Active Problem List   Diagnosis Date Noted  . COLONIC POLYPS, HX OF 10/03/2010  . HEPATITIS C 07/11/2010  . HYPERTENSION 07/11/2010  . CHRONIC RHINITIS 07/11/2010  . HEMATURIA UNSPECIFIED 07/11/2010  . FREQUENCY, URINARY 07/11/2010  . COLON CANCER, HX OF 07/11/2010  . PROSTATE CANCER, HX OF 07/11/2010    Past Surgical History:  Procedure Laterality Date  . COLON RESECTION  2001  . laceration hand  1980   right hand  . laceration leg  1982   left leg  . PROSTATECTOMY  October 2011        Home Medications    Prior to Admission  medications   Medication Sig Start Date End Date Taking? Authorizing Provider  amoxicillin-clavulanate (AUGMENTIN) 875-125 MG per tablet Take 1 tablet by mouth every 12 (twelve) hours. 06/23/15   Lawyer, Harrell Gave, PA-C  fluticasone (FLONASE) 50 MCG/ACT nasal spray 1 spray by Nasal route 2 (two) times daily.      [provider]  HYDROcodone-acetaminophen (NORCO/VICODIN) 5-325 MG per tablet Take 1 tablet by mouth every 6 (six) hours as needed for moderate pain. 06/23/15   Lawyer, Harrell Gave, PA-C  lisinopril (PRINIVIL,ZESTRIL) 10 MG tablet Take 40 mg by mouth daily.     [provider]  lisinopril (PRINIVIL,ZESTRIL) 40 MG tablet Take 40 mg by mouth daily.    [provider]  metaxalone (SKELAXIN) 800 MG tablet Take 1 tablet (800 mg total) by mouth 3 (three) times daily. Patient not taking: Reported on 06/23/2015 10/25/14   Junius Creamer, NP  Naproxen (NAPROSYN PO) Take 1 tablet by mouth daily as needed (pain).     [provider]  oxyCODONE-acetaminophen (PERCOCET/ROXICET) 5-325 MG per tablet Take 1 tablet by mouth once. 10/25/14   Junius Creamer, NP  sertraline (ZOLOFT) 100 MG tablet Take 100 mg by mouth daily.    [provider]    Family History Family History  Problem Relation Age of Onset  .  Colon cancer Father 48  . Colon cancer Sister 20  . Stomach cancer Neg Hx     Social History Social History   Tobacco Use  . Smoking status: Former Smoker    Last attempt to quit: 10/23/2009    Years since quitting: 8.7  . Smokeless tobacco: Former Systems developer    Quit date: 10/23/2009  Substance Use Topics  . Alcohol use: Yes    Alcohol/week: 8.4 oz    Types: 14 Cans of beer per week  . Drug use: No     Allergies   Patient has no known allergies.   Review of Systems Review of Systems  Gastrointestinal: Positive for abdominal pain and hematochezia.  All other systems reviewed and are negative.    Physical Exam Updated Vital Signs BP 121/89 (BP  Location: Right Arm)   Pulse 80   Temp (!) 97.5 F (36.4 C) (Oral)   Resp 18   Ht 5\' 11"  (1.803 m)   Wt 97.5 kg (215 lb)   SpO2 98%   BMI 29.99 kg/m   Physical Exam  Nursing note and vitals reviewed.  62 year old male, resting comfortably and in no acute distress. Vital signs are normal. Oxygen saturation is 98%, which is normal. Head is normocephalic and atraumatic. PERRLA, EOMI. Oropharynx is clear. Neck is nontender and supple without adenopathy or JVD. Back is nontender and there is no CVA tenderness. Lungs are clear without rales, wheezes, or rhonchi. Chest is nontender. Heart has regular rate and rhythm without murmur. Abdomen is soft, flat, with mild to moderate tenderness in the right mid and lower abdomen.  There is no rebound or guarding.  There are no masses or hepatosplenomegaly and peristalsis is hypoactive. Rectal: Normal sphincter tone, no masses.  Small amount of brown stool present. Extremities have no cyanosis or edema, full range of motion is present. Skin is warm and dry without rash. Neurologic: Mental status is normal, cranial nerves are intact, there are no motor or sensory deficits.  ED Treatments / Results  Labs (all labs ordered are listed, but only abnormal results are displayed) Labs Reviewed  COMPREHENSIVE METABOLIC PANEL - Abnormal; Notable for the following components:      Result Value   Glucose, Bld 111 (*)    All other components within normal limits  POC OCCULT BLOOD, ED - Abnormal; Notable for the following components:   Fecal Occult Bld POSITIVE (*)    All other components within normal limits  CBC WITH DIFFERENTIAL/PLATELET  LIPASE, BLOOD  POC OCCULT BLOOD, ED  TYPE AND SCREEN   Radiology Ct Abdomen Pelvis W Contrast  Result Date: 07/07/2018 CLINICAL DATA:  Right-sided mid abdominal pain with hematochezia. EXAM: CT ABDOMEN AND PELVIS WITH CONTRAST TECHNIQUE: Multidetector CT imaging of the abdomen and pelvis was performed using the  standard protocol following bolus administration of intravenous contrast. CONTRAST:  168mL ISOVUE-300 IOPAMIDOL (ISOVUE-300) INJECTION 61% COMPARISON:  CT abdomen pelvis dated February 10, 2014. FINDINGS: Lower chest: No acute abnormality. Unchanged 5 mm pulmonary nodule in the left lower lobe, benign. The previously seen 9 mm nodule in the right lower lobe is longer identified. Hepatobiliary: No focal liver abnormality is seen. No gallstones, gallbladder wall thickening, or biliary dilatation. Pancreas: Unremarkable. No pancreatic ductal dilatation or surrounding inflammatory changes. Spleen: Normal in size. Unchanged 1.1 cm hypodense lesion in the anterior spleen, stable since 2015, consistent with benign etiology. Adrenals/Urinary Tract: Adrenal glands are unremarkable. Kidneys are normal, without renal calculi, focal lesion, or hydronephrosis.  Bladder is unremarkable. Stomach/Bowel: Circumferential wall thickening of the gastric antrum near the pylorus. Prior right hemicolectomy. No bowel wall thickening, distention, or surrounding inflammatory changes. Vascular/Lymphatic: Mild bilateral iliac atherosclerosis. No enlarged abdominal or pelvic lymph nodes. Resolved left pelvic lymphadenopathy. Reproductive: Prior prostatectomy. Other: No abdominal wall hernia or abnormality. No abdominopelvic ascites. No pneumoperitoneum. Musculoskeletal: No acute or significant osseous findings. Progressed degenerative changes of the lower lumbar spine. IMPRESSION: 1. Circumferential wall thickening of the gastric antrum near the pylorus may reflect gastritis. Consider upper endoscopy for further evaluation. 2. Prior right hemicolectomy without evidence of recurrent tumor. 3. Prior prostatectomy.  Resolved left-sided pelvic lymphadenopathy. Electronically Signed   By: Titus Dubin M.D.   On: 07/07/2018 17:57    Procedures Procedures (including critical care time)  Medications Ordered in ED Medications - No data to  display   Initial Impression / Assessment and Plan / ED Course  I have reviewed the triage vital signs and the nursing notes.  Pertinent labs & imaging results that were available during my care of the patient were reviewed by me and considered in my medical decision making (see chart for details).  Abdominal pain of uncertain cause.  Patient states that he had hemicolectomy for his colon cancer, but does not know if it was right or left.  Old records are reviewed, and I cannot find any reference to his prior colon surgery.  He states was done at [facility] in 2001, so it was before computer records were established.  Stool Hemoccult is positive.  Initial CBC shows normal WBC and normal hemoglobin.  Last hemoglobin on record was from 2012.  Will send for CT of abdomen and pelvis.  Diagnostic possibilities include appendicitis, diverticulitis, ureterolithiasis, pain from adhesions.  He will be given morphine and ondansetron.  CT scan shows evidence of prior right hemicolectomy.  Some thickening of the gastric antrum does not correlate with his location of pain or tenderness and is not felt to be relevant.  He initially had little relief with morphine and was given a dose of hydromorphone.  He is feeling comfortable at this point.  Advised that he would need to follow-up with his gastroenterologist.  He does report recent colonoscopy.  He may benefit from upper endoscopy as well.  He is given prescription for small number of hydrocodone-acetaminophen tablets and advised to return if symptoms worsen.  Otherwise, follow-up with his gastroenterologist.  Final Clinical Impressions(s) / ED Diagnoses   Final diagnoses:  RLQ abdominal pain  Rectal bleeding    ED Discharge Orders        Ordered    HYDROcodone-acetaminophen (NORCO/VICODIN) 5-325 MG tablet  Every 6 hours PRN     93/26/71 2458       Delora Fuel, MD 09/98/33 2045

## 2021-03-22 ENCOUNTER — Other Ambulatory Visit: Payer: Self-pay | Admitting: Neurosurgery

## 2021-03-22 DIAGNOSIS — M5416 Radiculopathy, lumbar region: Secondary | ICD-10-CM

## 2021-04-02 ENCOUNTER — Ambulatory Visit
Admission: RE | Admit: 2021-04-02 | Discharge: 2021-04-02 | Disposition: A | Discharge status: Home / Self Care | Payer: Non-veteran care | Source: Ambulatory Visit | Attending: Neurosurgery | Admitting: Neurosurgery

## 2021-04-02 ENCOUNTER — Other Ambulatory Visit: Payer: Self-pay

## 2021-04-02 DIAGNOSIS — M5416 Radiculopathy, lumbar region: Secondary | ICD-10-CM

## 2021-04-06 ENCOUNTER — Other Ambulatory Visit: Payer: Self-pay | Admitting: Neurosurgery

## 2021-04-06 ENCOUNTER — Other Ambulatory Visit (HOSPITAL_COMMUNITY): Payer: Self-pay | Admitting: Neurosurgery

## 2021-04-06 DIAGNOSIS — M5416 Radiculopathy, lumbar region: Secondary | ICD-10-CM

## 2021-04-14 ENCOUNTER — Ambulatory Visit (HOSPITAL_COMMUNITY)
Admission: RE | Admit: 2021-04-14 | Discharge: 2021-04-14 | Disposition: A | Discharge status: Home / Self Care | Payer: No Typology Code available for payment source | Source: Ambulatory Visit | Attending: Neurosurgery | Admitting: Neurosurgery

## 2021-04-14 ENCOUNTER — Other Ambulatory Visit: Payer: Self-pay

## 2021-04-14 DIAGNOSIS — M5416 Radiculopathy, lumbar region: Secondary | ICD-10-CM | POA: Insufficient documentation

## 2021-04-29 IMAGING — CT CT L SPINE W/O CM
3 series · 12 of 33 positions shown, 14 images · non-contrast
Comparison: MRI 10/25/2014

CLINICAL DATA: Lumbar region back pain for the last 5 years. Left
leg weakness and numbness.

EXAM:
CT LUMBAR SPINE WITHOUT CONTRAST
TECHNIQUE: Multidetector CT imaging of the lumbar spine was performed without
intravenous contrast administration. Multiplanar CT image
reconstructions were also generated.

[Series 4: l spine soft · axial · 0.38mm/px · z∈[+1228,+1416]mm · 4 of 138 slices shown, 5 images]
[im 22/138  soft-tissue]
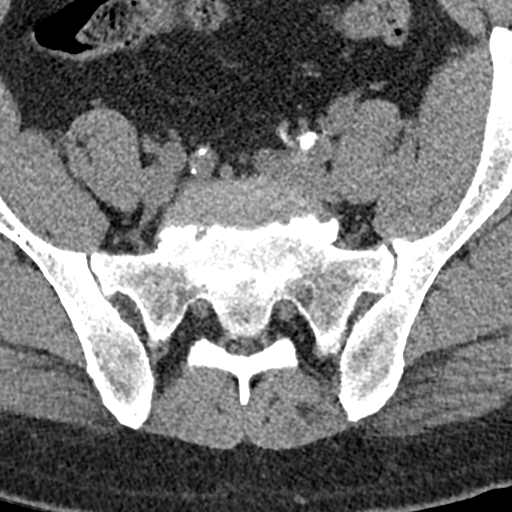
[im 22/138  bone]
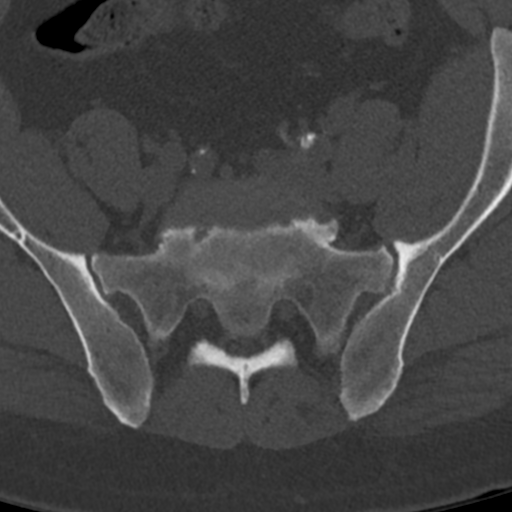
[im 53/138  bone]
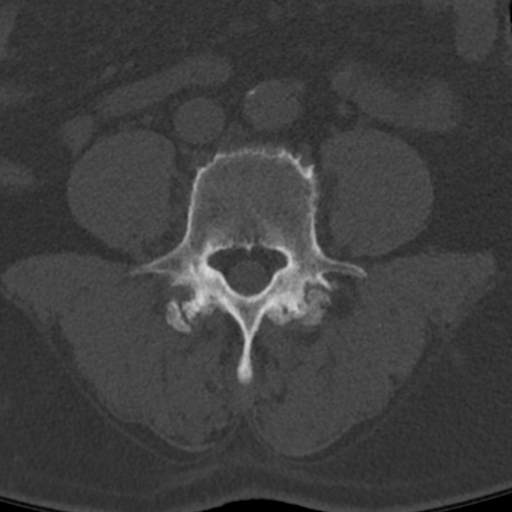
[im 85/138  bone]
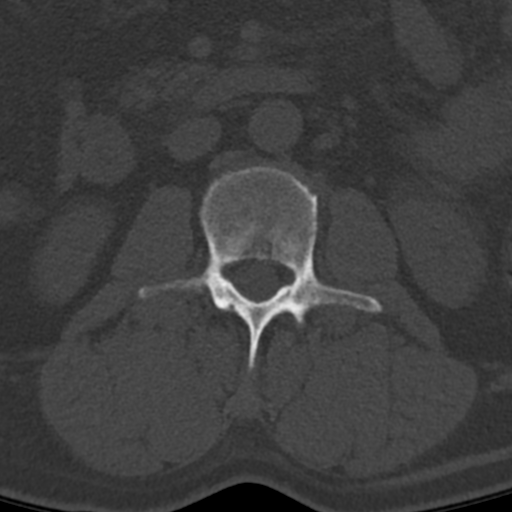
[im 116/138  bone]
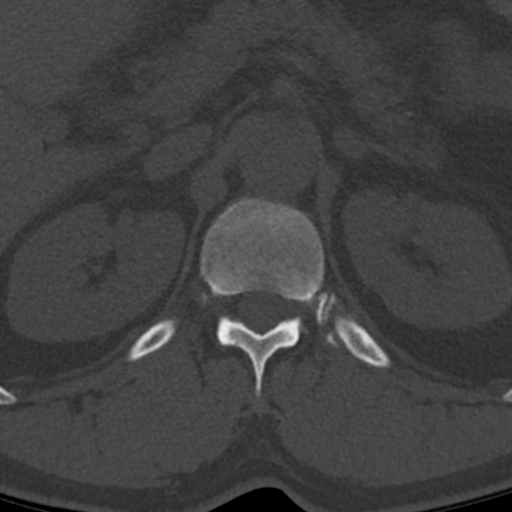

[Series 6: coronal bone · coronal · 0.34mm/px · 3 of 61 slices shown]
[im 13/61  bone]
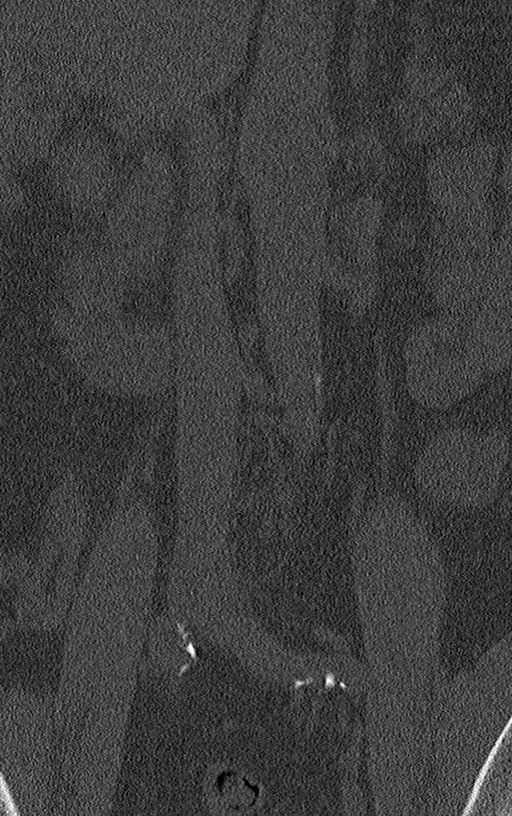
[im 25/61  bone]
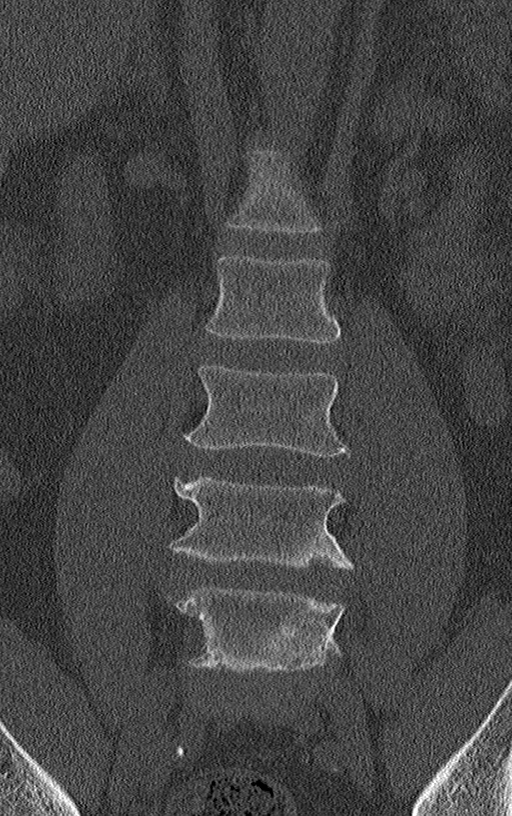
[im 37/61  bone]
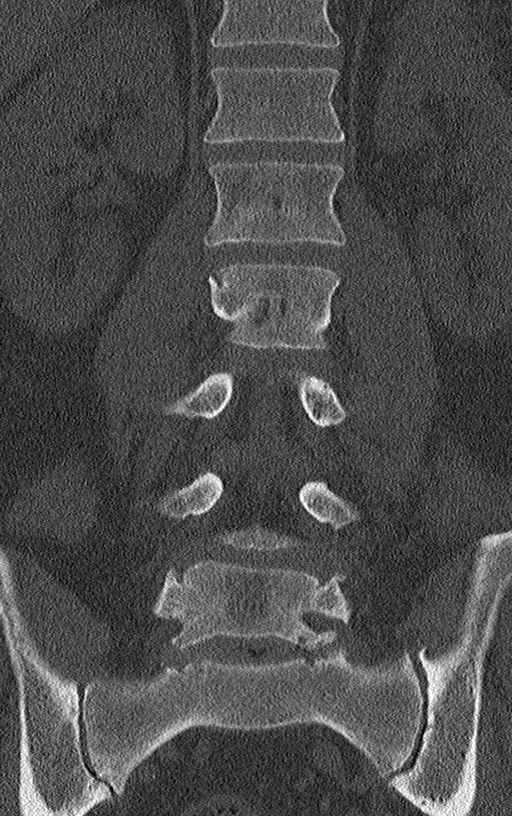

[Series 7: sagittal bone · sagittal · 0.42mm/px · 5 of 61 slices shown, 6 images]
[im 21/61  bone]
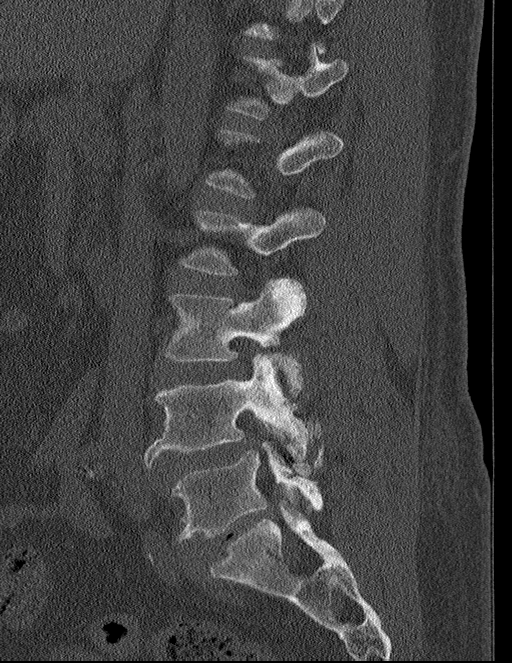
[im 26/61  bone]
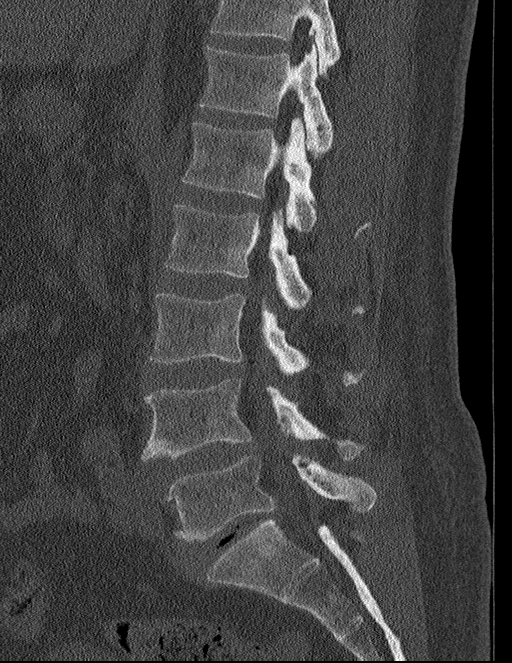
[im 31/61  soft-tissue]
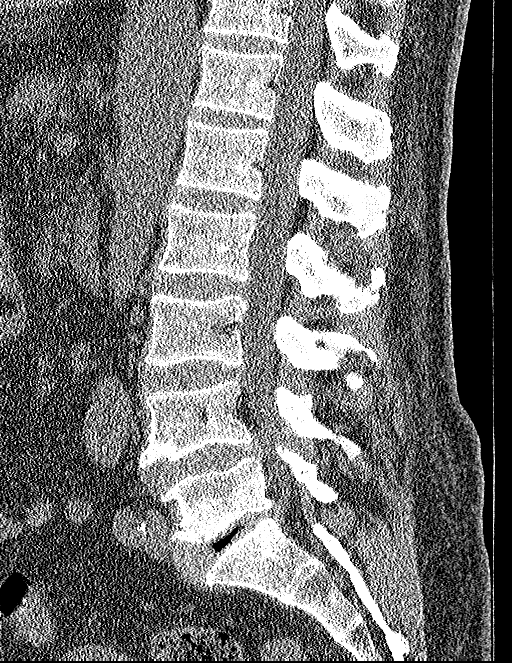
[im 31/61  bone]
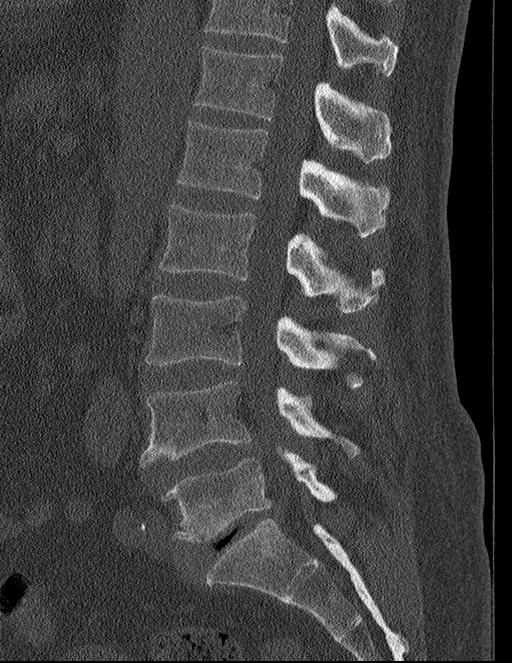
[im 36/61  bone]
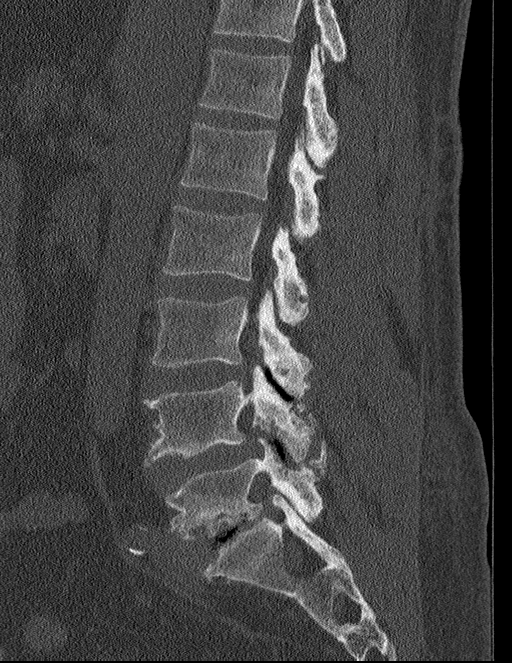
[im 41/61  bone]
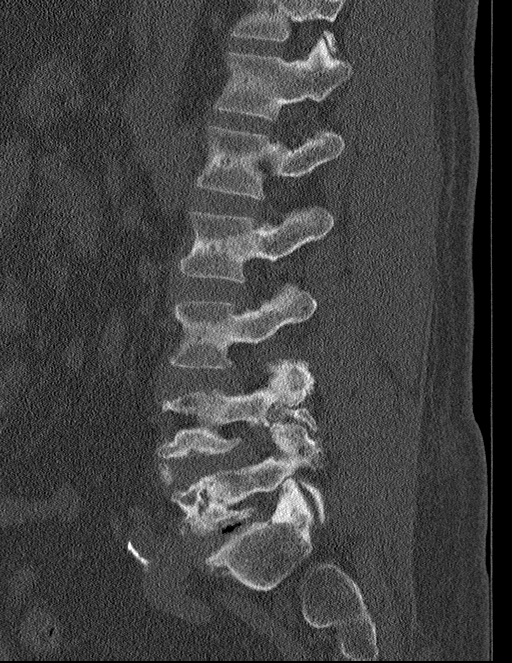

[12 of 33 positions shown; findings below may reference images not displayed]

FINDINGS: Segmentation: 5 lumbar type vertebral bodies.

Alignment: 3 mm degenerative anterolisthesis at L4-5. This would
likely worsen with standing or flexion based on the morphology of
the facet arthropathy described below.

Vertebrae: No fracture or primary bone lesion. Chronic discogenic
endplate changes at L5-S1 which could be associated with low back
pain.

Paraspinal and other soft tissues: Negative

Disc levels: No abnormality of significance at L2-3 or above.

L3-4: Minimal bulging of the disc. Bilateral facet osteoarthritis
with vacuum phenomenon. Mild narrowing of the lateral recesses and
foramina but without visible neural compression. This appearance
could worsen with standing or flexion.

L4-5: Advanced bilateral facet arthropathy with gaping joints and
vacuum phenomenon. 3 mm of anterolisthesis in this position, likely
to worsen with standing or flexion. Disc degeneration with
circumferential protrusion. Multifactorial spinal stenosis at this
level that could cause neural compression on either or both sides.
The appearance would likely worsen with standing or flexion.

L5-S1: Disc degeneration with vacuum phenomenon and mild bulging.
Mild facet degeneration and hypertrophy. Mild stenosis of the
subarticular lateral recesses and neural foramina but without
definite neural compression. Discogenic endplate changes likely
associated with low back pain.
IMPRESSION: L3-4: Minimal disc bulge. Bilateral facet arthropathy with vacuum
phenomenon. No compressive stenosis in this position. The appearance
could worsen with standing or flexion.

L4-5: Severe multifactorial spinal stenosis. Advanced bilateral
facet arthropathy with gaping joints and vacuum phenomenon. 3 mm
degenerative anterolisthesis. Circumferential protrusion of the
disc. Neural compression could occur on either or both sides. The
appearance would likely worsen with standing or flexion.

L5-S1: Advanced disc degeneration. Mild bulging of the disc. Mild
facet hypertrophy. Mild narrowing of the lateral recesses and
foramina but without definite neural compression. Discogenic
endplate changes likely associated with low back pain.

## 2021-08-05 DIAGNOSIS — M129 Arthropathy, unspecified: Secondary | ICD-10-CM | POA: Diagnosis not present

## 2021-08-05 DIAGNOSIS — E669 Obesity, unspecified: Secondary | ICD-10-CM | POA: Diagnosis not present

## 2021-08-05 DIAGNOSIS — M549 Dorsalgia, unspecified: Secondary | ICD-10-CM | POA: Diagnosis not present

## 2021-08-05 DIAGNOSIS — Z1159 Encounter for screening for other viral diseases: Secondary | ICD-10-CM | POA: Diagnosis not present

## 2021-08-05 DIAGNOSIS — Z131 Encounter for screening for diabetes mellitus: Secondary | ICD-10-CM | POA: Diagnosis not present

## 2021-08-05 DIAGNOSIS — E559 Vitamin D deficiency, unspecified: Secondary | ICD-10-CM | POA: Diagnosis not present

## 2021-08-05 DIAGNOSIS — M542 Cervicalgia: Secondary | ICD-10-CM | POA: Diagnosis not present

## 2021-08-05 DIAGNOSIS — G8929 Other chronic pain: Secondary | ICD-10-CM | POA: Diagnosis not present

## 2021-08-05 DIAGNOSIS — Z9181 History of falling: Secondary | ICD-10-CM | POA: Diagnosis not present

## 2021-08-05 DIAGNOSIS — Z79899 Other long term (current) drug therapy: Secondary | ICD-10-CM | POA: Diagnosis not present

## 2021-08-08 DIAGNOSIS — Z79899 Other long term (current) drug therapy: Secondary | ICD-10-CM | POA: Diagnosis not present

## 2021-08-24 DIAGNOSIS — G8929 Other chronic pain: Secondary | ICD-10-CM | POA: Diagnosis not present

## 2021-08-24 DIAGNOSIS — M542 Cervicalgia: Secondary | ICD-10-CM | POA: Diagnosis not present

## 2021-08-24 DIAGNOSIS — M549 Dorsalgia, unspecified: Secondary | ICD-10-CM | POA: Diagnosis not present

## 2021-08-24 DIAGNOSIS — R7303 Prediabetes: Secondary | ICD-10-CM | POA: Diagnosis not present

## 2021-08-24 DIAGNOSIS — Z9181 History of falling: Secondary | ICD-10-CM | POA: Diagnosis not present

## 2021-08-24 DIAGNOSIS — Z79899 Other long term (current) drug therapy: Secondary | ICD-10-CM | POA: Diagnosis not present

## 2021-08-24 DIAGNOSIS — E669 Obesity, unspecified: Secondary | ICD-10-CM | POA: Diagnosis not present

## 2021-08-28 DIAGNOSIS — Z79899 Other long term (current) drug therapy: Secondary | ICD-10-CM | POA: Diagnosis not present

## 2021-09-21 DIAGNOSIS — G8929 Other chronic pain: Secondary | ICD-10-CM | POA: Diagnosis not present

## 2021-09-21 DIAGNOSIS — Z9181 History of falling: Secondary | ICD-10-CM | POA: Diagnosis not present

## 2021-09-21 DIAGNOSIS — Z79899 Other long term (current) drug therapy: Secondary | ICD-10-CM | POA: Diagnosis not present

## 2021-09-21 DIAGNOSIS — M549 Dorsalgia, unspecified: Secondary | ICD-10-CM | POA: Diagnosis not present

## 2021-09-21 DIAGNOSIS — M542 Cervicalgia: Secondary | ICD-10-CM | POA: Diagnosis not present

## 2021-09-21 DIAGNOSIS — E669 Obesity, unspecified: Secondary | ICD-10-CM | POA: Diagnosis not present

## 2021-10-20 DIAGNOSIS — Z79899 Other long term (current) drug therapy: Secondary | ICD-10-CM | POA: Diagnosis not present

## 2021-10-20 DIAGNOSIS — Z9181 History of falling: Secondary | ICD-10-CM | POA: Diagnosis not present

## 2021-10-20 DIAGNOSIS — Z0000 Encounter for general adult medical examination without abnormal findings: Secondary | ICD-10-CM | POA: Diagnosis not present

## 2021-10-20 DIAGNOSIS — G8929 Other chronic pain: Secondary | ICD-10-CM | POA: Diagnosis not present

## 2021-10-20 DIAGNOSIS — M549 Dorsalgia, unspecified: Secondary | ICD-10-CM | POA: Diagnosis not present

## 2021-10-20 DIAGNOSIS — M542 Cervicalgia: Secondary | ICD-10-CM | POA: Diagnosis not present

## 2021-10-20 DIAGNOSIS — E669 Obesity, unspecified: Secondary | ICD-10-CM | POA: Diagnosis not present

## 2021-10-23 DIAGNOSIS — Z79899 Other long term (current) drug therapy: Secondary | ICD-10-CM | POA: Diagnosis not present

## 2022-07-13 ENCOUNTER — Encounter (HOSPITAL_COMMUNITY): Payer: Self-pay | Admitting: Emergency Medicine

## 2022-07-13 ENCOUNTER — Ambulatory Visit (HOSPITAL_COMMUNITY)
Admission: EM | Admit: 2022-07-13 | Discharge: 2022-07-13 | Disposition: A | Discharge status: Home / Self Care | Payer: No Typology Code available for payment source | Attending: Internal Medicine | Admitting: Internal Medicine

## 2022-07-13 ENCOUNTER — Ambulatory Visit (INDEPENDENT_AMBULATORY_CARE_PROVIDER_SITE_OTHER): Payer: No Typology Code available for payment source

## 2022-07-13 DIAGNOSIS — R0602 Shortness of breath: Secondary | ICD-10-CM

## 2022-07-13 DIAGNOSIS — R053 Chronic cough: Secondary | ICD-10-CM

## 2022-07-13 MED ORDER — BENZONATATE 100 MG PO CAPS: 100.0000 mg | ORAL_CAPSULE | Freq: Three times a day (TID) | ORAL | 0 refills | Status: DC | PRN

## 2022-07-13 MED ORDER — DOXYCYCLINE HYCLATE 100 MG PO CAPS: 100.0000 mg | ORAL_CAPSULE | Freq: Two times a day (BID) | ORAL | 0 refills | Status: DC

## 2022-07-13 MED ORDER — PREDNISONE 20 MG PO TABS: 40.0000 mg | ORAL_TABLET | Freq: Every day | ORAL | 0 refills | Status: AC

## 2022-07-13 MED ORDER — ALBUTEROL SULFATE (2.5 MG/3ML) 0.083% IN NEBU
INHALATION_SOLUTION | RESPIRATORY_TRACT | Status: AC
  Filled 2022-07-13: qty 3

## 2022-07-13 MED ORDER — METHYLPREDNISOLONE SODIUM SUCC 125 MG IJ SOLR
80.0000 mg | Freq: Once | INTRAMUSCULAR | Status: AC
  Administered 2022-07-13: 80 mg via INTRAMUSCULAR

## 2022-07-13 MED ORDER — ALBUTEROL SULFATE (2.5 MG/3ML) 0.083% IN NEBU
2.5000 mg | INHALATION_SOLUTION | Freq: Once | RESPIRATORY_TRACT | Status: AC
  Administered 2022-07-13: 2.5 mg via RESPIRATORY_TRACT

## 2022-07-13 MED ORDER — METHYLPREDNISOLONE SODIUM SUCC 125 MG IJ SOLR
INTRAMUSCULAR | Status: AC
  Filled 2022-07-13: qty 2

## 2022-07-13 NOTE — ED Provider Notes (Signed)
[place]    CSN: [id number] Arrival date & time: 07/13/22  1031      History   Chief Complaint Chief Complaint  Patient presents with   Shortness of Breath   Cough    HPI Jose Ferguson is a 66 y.o. male.   Patient presents with shortness of breath and productive cough that has been present for about 2 weeks.  Patient reports that it has worsened over the past few days.  Denies any associated upper respiratory symptoms or fever.  He reports history of COPD and emphysema.  He takes albuterol as needed and Stiolto inhaler daily.  He reports no improvement with rescue inhaler. Denies chest pain.    Shortness of Breath Cough   Past Medical History:  Diagnosis Date   Cancer ([place])    colon cancer 2001, prostate cancer 2011   Hypertension    PTSD (post-traumatic stress disorder)    Seasonal allergies     Patient Active Problem List   Diagnosis Date Noted   COLONIC POLYPS, HX OF 10/03/2010   HEPATITIS C 07/11/2010   HYPERTENSION 07/11/2010   CHRONIC RHINITIS 07/11/2010   HEMATURIA UNSPECIFIED 07/11/2010   FREQUENCY, URINARY 07/11/2010   COLON CANCER, HX OF 07/11/2010   PROSTATE CANCER, HX OF 07/11/2010    Past Surgical History:  Procedure Laterality Date   COLON RESECTION  2001   laceration hand  1980   right hand   laceration leg  1982   left leg   PROSTATECTOMY  October 2011       Home Medications    Prior to Admission medications   Medication Sig Start Date End Date Taking? Authorizing Provider  benzonatate (TESSALON) 100 MG capsule Take 1 capsule (100 mg total) by mouth every 8 (eight) hours as needed for cough. 07/13/22  Yes Djeneba Barsch, Michele Rockers, FNP  doxycycline (VIBRAMYCIN) 100 MG capsule Take 1 capsule (100 mg total) by mouth 2 (two) times daily. 07/13/22  Yes Lashunda Greis, Hildred Alamin E, FNP  predniSONE (DELTASONE) 20 MG tablet Take 2 tablets (40 mg total) by mouth daily for 5 days. 07/13/22 07/18/22 Yes Donyetta Ogletree, Michele Rockers, FNP  albuterol (PROVENTIL HFA) 108 (90  Base) MCG/ACT inhaler Inhale 2 puffs into the lungs. Every 4 to 6 hours as needed for wheezing and shortness of breath    [provider]  docusate sodium (COLACE) 100 MG capsule Take 100 mg by mouth daily as needed for constipation.    [provider]  fluticasone (FLONASE) 50 MCG/ACT nasal spray 1 spray by Nasal route 2 (two) times daily.      [provider]  gabapentin (NEURONTIN) 600 MG tablet Take 600 mg by mouth 3 (three) times daily. 01/28/18   [provider]  HYDROcodone-acetaminophen (NORCO/VICODIN) 5-325 MG tablet Take 1 tablet by mouth every 6 (six) hours as needed for moderate pain. 1/82/99   Delora Fuel, MD  leuprolide, 6 Month, (LEUPROLIDE ACETATE, 6 MONTH,) 45 MG injection Inject 45 mg into the skin. Every 6 months    [provider]  lisinopril (PRINIVIL,ZESTRIL) 40 MG tablet Take 40 mg by mouth daily.    [provider]  loratadine (CLARITIN) 10 MG tablet Take 10 mg by mouth daily.    [provider]  losartan (COZAAR) 50 MG tablet Take 50 mg by mouth daily.    [provider]  Magnesium Oxide (MAOX) 420 MG TABS Take 400 mg by mouth daily.    [provider]  methocarbamol (ROBAXIN) 500 MG  tablet Take 500 mg by mouth 2 (two) times daily.    [provider]  Naproxen (NAPROSYN PO) Take 1 tablet by mouth daily as needed (pain).     [provider]  sertraline (ZOLOFT) 100 MG tablet Take 100 mg by mouth daily.    [provider]  Tetrahydrozoline HCl (VISINE OP) Place 1 drop into both eyes daily as needed (red eye).    [provider]  tiotropium (SPIRIVA) 18 MCG inhalation capsule Place 18 mcg into inhaler and inhale daily.    [provider]    Family History Family History  Problem Relation Age of Onset   Colon cancer Father 53   Colon cancer Sister 74   Stomach cancer Neg Hx     Social History Social History   Tobacco Use   Smoking status:  Former    Types: Cigarettes    Quit date: 10/23/2009    Years since quitting: 12.7   Smokeless tobacco: Former    Quit date: 10/23/2009  Vaping Use   Vaping Use: Never used  Substance Use Topics   Alcohol use: Yes    Alcohol/week: 14.0 standard drinks of alcohol    Types: 14 Cans of beer per week   Drug use: No     Allergies   Patient has no known allergies.   Review of Systems Review of Systems Per HPI  Physical Exam Triage Vital Signs ED Triage Vitals  Enc Vitals Group     BP 07/13/22 1049 127/81     Pulse Rate 07/13/22 1049 (!) 103     Resp 07/13/22 1049 (!) 24     Temp 07/13/22 1049 98 F (36.7 C)     Temp Source 07/13/22 1049 Oral     SpO2 07/13/22 1049 96 %     Weight --      Height --      Head Circumference --      Peak Flow --      Pain Score 07/13/22 1046 0     Pain Loc --      Pain Edu? --      Excl. in [place]? --    No data found.  Updated Vital Signs BP 127/81 (BP Location: Right Arm)   Pulse (!) 103   Temp 98 F (36.7 C) (Oral)   Resp (!) 24   SpO2 96%   Visual Acuity Right Eye Distance:   Left Eye Distance:   Bilateral Distance:    Right Eye Near:   Left Eye Near:    Bilateral Near:     Physical Exam Constitutional:      General: He is not in acute distress.    Appearance: Normal appearance. He is not toxic-appearing or diaphoretic.  HENT:     Head: Normocephalic and atraumatic.  Eyes:     Extraocular Movements: Extraocular movements intact.     Conjunctiva/sclera: Conjunctivae normal.  Cardiovascular:     Rate and Rhythm: Normal rate and regular rhythm.     Pulses: Normal pulses.     Heart sounds: Normal heart sounds.  Pulmonary:     Effort: Pulmonary effort is normal. No respiratory distress.     Breath sounds: Normal breath sounds. No stridor. No wheezing, rhonchi or rales.  Neurological:     General: No focal deficit present.     Mental Status: He is alert and oriented to person, place, and time. Mental status is at  baseline.  Psychiatric:  Mood and Affect: Mood normal.        Behavior: Behavior normal.        Thought Content: Thought content normal.        Judgment: Judgment normal.      UC Treatments / Results  Labs (all labs ordered are listed, but only abnormal results are displayed) Labs Reviewed - No data to display  EKG   Radiology DG Chest 2 View  Result Date: 07/13/2022 CLINICAL DATA:  Shortness of breath EXAM: CHEST - 2 VIEW COMPARISON:  08/30/2010 FINDINGS: The heart size and mediastinal contours are within normal limits. Increased interstitial markings within the right mid lung and bilateral lung bases, right greater than left. No pleural effusion or pneumothorax. The visualized skeletal structures are unremarkable. IMPRESSION: Increased interstitial markings within the right mid lung and bilateral lung bases, right greater than left. Findings may reflect asymmetric edema versus atypical/viral infection. Electronically Signed   By: Davina Poke D.O.   On: 07/13/2022 11:18    Procedures Procedures (including critical care time)  Medications Ordered in UC Medications  methylPREDNISolone sodium succinate (SOLU-MEDROL) 125 mg/2 mL injection 80 mg (has no administration in time range)  albuterol (PROVENTIL) (2.5 MG/3ML) 0.083% nebulizer solution 2.5 mg (2.5 mg Nebulization Given 07/13/22 1123)    Initial Impression / Assessment and Plan / UC Course  I have reviewed the triage vital signs and the nursing notes.  Pertinent labs & imaging results that were available during my care of the patient were reviewed by me and considered in my medical decision making (see chart for details).     Chest x-ray showing increased interstitial markings in the right lung and lung bases which is suspicious for asymmetric edema versus atypical/viral infection.  Low suspicion for edema given patient's physical exam and history.  Highly suspicious that patient may have viral illness that is  causing COPD exacerbation.  Low concern for blood clot given physical exam and no associated chest pain or chest pressure.  Oxygen is normal and patient stated that he felt better after albuterol nebulizer treatment was administered.  No tachypnea present.  Therefore, I do think outpatient treatment is reasonable.  Will treat with doxycycline, steroids, benzonatate.  IM Solu-Medrol administered in urgent care prior to patient being discharged.  Advised patient to not start taking prednisone until tomorrow.  Patient reports that he is in remission for prostate cancer but that he still receives Lupron injections.  His last Lupron injection was multiple months ago so I do think steroids would be reasonable and safe.  Benefits outweigh risks as well.  Patient was given strict return precautions and advised to go to to the ER if shortness of breath worsens.  Patient verbalized understanding and was agreeable with plan. Final Clinical Impressions(s) / UC Diagnoses   Final diagnoses:  Shortness of breath  Persistent cough     Discharge Instructions      Your chest x-ray is showing signs of possible viral illness causing your symptoms.  I am suspicious that this could be causing a flareup of your COPD.  This is being treated with a steroid injection here in urgent care today.  You have also been sent prednisone pills to the pharmacy.  Please do not start taking these prednisone pills until tomorrow.  An antibiotic named doxycycline has been prescribed for you to take for infection as well.  Please take this medication with food to prevent nausea.  Also recommend that you wear sunscreen if you are going  to be out in the sun for prolonged periods of time as it can make you more prone to sunburn.  Benzonatate has been prescribed for you to take as needed for cough.  Please follow-up if symptoms persist or worsen.     ED Prescriptions     Medication Sig Dispense Auth. Provider   doxycycline (VIBRAMYCIN) 100  MG capsule Take 1 capsule (100 mg total) by mouth 2 (two) times daily. 20 capsule [place], [place] E, [place]   predniSONE (DELTASONE) 20 MG tablet Take 2 tablets (40 mg total) by mouth daily for 5 days. 10 tablet [place], [place] E, [place]   benzonatate (TESSALON) 100 MG capsule Take 1 capsule (100 mg total) by mouth every 8 (eight) hours as needed for cough. 21 capsule [place], Michele Rockers, [place]      PDMP not reviewed this encounter.   Teodora Medici, [place] 07/13/22 1152

## 2022-07-13 NOTE — ED Triage Notes (Addendum)
Pt has COPD and had SOB for 2 weeks. Has cough that is sometimes productive. Normally sees the [place].

## 2022-07-13 NOTE — Discharge Instructions (Addendum)
Your chest x-ray is showing signs of possible viral illness causing your symptoms.  I am suspicious that this could be causing a flareup of your COPD.  This is being treated with a steroid injection here in urgent care today.  You have also been sent prednisone pills to the pharmacy.  Please do not start taking these prednisone pills until tomorrow.  An antibiotic named doxycycline has been prescribed for you to take for infection as well.  Please take this medication with food to prevent nausea.  Also recommend that you wear sunscreen if you are going to be out in the sun for prolonged periods of time as it can make you more prone to sunburn.  Benzonatate has been prescribed for you to take as needed for cough.  Please follow-up if symptoms persist or worsen.

## 2022-07-26 ENCOUNTER — Encounter (HOSPITAL_COMMUNITY): Payer: Self-pay

## 2022-07-26 ENCOUNTER — Other Ambulatory Visit: Payer: Self-pay

## 2022-07-26 ENCOUNTER — Emergency Department (HOSPITAL_COMMUNITY): Payer: No Typology Code available for payment source

## 2022-07-26 ENCOUNTER — Observation Stay (HOSPITAL_COMMUNITY)
Admission: EM | Admit: 2022-07-26 | Discharge: 2022-07-26 | Discharge status: Against Medical Advice | Payer: No Typology Code available for payment source | Attending: Internal Medicine | Admitting: Internal Medicine

## 2022-07-26 DIAGNOSIS — I509 Heart failure, unspecified: Secondary | ICD-10-CM | POA: Diagnosis not present

## 2022-07-26 DIAGNOSIS — Z7951 Long term (current) use of inhaled steroids: Secondary | ICD-10-CM | POA: Insufficient documentation

## 2022-07-26 DIAGNOSIS — Z79899 Other long term (current) drug therapy: Secondary | ICD-10-CM | POA: Insufficient documentation

## 2022-07-26 DIAGNOSIS — Z902 Acquired absence of lung [part of]: Secondary | ICD-10-CM | POA: Insufficient documentation

## 2022-07-26 DIAGNOSIS — J449 Chronic obstructive pulmonary disease, unspecified: Secondary | ICD-10-CM | POA: Diagnosis not present

## 2022-07-26 DIAGNOSIS — R0602 Shortness of breath: Secondary | ICD-10-CM | POA: Diagnosis present

## 2022-07-26 DIAGNOSIS — Z20822 Contact with and (suspected) exposure to covid-19: Secondary | ICD-10-CM | POA: Diagnosis not present

## 2022-07-26 DIAGNOSIS — Z85118 Personal history of other malignant neoplasm of bronchus and lung: Secondary | ICD-10-CM | POA: Diagnosis not present

## 2022-07-26 HISTORY — DX: Emphysema, unspecified: J43.9

## 2022-07-26 HISTORY — DX: Chronic obstructive pulmonary disease, unspecified: J44.9

## 2022-07-26 LAB — BRAIN NATRIURETIC PEPTIDE: B Natriuretic Peptide: 699.3 pg/mL — ABNORMAL HIGH (ref 0.0–100.0)

## 2022-07-26 LAB — CBC WITH DIFFERENTIAL/PLATELET
Abs Immature Granulocytes: 0.03 10*3/uL (ref 0.00–0.07)
Basophils Absolute: 0 10*3/uL (ref 0.0–0.1)
Basophils Relative: 0 %
Eosinophils Absolute: 0.2 10*3/uL (ref 0.0–0.5)
Eosinophils Relative: 2 %
HCT: 40.3 % (ref 39.0–52.0)
Hemoglobin: 12.7 g/dL — ABNORMAL LOW (ref 13.0–17.0)
Immature Granulocytes: 0 %
Lymphocytes Relative: 27 %
Lymphs Abs: 2.8 10*3/uL (ref 0.7–4.0)
MCH: 28.9 pg (ref 26.0–34.0)
MCHC: 31.5 g/dL (ref 30.0–36.0)
MCV: 91.8 fL (ref 80.0–100.0)
Monocytes Absolute: 0.5 10*3/uL (ref 0.1–1.0)
Monocytes Relative: 5 %
Neutro Abs: 7 10*3/uL (ref 1.7–7.7)
Neutrophils Relative %: 66 %
Platelets: 212 10*3/uL (ref 150–400)
RBC: 4.39 MIL/uL (ref 4.22–5.81)
RDW: 14.6 % (ref 11.5–15.5)
WBC: 10.7 10*3/uL — ABNORMAL HIGH (ref 4.0–10.5)
nRBC: 0 % (ref 0.0–0.2)

## 2022-07-26 LAB — BASIC METABOLIC PANEL
Anion gap: 7 (ref 5–15)
BUN: 19 mg/dL (ref 8–23)
CO2: 26 mmol/L (ref 22–32)
Calcium: 9 mg/dL (ref 8.9–10.3)
Chloride: 107 mmol/L (ref 98–111)
Creatinine, Ser: 1.28 mg/dL — ABNORMAL HIGH (ref 0.61–1.24)
GFR, Estimated: >60 mL/min (ref >60)
Glucose, Bld: 141 mg/dL — ABNORMAL HIGH (ref 70–99)
Potassium: 4.5 mmol/L (ref 3.5–5.1)
Sodium: 140 mmol/L (ref 135–145)

## 2022-07-26 LAB — SARS CORONAVIRUS 2 BY RT PCR: SARS Coronavirus 2 by RT PCR: NEGATIVE

## 2022-07-26 LAB — TSH: TSH: 2.557 u[IU]/mL (ref 0.350–4.500)

## 2022-07-26 LAB — TROPONIN I (HIGH SENSITIVITY)
Troponin I (High Sensitivity): 32 ng/L — ABNORMAL HIGH (ref <18)
Troponin I (High Sensitivity): 22 ng/L — ABNORMAL HIGH (ref <18)

## 2022-07-26 MED ORDER — ACETAMINOPHEN 650 MG RE SUPP: 650.0000 mg | Freq: Four times a day (QID) | RECTAL | Status: DC | PRN

## 2022-07-26 MED ORDER — IPRATROPIUM BROMIDE 0.02 % IN SOLN
0.5000 mg | Freq: Once | RESPIRATORY_TRACT | Status: AC
  Administered 2022-07-26: 0.5 mg via RESPIRATORY_TRACT
  Filled 2022-07-26: qty 2.5

## 2022-07-26 MED ORDER — FUROSEMIDE 10 MG/ML IJ SOLN: 20.0000 mg | Freq: Two times a day (BID) | INTRAMUSCULAR | Status: DC

## 2022-07-26 MED ORDER — ONDANSETRON HCL 4 MG PO TABS: 4.0000 mg | ORAL_TABLET | Freq: Four times a day (QID) | ORAL | Status: DC | PRN

## 2022-07-26 MED ORDER — IOHEXOL 350 MG/ML SOLN
100.0000 mL | Freq: Once | INTRAVENOUS | Status: AC | PRN
  Administered 2022-07-26: 100 mL via INTRAVENOUS

## 2022-07-26 MED ORDER — SODIUM CHLORIDE (PF) 0.9 % IJ SOLN
INTRAMUSCULAR | Status: AC
  Administered 2022-07-26: 10 mL
  Filled 2022-07-26: qty 50

## 2022-07-26 MED ORDER — MAGNESIUM OXIDE -MG SUPPLEMENT 400 (240 MG) MG PO TABS: 400.0000 mg | ORAL_TABLET | Freq: Every day | ORAL | Status: DC

## 2022-07-26 MED ORDER — LOSARTAN POTASSIUM 25 MG PO TABS: 50.0000 mg | ORAL_TABLET | Freq: Every day | ORAL | Status: DC

## 2022-07-26 MED ORDER — FUROSEMIDE 10 MG/ML IJ SOLN
20.0000 mg | Freq: Once | INTRAMUSCULAR | Status: AC
  Administered 2022-07-26: 20 mg via INTRAVENOUS
  Filled 2022-07-26: qty 4

## 2022-07-26 MED ORDER — ALBUTEROL SULFATE (2.5 MG/3ML) 0.083% IN NEBU
5.0000 mg | INHALATION_SOLUTION | Freq: Once | RESPIRATORY_TRACT | Status: AC
  Administered 2022-07-26: 5 mg via RESPIRATORY_TRACT
  Filled 2022-07-26: qty 6

## 2022-07-26 MED ORDER — ENOXAPARIN SODIUM 40 MG/0.4ML IJ SOSY: 40.0000 mg | PREFILLED_SYRINGE | INTRAMUSCULAR | Status: DC

## 2022-07-26 MED ORDER — ONDANSETRON HCL 4 MG/2ML IJ SOLN: 4.0000 mg | Freq: Four times a day (QID) | INTRAMUSCULAR | Status: DC | PRN

## 2022-07-26 MED ORDER — ACETAMINOPHEN 325 MG PO TABS: 650.0000 mg | ORAL_TABLET | Freq: Four times a day (QID) | ORAL | Status: DC | PRN

## 2022-07-26 NOTE — ED Provider Triage Note (Signed)
Emergency Medicine Provider Triage Evaluation Note  Jose Ferguson , a 66 y.o. male  was evaluated in triage.  Pt complains of shortness of breath.  Patient has a history of COPD, lung cancer with left-sided lobectomy several years ago per his report.  He presented with cough to urgent care on 07/13/2022.  At that time he was treated for COPD exacerbation with steroid shot, oral doxycycline and prednisone.  He has completed these medications.  He continues to have symptoms.  States that he feels a gurgling sensation in the right lower chest.  No fevers.  Cough is nonproductive.  No sign he does report feeling and ificant lower extremity swelling or tenderness.  He does report feeling anxious about the shortness of breath.  Review of Systems  Positive: Shortness of breath, cough Negative: Fever  Physical Exam  BP (!) 136/107 (BP Location: Right Arm)   Pulse (!) 120   Temp 98 F (36.7 C) (Oral)   Resp 16   Ht '5\' 11"'$  (1.803 m)   Wt 108.9 kg   SpO2 96%   BMI 33.47 kg/m  Gen:   Awake, no distress   Resp:  Normal effort  MSK:   Moves extremities without difficulty  Other:  Tachycardic without murmur, patient with scant expiratory wheezing noted at bases  Medical Decision Making  Medically screening exam initiated at 7:30 AM.  Appropriate orders placed.  Moksh Loomer was informed that the remainder of the evaluation will be completed by another provider, this initial triage assessment does not replace that evaluation, and the importance of remaining in the ED until their evaluation is complete.     Carlisle Cater, PA-C 07/26/22 [phone]

## 2022-07-26 NOTE — ED Triage Notes (Signed)
Patient c/o SOB x 3 weeks and worsening. Patient states he has a history of emphysema and COPD.

## 2022-07-26 NOTE — ED Provider Notes (Addendum)
[facility] DEPT Provider Note   CSN: [id number] Arrival date & time: 07/26/22  6834     History  Chief Complaint  Patient presents with   Shortness of Breath    Jose Ferguson is a 66 y.o. male.  He presents this morning with complaint of shortness of breath.  Seen on arrival by myself in Triage. Patient has a history of COPD, lung cancer with right-sided lobectomy several years ago per his report.  He presented with cough to urgent care on 07/13/2022.  At that time he was treated for COPD exacerbation with steroid shot, oral doxycycline and prednisone.  He has completed these medications but continues to have symptoms.  States that he feels a gurgling sensation in the right lower chest.  No fevers.  Cough is nonproductive.  He does report feeling anxious about the shortness of breath.  Patient denies risk factors for pulmonary embolism including: unilateral leg swelling, history of DVT/PE/other blood clots, use of exogenous hormones, recent immobilizations, recent surgery, recent travel (>4hr segment), active malignancy, hemoptysis. Symptoms worse with lying flat and at night.   I spoke with the patient's wife by phone and she reports worsening exertional shortness of breath and difficulty breathing at night which is keeping him awake.        Home Medications Prior to Admission medications   Medication Sig Start Date End Date Taking? Authorizing Provider  albuterol (PROVENTIL HFA) 108 (90 Base) MCG/ACT inhaler Inhale 2 puffs into the lungs. Every 4 to 6 hours as needed for wheezing and shortness of breath    [provider]  benzonatate (TESSALON) 100 MG capsule Take 1 capsule (100 mg total) by mouth every 8 (eight) hours as needed for cough. 07/13/22   Teodora Medici, FNP  docusate sodium (COLACE) 100 MG capsule Take 100 mg by mouth daily as needed for constipation.    [provider]  doxycycline (VIBRAMYCIN) 100 MG capsule Take 1 capsule  (100 mg total) by mouth 2 (two) times daily. 07/13/22   Teodora Medici, FNP  fluticasone (FLONASE) 50 MCG/ACT nasal spray 1 spray by Nasal route 2 (two) times daily.      [provider]  gabapentin (NEURONTIN) 600 MG tablet Take 600 mg by mouth 3 (three) times daily. 01/28/18   [provider]  HYDROcodone-acetaminophen (NORCO/VICODIN) 5-325 MG tablet Take 1 tablet by mouth every 6 (six) hours as needed for moderate pain. 1/96/22   Delora Fuel, MD  leuprolide, 6 Month, (LEUPROLIDE ACETATE, 6 MONTH,) 45 MG injection Inject 45 mg into the skin. Every 6 months    [provider]  lisinopril (PRINIVIL,ZESTRIL) 40 MG tablet Take 40 mg by mouth daily.    [provider]  loratadine (CLARITIN) 10 MG tablet Take 10 mg by mouth daily.    [provider]  losartan (COZAAR) 50 MG tablet Take 50 mg by mouth daily.    [provider]  Magnesium Oxide (MAOX) 420 MG TABS Take 400 mg by mouth daily.    [provider]  methocarbamol (ROBAXIN) 500 MG tablet Take 500 mg by mouth 2 (two) times daily.    [provider]  Naproxen (NAPROSYN PO) Take 1 tablet by mouth daily as needed (pain).     [provider]  sertraline (ZOLOFT) 100 MG tablet Take 100 mg by mouth daily.    [provider]  Tetrahydrozoline HCl (VISINE OP) Place 1 drop into both eyes daily as needed (red eye).  [provider]  tiotropium (SPIRIVA) 18 MCG inhalation capsule Place 18 mcg into inhaler and inhale daily.    [provider]      Allergies    Patient has no known allergies.    Review of Systems   Review of Systems  Physical Exam Updated Vital Signs BP (!) 136/107 (BP Location: Right Arm)   Pulse (!) 120   Temp 98 F (36.7 C) (Oral)   Resp 16   Ht '5\' 11"'$  (1.803 m)   Wt 108.9 kg   SpO2 96%   BMI 33.47 kg/m   Physical Exam Vitals and nursing note reviewed.  Constitutional:      General: He is not in acute  distress.    Appearance: He is well-developed.  HENT:     Head: Normocephalic and atraumatic.  Eyes:     General:        Right eye: No discharge.        Left eye: No discharge.     Conjunctiva/sclera: Conjunctivae normal.  Neck:     Vascular: No JVD.  Cardiovascular:     Rate and Rhythm: Normal rate and regular rhythm.     Heart sounds: Normal heart sounds.  Pulmonary:     Effort: Pulmonary effort is normal.     Breath sounds: Examination of the right-lower field reveals decreased breath sounds and wheezing. Examination of the left-lower field reveals decreased breath sounds and wheezing. Decreased breath sounds and wheezing present. No rhonchi or rales.     Comments: No respiratory distress, no tachypnea. Abdominal:     Palpations: Abdomen is soft.     Tenderness: There is no abdominal tenderness.  Musculoskeletal:     Cervical back: Normal range of motion and neck supple.     Right lower leg: No tenderness. No edema.     Left lower leg: No tenderness. No edema.  Skin:    General: Skin is warm and dry.  Neurological:     Mental Status: He is alert.     ED Results / Procedures / Treatments   Labs (all labs ordered are listed, but only abnormal results are displayed) Labs Reviewed  CBC WITH DIFFERENTIAL/PLATELET - Abnormal; Notable for the following components:      Result Value   WBC 10.7 (*)    Hemoglobin 12.7 (*)    All other components within normal limits  BASIC METABOLIC PANEL - Abnormal; Notable for the following components:   Glucose, Bld 141 (*)    Creatinine, Ser 1.28 (*)    All other components within normal limits  BRAIN NATRIURETIC PEPTIDE - Abnormal; Notable for the following components:   B Natriuretic Peptide 699.3 (*)    All other components within normal limits  TROPONIN I (HIGH SENSITIVITY) - Abnormal; Notable for the following components:   Troponin I (High Sensitivity) 32 (*)    All other components within normal limits  TROPONIN I (HIGH  SENSITIVITY) - Abnormal; Notable for the following components:   Troponin I (High Sensitivity) 22 (*)    All other components within normal limits  SARS CORONAVIRUS 2 BY RT PCR  TSH    ED ECG REPORT   Date: 07/26/2022  Rate: 118  Rhythm: sinus tachycardia  QRS Axis: normal  Intervals: normal  ST/T Wave abnormalities: nonspecific T wave changes  Conduction Disutrbances:none  Narrative Interpretation:   Old EKG Reviewed: from 10/2010, PVC resolved, faster today  I have personally reviewed the EKG tracing and agree with the computerized  printout as noted.   Radiology DG Chest 2 View  Result Date: 07/26/2022 CLINICAL DATA:  Shortness of breath EXAM: CHEST - 2 VIEW COMPARISON:  07/13/2022 FINDINGS: Small right pleural effusion. Probable mild atelectasis at the lung bases. Stable heart size and mediastinal contours. No edema or consolidation. IMPRESSION: Small right pleural effusion. Electronically Signed   By: Jorje Guild M.D.   On: 07/26/2022 07:16    Procedures Procedures    Medications Ordered in ED Medications  albuterol (PROVENTIL) (2.5 MG/3ML) 0.083% nebulizer solution 5 mg (5 mg Nebulization Given 07/26/22 0753)  ipratropium (ATROVENT) nebulizer solution 0.5 mg (0.5 mg Nebulization Given 07/26/22 0753)  iohexol (OMNIPAQUE) 350 MG/ML injection 100 mL (100 mLs Intravenous Contrast Given 07/26/22 1008)  sodium chloride (PF) 0.9 % injection (10 mLs  Given 07/26/22 1134)  furosemide (LASIX) injection 20 mg (20 mg Intravenous Given 07/26/22 1132)    ED Course/ Medical Decision Making/ A&P    Patient seen and examined. History obtained directly from patient.   Labs/EKG: Ordered CBC, BMP, troponin, BNP.  Imaging: Ordered chest x-ray.  Medications/Fluids: Ordered: Albuterol/Atrovent.   Most recent vital signs reviewed and are as follows: BP (!) 136/107 (BP Location: Right Arm)   Pulse (!) 120   Temp 98 F (36.7 C) (Oral)   Resp 16   Ht '5\' 11"'$  (1.803 m)   Wt 108.9 kg    SpO2 96%   BMI 33.47 kg/m   Initial impression: SOB   Reassessment performed.  Patient receiving albuterol treatment.  Patient appears stable.  Spoke with patient's wife by telephone.  Labs personally reviewed and interpreted including: CBC with elevated white blood cell count of 10.7, hemoglobin low at 12.7 otherwise unremarkable; BMP shows glucose of 141, creatinine of 1.28; BNP 700; troponin minimally elevated at 32, will need second marker.  Imaging personally visualized and interpreted including: Chest x-ray, right pleural effusion  Reviewed pertinent lab work and imaging with patient at bedside. Questions answered.   Plan: Will proceed with CT imaging of the chest to evaluate for pulmonary embolism or other potential causes of pleural effusion and signs of heart failure.  11:08 AM Reassessment performed. Patient appears stable. States that he feels a little better walking.  Labs personally reviewed and interpreted including: Pending second troponin and TSH  Discussed with Dr. Truett Mainland. Plan for admit due to concern for new onset CHF.   Plan: Admit, will give dose of Lasix.  12:17 PM Reassessment performed. Patient appears stable.   Labs personally reviewed and interpreted including: TSH normal, Trop 32 > 22  Most current vital signs reviewed and are as follows: BP (!) 139/110   Pulse (!) 113   Temp 98.2 F (36.8 C) (Oral)   Resp 13   Ht '5\' 11"'$  (1.803 m)   Wt 108.9 kg   SpO2 100%   BMI 33.47 kg/m   Plan: Admit to hospital.   Patient discussed with Dr. Truett Mainland.    12:36 PM consulted with Dr. Olevia Bowens by telephone in regards to admission.  He will see patient for admission.                             Medical Decision Making Amount and/or Complexity of Data Reviewed Labs: ordered. Radiology: ordered.  Risk Prescription drug management. Decision regarding hospitalization.   Patient with shortness of breath, worse with exertion, cough, worse over the past  several weeks.  Concern for new onset CHF.  Elevated  BNP, pleural effusion on x-ray, reflux noted on CT.  Fortunately no PE.  Troponin minimally elevated, but flat.  Low concern for ACS.  EKG nonischemic.  Persistent tachycardia, TSH is normal.        Final Clinical Impression(s) / ED Diagnoses Final diagnoses:  Congestive heart failure, unspecified HF chronicity, unspecified heart failure type [facility])  Shortness of breath    Rx / DC Orders ED Discharge Orders     None         Carlisle Cater, PA-C 07/26/22 1237    Carlisle Cater, PA-C 07/26/22 1238    Cristie Hem, MD 07/26/22 1431

## 2022-07-26 NOTE — ED Notes (Signed)
Came out of another patient's room and found patient standing at the nurse's desk. He was having his IV removed, stated he wanted to leave. Patient was not open to discussion and left prior to me speaking with him.

## 2022-07-28 ENCOUNTER — Inpatient Hospital Stay (HOSPITAL_COMMUNITY)
Admission: EM | Admit: 2022-07-28 | Discharge: 2022-08-05 | DRG: 286 | Disposition: A | Discharge status: Home / Self Care | Payer: No Typology Code available for payment source | Attending: Internal Medicine | Admitting: Internal Medicine

## 2022-07-28 ENCOUNTER — Other Ambulatory Visit: Payer: Self-pay

## 2022-07-28 ENCOUNTER — Encounter (HOSPITAL_COMMUNITY): Payer: Self-pay | Admitting: Emergency Medicine

## 2022-07-28 ENCOUNTER — Emergency Department (HOSPITAL_COMMUNITY): Payer: No Typology Code available for payment source

## 2022-07-28 DIAGNOSIS — J439 Emphysema, unspecified: Secondary | ICD-10-CM | POA: Diagnosis present

## 2022-07-28 DIAGNOSIS — B1710 Acute hepatitis C without hepatic coma: Secondary | ICD-10-CM | POA: Diagnosis present

## 2022-07-28 DIAGNOSIS — I5021 Acute systolic (congestive) heart failure: Secondary | ICD-10-CM | POA: Diagnosis present

## 2022-07-28 DIAGNOSIS — I493 Ventricular premature depolarization: Secondary | ICD-10-CM | POA: Diagnosis present

## 2022-07-28 DIAGNOSIS — I110 Hypertensive heart disease with heart failure: Secondary | ICD-10-CM | POA: Diagnosis not present

## 2022-07-28 DIAGNOSIS — B182 Chronic viral hepatitis C: Secondary | ICD-10-CM | POA: Diagnosis present

## 2022-07-28 DIAGNOSIS — I2510 Atherosclerotic heart disease of native coronary artery without angina pectoris: Secondary | ICD-10-CM | POA: Diagnosis present

## 2022-07-28 DIAGNOSIS — I509 Heart failure, unspecified: Principal | ICD-10-CM

## 2022-07-28 DIAGNOSIS — J449 Chronic obstructive pulmonary disease, unspecified: Secondary | ICD-10-CM | POA: Diagnosis not present

## 2022-07-28 DIAGNOSIS — G4730 Sleep apnea, unspecified: Secondary | ICD-10-CM | POA: Diagnosis present

## 2022-07-28 DIAGNOSIS — M109 Gout, unspecified: Secondary | ICD-10-CM | POA: Diagnosis present

## 2022-07-28 DIAGNOSIS — E669 Obesity, unspecified: Secondary | ICD-10-CM | POA: Diagnosis present

## 2022-07-28 DIAGNOSIS — Z85118 Personal history of other malignant neoplasm of bronchus and lung: Secondary | ICD-10-CM

## 2022-07-28 DIAGNOSIS — Z800 Family history of malignant neoplasm of digestive organs: Secondary | ICD-10-CM

## 2022-07-28 DIAGNOSIS — E785 Hyperlipidemia, unspecified: Secondary | ICD-10-CM | POA: Diagnosis present

## 2022-07-28 DIAGNOSIS — Z79818 Long term (current) use of other agents affecting estrogen receptors and estrogen levels: Secondary | ICD-10-CM

## 2022-07-28 DIAGNOSIS — Z9221 Personal history of antineoplastic chemotherapy: Secondary | ICD-10-CM

## 2022-07-28 DIAGNOSIS — Z85038 Personal history of other malignant neoplasm of large intestine: Secondary | ICD-10-CM | POA: Diagnosis present

## 2022-07-28 DIAGNOSIS — M545 Low back pain, unspecified: Secondary | ICD-10-CM | POA: Diagnosis present

## 2022-07-28 DIAGNOSIS — G4733 Obstructive sleep apnea (adult) (pediatric): Secondary | ICD-10-CM | POA: Diagnosis present

## 2022-07-28 DIAGNOSIS — Z8546 Personal history of malignant neoplasm of prostate: Secondary | ICD-10-CM

## 2022-07-28 DIAGNOSIS — F4312 Post-traumatic stress disorder, chronic: Secondary | ICD-10-CM | POA: Insufficient documentation

## 2022-07-28 DIAGNOSIS — Z9049 Acquired absence of other specified parts of digestive tract: Secondary | ICD-10-CM

## 2022-07-28 DIAGNOSIS — Z902 Acquired absence of lung [part of]: Secondary | ICD-10-CM

## 2022-07-28 DIAGNOSIS — Z87891 Personal history of nicotine dependence: Secondary | ICD-10-CM

## 2022-07-28 DIAGNOSIS — C50922 Malignant neoplasm of unspecified site of left male breast: Secondary | ICD-10-CM | POA: Insufficient documentation

## 2022-07-28 DIAGNOSIS — I10 Essential (primary) hypertension: Secondary | ICD-10-CM | POA: Diagnosis present

## 2022-07-28 DIAGNOSIS — F39 Unspecified mood [affective] disorder: Secondary | ICD-10-CM | POA: Insufficient documentation

## 2022-07-28 DIAGNOSIS — F4310 Post-traumatic stress disorder, unspecified: Secondary | ICD-10-CM | POA: Diagnosis present

## 2022-07-28 DIAGNOSIS — Z79899 Other long term (current) drug therapy: Secondary | ICD-10-CM

## 2022-07-28 DIAGNOSIS — D649 Anemia, unspecified: Secondary | ICD-10-CM | POA: Diagnosis present

## 2022-07-28 DIAGNOSIS — Z6830 Body mass index (BMI) 30.0-30.9, adult: Secondary | ICD-10-CM

## 2022-07-28 DIAGNOSIS — Z853 Personal history of malignant neoplasm of breast: Secondary | ICD-10-CM

## 2022-07-28 DIAGNOSIS — G8929 Other chronic pain: Secondary | ICD-10-CM | POA: Diagnosis present

## 2022-07-28 DIAGNOSIS — I340 Nonrheumatic mitral (valve) insufficiency: Secondary | ICD-10-CM | POA: Diagnosis present

## 2022-07-28 DIAGNOSIS — R7303 Prediabetes: Secondary | ICD-10-CM | POA: Diagnosis present

## 2022-07-28 LAB — BASIC METABOLIC PANEL
Anion gap: 9 (ref 5–15)
BUN: 17 mg/dL (ref 8–23)
CO2: 21 mmol/L — ABNORMAL LOW (ref 22–32)
Calcium: 9 mg/dL (ref 8.9–10.3)
Chloride: 109 mmol/L (ref 98–111)
Creatinine, Ser: 1.27 mg/dL — ABNORMAL HIGH (ref 0.61–1.24)
GFR, Estimated: >60 mL/min (ref >60)
Glucose, Bld: 142 mg/dL — ABNORMAL HIGH (ref 70–99)
Potassium: 4.2 mmol/L (ref 3.5–5.1)
Sodium: 139 mmol/L (ref 135–145)

## 2022-07-28 LAB — CBC
HCT: 39 % (ref 39.0–52.0)
Hemoglobin: 12.3 g/dL — ABNORMAL LOW (ref 13.0–17.0)
MCH: 28.8 pg (ref 26.0–34.0)
MCHC: 31.5 g/dL (ref 30.0–36.0)
MCV: 91.3 fL (ref 80.0–100.0)
Platelets: 225 10*3/uL (ref 150–400)
RBC: 4.27 MIL/uL (ref 4.22–5.81)
RDW: 14.3 % (ref 11.5–15.5)
WBC: 9.9 10*3/uL (ref 4.0–10.5)
nRBC: 0 % (ref 0.0–0.2)

## 2022-07-28 LAB — TROPONIN I (HIGH SENSITIVITY)
Troponin I (High Sensitivity): 31 ng/L — ABNORMAL HIGH (ref <18)
Troponin I (High Sensitivity): 37 ng/L — ABNORMAL HIGH (ref <18)

## 2022-07-28 LAB — MAGNESIUM: Magnesium: 2.1 mg/dL (ref 1.7–2.4)

## 2022-07-28 LAB — BRAIN NATRIURETIC PEPTIDE: B Natriuretic Peptide: 1204.3 pg/mL — ABNORMAL HIGH (ref 0.0–100.0)

## 2022-07-28 LAB — HIV ANTIBODY (ROUTINE TESTING W REFLEX): HIV Screen 4th Generation wRfx: NONREACTIVE

## 2022-07-28 MED ORDER — FUROSEMIDE 10 MG/ML IJ SOLN
40.0000 mg | Freq: Two times a day (BID) | INTRAMUSCULAR | Status: DC
  Administered 2022-07-29: 40 mg via INTRAVENOUS
  Filled 2022-07-28: qty 4

## 2022-07-28 MED ORDER — GABAPENTIN 800 MG PO TABS
400.0000 mg | ORAL_TABLET | Freq: Three times a day (TID) | ORAL | Status: DC
  Filled 2022-07-28: qty 0.5

## 2022-07-28 MED ORDER — ALBUTEROL SULFATE (2.5 MG/3ML) 0.083% IN NEBU
3.0000 mL | INHALATION_SOLUTION | RESPIRATORY_TRACT | Status: DC | PRN
Start: 2022-07-28 — End: 2022-08-05

## 2022-07-28 MED ORDER — UMECLIDINIUM BROMIDE 62.5 MCG/ACT IN AEPB
1.0000 | INHALATION_SPRAY | Freq: Every day | RESPIRATORY_TRACT | Status: DC
  Administered 2022-07-29 – 2022-08-05 (×7): 1 via RESPIRATORY_TRACT
  Filled 2022-07-28 (×3): qty 7

## 2022-07-28 MED ORDER — FUROSEMIDE 10 MG/ML IJ SOLN
40.0000 mg | Freq: Once | INTRAMUSCULAR | Status: AC
  Administered 2022-07-28: 40 mg via INTRAVENOUS
  Filled 2022-07-28: qty 4

## 2022-07-28 MED ORDER — SODIUM CHLORIDE 0.9% FLUSH
3.0000 mL | Freq: Two times a day (BID) | INTRAVENOUS | Status: DC
  Administered 2022-07-28 – 2022-08-01 (×6): 3 mL via INTRAVENOUS

## 2022-07-28 MED ORDER — ENOXAPARIN SODIUM 40 MG/0.4ML IJ SOSY
40.0000 mg | PREFILLED_SYRINGE | INTRAMUSCULAR | Status: DC
Start: 2022-07-28 — End: 2022-07-30
  Administered 2022-07-28 – 2022-07-29 (×2): 40 mg via SUBCUTANEOUS
  Filled 2022-07-28 (×2): qty 0.4

## 2022-07-28 MED ORDER — ACETAMINOPHEN 325 MG PO TABS: 650.0000 mg | ORAL_TABLET | Freq: Four times a day (QID) | ORAL | Status: DC | PRN

## 2022-07-28 MED ORDER — GABAPENTIN 400 MG PO CAPS
400.0000 mg | ORAL_CAPSULE | Freq: Three times a day (TID) | ORAL | Status: DC
Start: 2022-07-28 — End: 2022-08-05
  Administered 2022-07-28 – 2022-08-05 (×22): 400 mg via ORAL
  Filled 2022-07-28 (×22): qty 1

## 2022-07-28 MED ORDER — TIOTROPIUM BROMIDE MONOHYDRATE 18 MCG IN CAPS: 18.0000 ug | ORAL_CAPSULE | Freq: Every day | RESPIRATORY_TRACT | Status: DC

## 2022-07-28 MED ORDER — LISINOPRIL 20 MG PO TABS
40.0000 mg | ORAL_TABLET | Freq: Every day | ORAL | Status: DC
  Administered 2022-07-29: 40 mg via ORAL
  Filled 2022-07-28: qty 2

## 2022-07-28 MED ORDER — ACETAMINOPHEN 650 MG RE SUPP: 650.0000 mg | Freq: Four times a day (QID) | RECTAL | Status: DC | PRN

## 2022-07-28 MED ORDER — PRAVASTATIN SODIUM 10 MG PO TABS
10.0000 mg | ORAL_TABLET | Freq: Every day | ORAL | Status: DC
  Administered 2022-07-29 – 2022-07-30 (×2): 10 mg via ORAL
  Filled 2022-07-28 (×3): qty 1

## 2022-07-28 MED ORDER — POLYETHYLENE GLYCOL 3350 17 G PO PACK: 17.0000 g | PACK | Freq: Every day | ORAL | Status: DC | PRN

## 2022-07-28 MED ORDER — IPRATROPIUM-ALBUTEROL 0.5-2.5 (3) MG/3ML IN SOLN
3.0000 mL | Freq: Once | RESPIRATORY_TRACT | Status: AC
Start: 2022-07-28 — End: 2022-07-28
  Administered 2022-07-28: 3 mL via RESPIRATORY_TRACT
  Filled 2022-07-28: qty 3

## 2022-07-28 NOTE — ED Notes (Signed)
ED Provider at bedside. 

## 2022-07-28 NOTE — ED Triage Notes (Signed)
Patient presents from [facility] w/ complaints of SHOB for 3 weeks now. Pt reports hx of COPD and emphysema. Denies chest pain, abdominal pain at this time.

## 2022-07-28 NOTE — H&P (Signed)
History and Physical   Jose Ferguson UKG:[id number] DOB: [birth year] DOA: 07/28/2022  PCP: [facility]   Patient coming from: Home  Chief Complaint: Shortness of Breath  HPI: Jose Ferguson is a 66 y.o. male with medical history significant of hepatitis C, hypertension, breast cancer, colon cancer, prostate cancer, COPD, hyperlipidemia, OSA, obesity, anemia, gout presenting with shortness of breath.  Patient reports 3 weeks of progressively worsening shortness of breath.  Has had associated orthopnea and dyspnea on exertion as well as some palpitations.  Denies any chest pain does report a cough.  He was seen in the ED 2 days ago and was diagnosed with new onset CHF.  He left AMA prior to this diagnosis being communicated to him and did not know that there was a recommendation for admission at that time.  He represented due to continued symptoms.  He denies fevers, chills, abdominal pain, nausea, vomiting, constipation, diarrhea.  ED Course: Vital signs in the ED significant for heart rate in the 110s to 120s, blood pressure in the 762G to 315V systolic.  Lab work-up included BMP with bicarb 21, creatinine of 1.27 which is stable from 2 days ago however increased from 4 years ago, do not have other labs our system at this time.  Glucose 142.  CBC with hemoglobin stable at 12.3.  BMP still pending but 2 days ago was elevated to 700.  Troponin 31, will get repeat**.  Chest x-ray showed stable right pleural effusion and presumed basilar atelectasis.  Patient received DuoNebs and a dose of Lasix in the ED.  Cardiology was consulted and will see the patient.  Review of Systems: As per HPI otherwise all other systems reviewed and are negative.  Past Medical History:  Diagnosis Date   Cancer ([place])    colon cancer 2001, prostate cancer 2011   COPD (chronic obstructive pulmonary disease) ([place])    Emphysema of lung ([place])    Hypertension    PTSD (post-traumatic stress disorder)    Seasonal  allergies     Past Surgical History:  Procedure Laterality Date   BREAST SURGERY     COLON RESECTION  12/18/1999   laceration hand  12/17/1978   right hand   laceration leg  12/17/1980   left leg   LUNG LOBECTOMY     PROSTATECTOMY  09/16/2010    Social History  reports that he quit smoking about 12 years ago. His smoking use included cigarettes. He quit smokeless tobacco use about 12 years ago. He reports current alcohol use of about 14.0 standard drinks of alcohol per week. He reports that he does not use drugs.  No Known Allergies  Family History  Problem Relation Age of Onset   Colon cancer Father 16   Colon cancer Sister 83   Stomach cancer Neg Hx   Reviewed on admission  Prior to Admission medications   Medication Sig Start Date End Date Taking? Authorizing Provider  albuterol (PROVENTIL HFA) 108 (90 Base) MCG/ACT inhaler Inhale 2 puffs into the lungs. Every 4 to 6 hours as needed for wheezing and shortness of breath    [provider]  benzonatate (TESSALON) 100 MG capsule Take 1 capsule (100 mg total) by mouth every 8 (eight) hours as needed for cough. 07/13/22   Teodora Medici, FNP  docusate sodium (COLACE) 100 MG capsule Take 100 mg by mouth daily as needed for constipation.    [provider]  doxycycline (VIBRAMYCIN) 100 MG capsule Take 1 capsule (100 mg total)  by mouth 2 (two) times daily. 07/13/22   Teodora Medici, FNP  fluticasone (FLONASE) 50 MCG/ACT nasal spray 1 spray by Nasal route 2 (two) times daily.      [provider]  gabapentin (NEURONTIN) 600 MG tablet Take 600 mg by mouth 3 (three) times daily. 01/28/18   [provider]  HYDROcodone-acetaminophen (NORCO/VICODIN) 5-325 MG tablet Take 1 tablet by mouth every 6 (six) hours as needed for moderate pain. 9/47/65   Delora Fuel, MD  leuprolide, 6 Month, (LEUPROLIDE ACETATE, 6 MONTH,) 45 MG injection Inject 45 mg into the skin. Every 6 months    [provider]   lisinopril (PRINIVIL,ZESTRIL) 40 MG tablet Take 40 mg by mouth daily.    [provider]  loratadine (CLARITIN) 10 MG tablet Take 10 mg by mouth daily.    [provider]  losartan (COZAAR) 50 MG tablet Take 50 mg by mouth daily.    [provider]  Magnesium Oxide (MAOX) 420 MG TABS Take 400 mg by mouth daily.    [provider]  methocarbamol (ROBAXIN) 500 MG tablet Take 500 mg by mouth 2 (two) times daily.    [provider]  Naproxen (NAPROSYN PO) Take 1 tablet by mouth daily as needed (pain).     [provider]  sertraline (ZOLOFT) 100 MG tablet Take 100 mg by mouth daily.    [provider]  Tetrahydrozoline HCl (VISINE OP) Place 1 drop into both eyes daily as needed (red eye).    [provider]  tiotropium (SPIRIVA) 18 MCG inhalation capsule Place 18 mcg into inhaler and inhale daily.    [provider]    Physical Exam: Vitals:   07/28/22 2015 07/28/22 2030 07/28/22 2045 07/28/22 2100  BP: (!) 126/100 (!) 123/106 (!) 115/105 (!) 131/110  Pulse: (!) 115 (!) 120 (!) 129 (!) 115  Resp: 17 17 (!) 26 (!) 24  Temp:      TempSrc:      SpO2: 99% 100% 100% 100%    Physical Exam Constitutional:      General: He is not in acute distress.    Appearance: Normal appearance.  HENT:     Head: Normocephalic and atraumatic.     Mouth/Throat:     Mouth: Mucous membranes are moist.     Pharynx: Oropharynx is clear.  Eyes:     Extraocular Movements: Extraocular movements intact.     Pupils: Pupils are equal, round, and reactive to light.  Cardiovascular:     Rate and Rhythm: Regular rhythm. Tachycardia present.     Pulses: Normal pulses.     Heart sounds: Normal heart sounds.  Pulmonary:     Effort: Pulmonary effort is normal. No respiratory distress.     Breath sounds: Wheezing and rales (Trace) present.  Abdominal:     General: Bowel sounds are normal. There is no distension.     Palpations: Abdomen  is soft.     Tenderness: There is no abdominal tenderness.  Musculoskeletal:        General: No swelling or deformity.     Right lower leg: No edema.     Left lower leg: No edema.  Skin:    General: Skin is warm and dry.  Neurological:     General: No focal deficit present.     Mental Status: Mental status is at baseline.    Labs on Admission: I have personally reviewed following labs and imaging studies  CBC: Recent Labs  Lab 07/26/22 0716 07/28/22 1706  WBC 10.7* 9.9  NEUTROABS 7.0  --   HGB 12.7* 12.3*  HCT 40.3 39.0  MCV 91.8 91.3  PLT 212 177    Basic Metabolic Panel: Recent Labs  Lab 07/26/22 0716 07/28/22 1706  NA 140 139  K 4.5 4.2  CL 107 109  CO2 26 21*  GLUCOSE 141* 142*  BUN 19 17  CREATININE 1.28* 1.27*  CALCIUM 9.0 9.0    GFR: Estimated Creatinine Clearance: 69.8 mL/min (A) (by C-G formula based on SCr of 1.27 mg/dL (H)).  Liver Function Tests: No results for input(s): "AST", "ALT", "ALKPHOS", "BILITOT", "PROT", "ALBUMIN" in the last 168 hours.  Urine analysis:    Component Value Date/Time   COLORURINE YELLOW 08/30/2010 0845   APPEARANCEUR CLEAR 08/30/2010 0845   LABSPEC >=1.030 10/25/2010 1032   PHURINE 5.0 10/25/2010 1032   GLUCOSEU NEGATIVE 08/30/2010 0845   HGBUR trace-intact 10/25/2010 1032   BILIRUBINUR negative 10/25/2010 1032   KETONESUR NEGATIVE 08/30/2010 0845   PROTEINUR NEGATIVE 08/30/2010 0845   UROBILINOGEN 0.2 10/25/2010 1032   NITRITE negative 10/25/2010 1032   LEUKOCYTESUR NEGATIVE 08/30/2010 0845    Radiological Exams on Admission: DG Chest 2 View  Result Date: 07/28/2022 CLINICAL DATA:  Chest pain and shortness of breath EXAM: CHEST - 2 VIEW COMPARISON:  Radiographs 07/26/2022 FINDINGS: No change from 07/26/2022. Small right pleural effusion. Probable bibasilar atelectasis. Stable cardiomediastinal silhouette. No acute osseous abnormality. IMPRESSION: Unchanged small right pleural effusion and presumed basilar  atelectasis. Electronically Signed   By: Placido Sou M.D.   On: 07/28/2022 17:39    EKG: Independently reviewed.  Sinus tachycardia versus junctional rhythm at 120 bpm.  Nonspecific ST/T wave changes.  QTc of 584.  PVC noted.  Assessment/Plan Principal Problem:   Acute CHF ([place]) Active Problems:   HEPATITIS C   Essential hypertension   History of malignant neoplasm of large intestine   PROSTATE CANCER, HX OF   Chronic obstructive pulmonary disease (COPD) (H[facility])   Hyperlipidemia   Acute CHF > Patient presenting with progressive shortness of breath with past several weeks.  Also with associated orthopnea and dyspnea on exertion. > Not hypoxic in the ED.  Was here 2 days ago with elevated BNP repeat BMP is pending.  He did leave AMA at that time however this was prior to his diagnosis and recommendation for admission being communicated to him. > Cardiology was consulted in the ED and will see the patient. - Keep monitor on telemetry - Continue with Lasix 40 mg IV twice daily - Strict I's and O's, daily weights - Echocardiogram - Check magnesium - Trend renal function electrolytes - Continue home lisinopril  Hypertension - Continue home lisinopril  COPD - Continue Spiriva (home maintenance inhaler is nonformulary) and as needed albuterol  OSA - Declines CPAP, request Supplemental O2 instead  Hyperlipidemia - Continue home pravastatin  Anemia > Stable at 12.3 - Continue to trend CBC  Chronic pain - Continue as needed hydrocodone-acetaminophen  Obesity Chronic Hep C - Noted  History of breast cancer, colon cancer, prostate cancer - Noted - Per chart review remains on Xtandi for prostate cancer  DVT prophylaxis: Lovenox Code Status:   Full Family Communication:  None on admission, states family will be here to see him again soon and are up-to-date. Disposition Plan:   Patient is from:  Home  Anticipated DC to:  Home  Anticipated DC date:  1 to 4  days  Anticipated DC barriers: None  Consults called:  Cardiology, consulted in the ED, will see the patient. Admission status:  Patient, telemetry  Severity of Illness: The appropriate patient status for this patient is OBSERVATION. Observation status is judged to be reasonable and necessary in order to provide the required intensity of service to ensure the patient's safety. The patient's presenting symptoms, physical exam findings, and initial radiographic and laboratory data in the context of their medical condition is felt to place them at decreased risk for further clinical deterioration. Furthermore, it is anticipated that the patient will be medically stable for discharge from the hospital within 2 midnights of admission.    Marcelyn Bruins MD [facility]  How to contact the [facility] Attending or Consulting provider [place] or covering provider during after hours [place], for this patient?   Check the care team in [facility] and look for a) attending/consulting [facility] provider listed and b) the [facility] team listed Log into [email or web address] and use [place]'s universal password to access. If you do not have the password, please contact the hospital operator. Locate the [facility] provider you are looking for under [facility] and page to a number that you can be directly reached. If you still have difficulty reaching the provider, please page the [facility] (Director on Call) for the Hospitalists listed on amion for assistance.  07/28/2022, 9:16 PM

## 2022-07-28 NOTE — ED Provider Triage Note (Signed)
Emergency Medicine Provider Triage Evaluation Note  Jose Ferguson , a 66 y.o. male  was evaluated in triage.  Pt complains of worsening shortness of breath.  Hx of COPD and emphysema.  Seen in the ED 2 days ago, was recommended for admission, however patient left and did not want a wait any longer, states he was not seen for a long period of time.  Had some improvement after he left, but significant worsening over the last day or so.  Denies chest pain.  Endorses increased cough, denies fevers, chills, or N/V  Review of Systems  Positive:  Negative: See above  Physical Exam  BP (!) 140/102 (BP Location: Right Arm)   Pulse (!) 121   Temp 99 F (37.2 C) (Oral)   Resp 20   SpO2 99%  Gen:   Awake, ill-appearing, in NAD. AAOx4 Resp:  Increased effort, mildly reduced air movement, global wheezing appreciated MSK:   Moves extremities without difficulty  Other:  Abdomen soft nontender.  Chest nontender.  Medical Decision Making  Medically screening exam initiated at 5:14 PM.  Appropriate orders placed.  [name] was informed that the remainder of the evaluation will be completed by another provider, this initial triage assessment does not replace that evaluation, and the importance of remaining in the ED until their evaluation is complete.  Discussed patient with triage nurse, plan to move pt to the next available room.   [name], PA-C 63/84/66 1719

## 2022-07-28 NOTE — ED Provider Notes (Signed)
[facility] EMERGENCY DEPARTMENT Provider Note   CSN: [id number] Arrival date & time: 07/28/22  1653     History  Chief Complaint  Patient presents with   Shortness of Breath    Jose Ferguson is a 66 y.o. male.   Shortness of Breath   66 year old male presents emergency department with complaints of shortness of breath.  Patient states that he has had increasing shortness of breath over the past 3 weeks.  He notes some difficulty breathing when lying down at night as well as with minimal physical exertion.  He denies any overt chest pain.  He feels like he has had palpitations intermittently though.  He was recently seen in the emergency department 2 days ago and diagnosed with new onset CHF.  Information about his laboratory studies as well as his pending admission status for not communicating to the patient so he left AGAINST MEDICAL ADVICE because he did not know what was going on.  He denies history of CHF.  Denies history of cocaine use or current smoking.  He is secondarily endorses a mildly productive cough that has been increasingly worse over the past 3 weeks.  Denies fever, chills, night sweats, abdominal pain, nausea, vomiting, urinary symptoms, change in bowel habits.  Past medical history significant for COPD, emphysema, hypertension, PTSD, cancer  Home Medications Prior to Admission medications   Medication Sig Start Date End Date Taking? Authorizing Provider  albuterol (PROVENTIL HFA) 108 (90 Base) MCG/ACT inhaler Inhale 2 puffs into the lungs. Every 4 to 6 hours as needed for wheezing and shortness of breath   Yes [provider]  docusate sodium (COLACE) 100 MG capsule Take 100 mg by mouth daily as needed for constipation.   Yes [provider]  fluticasone (FLONASE) 50 MCG/ACT nasal spray 1 spray by Nasal route 2 (two) times daily.     Yes [provider]  gabapentin (NEURONTIN) 600 MG tablet Take 600 mg by mouth 3 (three)  times daily. 01/28/18  Yes [provider]  HYDROcodone-acetaminophen (NORCO/VICODIN) 5-325 MG tablet Take 1 tablet by mouth every 6 (six) hours as needed for moderate pain. 04/17/76  Yes Delora Fuel, MD  lisinopril (PRINIVIL,ZESTRIL) 40 MG tablet Take 40 mg by mouth daily.   Yes [provider]  loratadine (CLARITIN) 10 MG tablet Take 10 mg by mouth daily.   Yes [provider]  Magnesium Oxide (MAOX) 420 MG TABS Take 400 mg by mouth daily.   Yes [provider]  methocarbamol (ROBAXIN) 500 MG tablet Take 500 mg by mouth 2 (two) times daily.   Yes [provider]  Naproxen (NAPROSYN PO) Take 1 tablet by mouth daily as needed (pain).    Yes [provider]  sertraline (ZOLOFT) 100 MG tablet Take 100 mg by mouth daily.   Yes [provider]  Tetrahydrozoline HCl (VISINE OP) Place 1 drop into both eyes daily as needed (red eye).   Yes [provider]  tiotropium (SPIRIVA) 18 MCG inhalation capsule Place 18 mcg into inhaler and inhale daily.   Yes [provider]  benzonatate (TESSALON) 100 MG capsule Take 1 capsule (100 mg total) by mouth every 8 (eight) hours as needed for cough. Patient not taking: Reported on 07/28/2022 07/13/22   Teodora Medici, FNP  doxycycline (VIBRAMYCIN) 100 MG capsule Take 1 capsule (100 mg total) by mouth 2 (two) times daily. Patient not taking: Reported on 07/28/2022 07/13/22   Teodora Medici, FNP  leuprolide,  6 Month, (LEUPROLIDE ACETATE, 6 MONTH,) 45 MG injection Inject 45 mg into the skin. Every 6 months    [provider]  losartan (COZAAR) 50 MG tablet Take 50 mg by mouth daily.    [provider]      Allergies    Patient has no known allergies.    Review of Systems   Review of Systems  Respiratory:  Positive for shortness of breath.     Physical Exam Updated Vital Signs BP (!) 129/90 (BP Location: Right Arm)   Pulse (!) 125   Temp 98.3 F (36.8 C) (Oral)    Resp 20   Ht '5\' 11"'$  (1.803 m)   Wt 104.2 kg   SpO2 100%   BMI 32.05 kg/m  Physical Exam Vitals and nursing note reviewed.  Constitutional:      General: He is not in acute distress.    Appearance: He is well-developed.  HENT:     Head: Normocephalic and atraumatic.  Eyes:     Conjunctiva/sclera: Conjunctivae normal.  Cardiovascular:     Rate and Rhythm: Regular rhythm. Tachycardia present.     Heart sounds: No murmur heard. Pulmonary:     Effort: No respiratory distress.     Breath sounds: Wheezing present.     Comments: Diffuse wheeze on initial auscultation of lungs in all fields.  Patient obviously tachypneic during exam. Abdominal:     Palpations: Abdomen is soft.     Tenderness: There is no abdominal tenderness.  Musculoskeletal:        General: No swelling.     Cervical back: Neck supple.     Right lower leg: Edema present.     Left lower leg: Edema present.     Comments: 1+ pitting edema noted bilateral lower extremities.  Skin:    General: Skin is warm and dry.     Capillary Refill: Capillary refill takes less than 2 seconds.  Neurological:     Mental Status: He is alert.  Psychiatric:        Mood and Affect: Mood normal.     ED Results / Procedures / Treatments   Labs (all labs ordered are listed, but only abnormal results are displayed) Labs Reviewed  BASIC METABOLIC PANEL - Abnormal; Notable for the following components:      Result Value   CO2 21 (*)    Glucose, Bld 142 (*)    Creatinine, Ser 1.27 (*)    All other components within normal limits  CBC - Abnormal; Notable for the following components:   Hemoglobin 12.3 (*)    All other components within normal limits  BRAIN NATRIURETIC PEPTIDE - Abnormal; Notable for the following components:   B Natriuretic Peptide 1,204.3 (*)    All other components within normal limits  TROPONIN I (HIGH SENSITIVITY) - Abnormal; Notable for the following components:   Troponin I (High Sensitivity) 31 (*)    All  other components within normal limits  TROPONIN I (HIGH SENSITIVITY) - Abnormal; Notable for the following components:   Troponin I (High Sensitivity) 37 (*)    All other components within normal limits  HIV ANTIBODY (ROUTINE TESTING W REFLEX)  MAGNESIUM  COMPREHENSIVE METABOLIC PANEL  CBC    EKG None  Radiology DG Chest 2 View  Result Date: 07/28/2022 CLINICAL DATA:  Chest pain and shortness of breath EXAM: CHEST - 2 VIEW COMPARISON:  Radiographs 07/26/2022 FINDINGS: No change from 07/26/2022. Small right pleural effusion. Probable bibasilar atelectasis. Stable cardiomediastinal silhouette.  No acute osseous abnormality. IMPRESSION: Unchanged small right pleural effusion and presumed basilar atelectasis. Electronically Signed   By: Placido Sou M.D.   On: 07/28/2022 17:39    Procedures Procedures    Medications Ordered in ED Medications  lisinopril (ZESTRIL) tablet 40 mg (has no administration in time range)  albuterol (PROVENTIL) (2.5 MG/3ML) 0.083% nebulizer solution 3 mL (has no administration in time range)  enoxaparin (LOVENOX) injection 40 mg (40 mg Subcutaneous Given 07/28/22 2330)  furosemide (LASIX) injection 40 mg (has no administration in time range)  sodium chloride flush (NS) 0.9 % injection 3 mL (3 mLs Intravenous Given 07/28/22 2318)  acetaminophen (TYLENOL) tablet 650 mg (has no administration in time range)    Or  acetaminophen (TYLENOL) suppository 650 mg (has no administration in time range)  polyethylene glycol (MIRALAX / GLYCOLAX) packet 17 g (has no administration in time range)  pravastatin (PRAVACHOL) tablet 10 mg (has no administration in time range)  umeclidinium bromide (INCRUSE ELLIPTA) 62.5 MCG/ACT 1 puff (has no administration in time range)  gabapentin (NEURONTIN) capsule 400 mg (400 mg Oral Given 07/28/22 2330)  furosemide (LASIX) injection 40 mg (40 mg Intravenous Given 07/28/22 2140)  ipratropium-albuterol (DUONEB) 0.5-2.5 (3) MG/3ML nebulizer  solution 3 mL (3 mLs Nebulization Given 07/28/22 2059)    ED Course/ Medical Decision Making/ A&P Clinical Course as of 07/29/22 0116  Sat Jul 28, 2022  2050 Consulted Dr. Renella Cunas of cardiology.  He agreed with admission of the patient and will see the patient in the hospital after admission through hospital medicine. [CR]  2237 Consulted Dr. Sheppard Coil of hospital medicine.  He agreed with admission of the patient and assume further treatment/care of patient. [CR]    Clinical Course User Index [CR] Wilnette Kales, PA                           Medical Decision Making Amount and/or Complexity of Data Reviewed Labs: ordered. Radiology: ordered.  Risk Prescription drug management. Decision regarding hospitalization.   This patient presents to the ED for concern of shortness of breath, this involves an extensive number of treatment options, and is a complaint that carries with it a high risk of complications and morbidity.  The differential diagnosis includes The causes for shortness of breath include but are not limited to Cardiac (AHF, pericardial effusion and tamponade, arrhythmias, ischemia, etc) Respiratory (COPD, asthma, pneumonia, pneumothorax, primary pulmonary hypertension, PE/VQ mismatch) Hematological (anemia)   Co morbidities that complicate the patient evaluation  See HPI   Additional history obtained:  Additional history obtained from EMR External records from outside source obtained and reviewed including BN P from prior hospital visit of 699   Lab Tests:  I Ordered, and personally interpreted labs.  The pertinent results include: No leukocytosis.  Mild anemia with a hemoglobin of 12.3.  No electrolyte abnormalities.  Bicarb decreased with 21.  Creatinine 1.27, BUN of 17 with GFR greater than 60.  BNP 1204 showing elevation from prior study.  Initial troponin of 31 with no EKG findings indicative of ischemia.  Second troponin pending upon admission.   Imaging  Studies ordered:  I ordered imaging studies including chest x-ray I independently visualized and interpreted imaging which showed unchanged small right pleural effusion and presumed basilar atelectasis I agree with the radiologist interpretation  Cardiac Monitoring: / EKG:  The patient was maintained on a cardiac monitor.  I personally viewed and interpreted the cardiac monitored which showed  an underlying rhythm of: Sinus tachycardia with nonspecific ST/T wave abnormalities.  Prolonged QTc.   Consultations Obtained:  See ED course  Problem List / ED Course / Critical interventions / Medication management  Acute CHF exacerbation I ordered medication including Lasix for diuresis, DuoNeb for wheeze.    Reevaluation of the patient after these medicines showed that the patient improved I have reviewed the patients home medicines and have made adjustments as needed   Social Determinants of Health:  Denies tobacco use currently, illicit drug use.   Test / Admission - Considered:  Acute CHF exacerbation Vitals signs significant for tachycardia with a rate persistently above 120.  Patient has been persistently tachycardic for the past at least 3 to 4 days.  Patient 98 to 100% on room air.. Otherwise within normal range and stable throughout visit. Laboratory/imaging studies significant for: See above Given patient's new acute CHF exacerbation and current willingness to be hospitalized, hospital admission deemed necessary.  Cardiologist Dr. Renella Cunas as well as hospital medicine Dr. Trilby Drummer was consulted regarding the patient and they agreed with admission.  Treatment plan was discussed at length with patient and family and they acknowledge understanding and were agreeable to said plan.  Patient was stable upon admission.        Final Clinical Impression(s) / ED Diagnoses Final diagnoses:  Acute on chronic congestive heart failure, unspecified heart failure type [facility])    Rx / DC  Orders ED Discharge Orders     None         Wilnette Kales, [place] 07/29/22 7616    Fredia Sorrow, MD 08/09/22 1800
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
## 2022-07-29 ENCOUNTER — Inpatient Hospital Stay: Payer: Self-pay

## 2022-07-29 ENCOUNTER — Observation Stay (HOSPITAL_COMMUNITY): Payer: No Typology Code available for payment source

## 2022-07-29 DIAGNOSIS — R0609 Other forms of dyspnea: Secondary | ICD-10-CM | POA: Diagnosis not present

## 2022-07-29 DIAGNOSIS — Z9049 Acquired absence of other specified parts of digestive tract: Secondary | ICD-10-CM | POA: Diagnosis not present

## 2022-07-29 DIAGNOSIS — Z853 Personal history of malignant neoplasm of breast: Secondary | ICD-10-CM | POA: Diagnosis not present

## 2022-07-29 DIAGNOSIS — Z800 Family history of malignant neoplasm of digestive organs: Secondary | ICD-10-CM | POA: Diagnosis not present

## 2022-07-29 DIAGNOSIS — F4310 Post-traumatic stress disorder, unspecified: Secondary | ICD-10-CM | POA: Diagnosis present

## 2022-07-29 DIAGNOSIS — I5021 Acute systolic (congestive) heart failure: Secondary | ICD-10-CM | POA: Diagnosis present

## 2022-07-29 DIAGNOSIS — J439 Emphysema, unspecified: Secondary | ICD-10-CM | POA: Diagnosis present

## 2022-07-29 DIAGNOSIS — Z79899 Other long term (current) drug therapy: Secondary | ICD-10-CM | POA: Diagnosis not present

## 2022-07-29 DIAGNOSIS — I340 Nonrheumatic mitral (valve) insufficiency: Secondary | ICD-10-CM | POA: Diagnosis present

## 2022-07-29 DIAGNOSIS — Z85038 Personal history of other malignant neoplasm of large intestine: Secondary | ICD-10-CM | POA: Diagnosis not present

## 2022-07-29 DIAGNOSIS — I5023 Acute on chronic systolic (congestive) heart failure: Secondary | ICD-10-CM | POA: Diagnosis not present

## 2022-07-29 DIAGNOSIS — I2510 Atherosclerotic heart disease of native coronary artery without angina pectoris: Secondary | ICD-10-CM | POA: Diagnosis present

## 2022-07-29 DIAGNOSIS — G4733 Obstructive sleep apnea (adult) (pediatric): Secondary | ICD-10-CM | POA: Diagnosis present

## 2022-07-29 DIAGNOSIS — Z6830 Body mass index (BMI) 30.0-30.9, adult: Secondary | ICD-10-CM | POA: Diagnosis not present

## 2022-07-29 DIAGNOSIS — E669 Obesity, unspecified: Secondary | ICD-10-CM | POA: Diagnosis present

## 2022-07-29 DIAGNOSIS — I493 Ventricular premature depolarization: Secondary | ICD-10-CM | POA: Diagnosis present

## 2022-07-29 DIAGNOSIS — R7303 Prediabetes: Secondary | ICD-10-CM | POA: Diagnosis present

## 2022-07-29 DIAGNOSIS — B1710 Acute hepatitis C without hepatic coma: Secondary | ICD-10-CM | POA: Diagnosis not present

## 2022-07-29 DIAGNOSIS — B182 Chronic viral hepatitis C: Secondary | ICD-10-CM | POA: Diagnosis present

## 2022-07-29 DIAGNOSIS — Z9221 Personal history of antineoplastic chemotherapy: Secondary | ICD-10-CM | POA: Diagnosis not present

## 2022-07-29 DIAGNOSIS — M545 Low back pain, unspecified: Secondary | ICD-10-CM | POA: Diagnosis present

## 2022-07-29 DIAGNOSIS — I110 Hypertensive heart disease with heart failure: Secondary | ICD-10-CM | POA: Diagnosis present

## 2022-07-29 DIAGNOSIS — D511 Vitamin B12 deficiency anemia due to selective vitamin B12 malabsorption with proteinuria: Secondary | ICD-10-CM | POA: Diagnosis not present

## 2022-07-29 DIAGNOSIS — I509 Heart failure, unspecified: Secondary | ICD-10-CM | POA: Diagnosis not present

## 2022-07-29 DIAGNOSIS — E785 Hyperlipidemia, unspecified: Secondary | ICD-10-CM | POA: Diagnosis present

## 2022-07-29 DIAGNOSIS — Z87891 Personal history of nicotine dependence: Secondary | ICD-10-CM | POA: Diagnosis not present

## 2022-07-29 DIAGNOSIS — M109 Gout, unspecified: Secondary | ICD-10-CM | POA: Diagnosis present

## 2022-07-29 DIAGNOSIS — Z8546 Personal history of malignant neoplasm of prostate: Secondary | ICD-10-CM | POA: Diagnosis not present

## 2022-07-29 DIAGNOSIS — J449 Chronic obstructive pulmonary disease, unspecified: Secondary | ICD-10-CM | POA: Diagnosis not present

## 2022-07-29 DIAGNOSIS — D649 Anemia, unspecified: Secondary | ICD-10-CM | POA: Diagnosis present

## 2022-07-29 LAB — COMPREHENSIVE METABOLIC PANEL
ALT: 25 U/L (ref 0–44)
AST: 29 U/L (ref 15–41)
Albumin: 3.3 g/dL — ABNORMAL LOW (ref 3.5–5.0)
Alkaline Phosphatase: 47 U/L (ref 38–126)
Anion gap: 11 (ref 5–15)
BUN: 16 mg/dL (ref 8–23)
CO2: 21 mmol/L — ABNORMAL LOW (ref 22–32)
Calcium: 8.7 mg/dL — ABNORMAL LOW (ref 8.9–10.3)
Chloride: 107 mmol/L (ref 98–111)
Creatinine, Ser: 1.36 mg/dL — ABNORMAL HIGH (ref 0.61–1.24)
GFR, Estimated: 57 mL/min — ABNORMAL LOW (ref >60)
Glucose, Bld: 141 mg/dL — ABNORMAL HIGH (ref 70–99)
Potassium: 4.3 mmol/L (ref 3.5–5.1)
Sodium: 139 mmol/L (ref 135–145)
Total Bilirubin: 0.6 mg/dL (ref 0.3–1.2)
Total Protein: 6.1 g/dL — ABNORMAL LOW (ref 6.5–8.1)

## 2022-07-29 LAB — CBC
HCT: 38.6 % — ABNORMAL LOW (ref 39.0–52.0)
Hemoglobin: 12.2 g/dL — ABNORMAL LOW (ref 13.0–17.0)
MCH: 28.7 pg (ref 26.0–34.0)
MCHC: 31.6 g/dL (ref 30.0–36.0)
MCV: 90.8 fL (ref 80.0–100.0)
Platelets: 211 10*3/uL (ref 150–400)
RBC: 4.25 MIL/uL (ref 4.22–5.81)
RDW: 14.3 % (ref 11.5–15.5)
WBC: 9.7 10*3/uL (ref 4.0–10.5)
nRBC: 0 % (ref 0.0–0.2)

## 2022-07-29 LAB — ECHOCARDIOGRAM COMPLETE
Calc EF: 34.5 %
Height: 71 in
MV M vel: 3.85 m/s
MV Peak grad: 59.3 mmHg
Radius: 0.3 cm
S' Lateral: 5.7 cm
Single Plane A2C EF: 34.2 %
Single Plane A4C EF: 34.7 %
Weight: 3676.81 oz

## 2022-07-29 LAB — COOXEMETRY PANEL
Carboxyhemoglobin: 0.8 % (ref 0.5–1.5)
Carboxyhemoglobin: 0.8 % (ref 0.5–1.5)
Methemoglobin: <0.7 % (ref 0.0–1.5)
Methemoglobin: <0.7 % (ref 0.0–1.5)
O2 Saturation: 21 %
O2 Saturation: 70.6 %
Total hemoglobin: 12.8 g/dL (ref 12.0–16.0)
Total hemoglobin: 12.6 g/dL (ref 12.0–16.0)

## 2022-07-29 LAB — LACTIC ACID, PLASMA
Lactic Acid, Venous: 1.6 mmol/L (ref 0.5–1.9)
Lactic Acid, Venous: 3.2 mmol/L (ref 0.5–1.9)
Lactic Acid, Venous: 3.1 mmol/L (ref 0.5–1.9)
Lactic Acid, Venous: 2.8 mmol/L (ref 0.5–1.9)

## 2022-07-29 MED ORDER — SODIUM CHLORIDE 0.9% FLUSH
10.0000 mL | INTRAVENOUS | Status: DC | PRN
  Administered 2022-08-04 – 2022-08-05 (×2): 10 mL

## 2022-07-29 MED ORDER — SPIRONOLACTONE 12.5 MG HALF TABLET: 12.5000 mg | ORAL_TABLET | Freq: Every day | ORAL | Status: DC

## 2022-07-29 MED ORDER — SODIUM CHLORIDE 0.9% FLUSH
10.0000 mL | Freq: Two times a day (BID) | INTRAVENOUS | Status: DC
  Administered 2022-07-29 – 2022-08-05 (×10): 10 mL

## 2022-07-29 MED ORDER — PERFLUTREN LIPID MICROSPHERE
1.0000 mL | INTRAVENOUS | Status: AC | PRN
  Administered 2022-07-29: 2 mL via INTRAVENOUS

## 2022-07-29 MED ORDER — SODIUM CHLORIDE 0.9% FLUSH
3.0000 mL | Freq: Two times a day (BID) | INTRAVENOUS | Status: DC
Start: 2022-07-29 — End: 2022-08-05
  Administered 2022-07-29 – 2022-08-01 (×4): 3 mL via INTRAVENOUS

## 2022-07-29 MED ORDER — FUROSEMIDE 10 MG/ML IJ SOLN: 80.0000 mg | Freq: Two times a day (BID) | INTRAMUSCULAR | Status: DC

## 2022-07-29 MED ORDER — CHLORHEXIDINE GLUCONATE CLOTH 2 % EX PADS
6.0000 | MEDICATED_PAD | Freq: Every day | CUTANEOUS | Status: DC
  Administered 2022-07-29 – 2022-08-05 (×8): 6 via TOPICAL

## 2022-07-29 MED ORDER — LOSARTAN POTASSIUM 50 MG PO TABS: 50.0000 mg | ORAL_TABLET | Freq: Every day | ORAL | Status: DC

## 2022-07-29 MED ORDER — MILRINONE LACTATE IN DEXTROSE 20-5 MG/100ML-% IV SOLN
0.1250 ug/kg/min | INTRAVENOUS | Status: DC
  Administered 2022-07-29: 0.125 ug/kg/min via INTRAVENOUS
  Administered 2022-07-29: 0.25 ug/kg/min via INTRAVENOUS
  Administered 2022-07-30: 0.125 ug/kg/min via INTRAVENOUS
  Filled 2022-07-29 (×3): qty 100

## 2022-07-29 MED ORDER — DIGOXIN 125 MCG PO TABS
0.1250 mg | ORAL_TABLET | Freq: Every day | ORAL | Status: DC
  Administered 2022-07-29 – 2022-08-05 (×8): 0.125 mg via ORAL
  Filled 2022-07-29 (×8): qty 1

## 2022-07-29 MED ORDER — SODIUM CHLORIDE 0.9 % IV BOLUS
250.0000 mL | Freq: Once | INTRAVENOUS | Status: AC
  Administered 2022-07-29: 250 mL via INTRAVENOUS

## 2022-07-29 MED ORDER — SPIRONOLACTONE 25 MG PO TABS
25.0000 mg | ORAL_TABLET | Freq: Every day | ORAL | Status: DC
  Administered 2022-07-29 – 2022-08-05 (×8): 25 mg via ORAL
  Filled 2022-07-29 (×8): qty 1

## 2022-07-29 MED ORDER — EMPAGLIFLOZIN 10 MG PO TABS
10.0000 mg | ORAL_TABLET | Freq: Every day | ORAL | Status: DC
  Administered 2022-07-29: 10 mg via ORAL
  Filled 2022-07-29: qty 1

## 2022-07-29 MED ORDER — CYCLOBENZAPRINE HCL 5 MG PO TABS
5.0000 mg | ORAL_TABLET | Freq: Three times a day (TID) | ORAL | Status: DC | PRN
  Administered 2022-07-29: 5 mg via ORAL
  Filled 2022-07-29: qty 1

## 2022-07-29 NOTE — Consult Note (Signed)
Advanced Heart Failure Team Consult Note   Primary Physician: [facility] PCP-Cardiologist:  None  Reason for Consultation: Acute systolic HF   HPI:    Shrey Boike is seen today for evaluation of acute systolic HF at the request of Dr. Gasper Sells.   Mr. Davie is a 66 y/o male. [employer] with h/o COPD, OSA, HTN, HCV, colon CA with met to lung 2001 s/p chemo with colon/lung resection. Denies h/o known heart disease. Smoked for a long time but quit in 2001 when he was found to have colon/lung/prostate CA  Developed SOB several weeks ago and was evaluated at Urgent Care. Treated with steroids and nebs. Symptoms progressed with worsening SOB, cough, LE edema and orthopnea. At that time found to have BNP (699) along with mild hsT elevation (32-22). Covid PCR negative. CXR with R pleural effusion and CTPE negative for PE. TSH nl. The plan was to admit to the hospital for further evaluation and management of presumed HF however the patient left AMA after prolonged wait time for admission.    He presented back to the [facility] ED on 07/28/22 with ongoing symptoms. BNP up to 1204. Admitted for HF. Denies CP.   Echo EF < 20% RV mild to moderately down. Moderate to severe functional MR.  Started on IV lasix with symptom improvement. Lactate now back at 3.2    Review of Systems: [y] = yes, [ ]  = no   General: Weight gain [ ] ; Weight loss [ ] ; Anorexia [ ] ; Fatigue [ ] ; Fever [ ] ; Chills [ ] ; Weakness [ ]   Cardiac: Chest pain/pressure [ ] ; Resting SOB [ ] ; Exertional SOB [ y]; Orthopnea [ ] y; Pedal Edema [ ] y; Palpitations [ ] ; Syncope [ ] ; Presyncope [ ] ; Paroxysmal nocturnal dyspnea[ ]   Pulmonary: Cough [ ] ; Pryor Montes ]; Hemoptysis[ ] ; Sputum [ ] ; Snoring [ ]   GI: Vomiting[ ] ; Dysphagia[ ] ; Melena[ ] ; Hematochezia [ ] ; Heartburn[ ] ; Abdominal pain [ ] ; Constipation [ ] ; Diarrhea [ ] ; BRBPR [ ]   GU: Hematuria[ ] ; Dysuria [ ] ; Nocturia[ ]   Vascular: Pain in legs with walking [  ]; Pain in feet with lying flat [ ] ; Non-healing sores [ ] ; Stroke [ ] ; TIA [ ] ; Slurred speech [ ] ;  Neuro: Headaches[ ] ; Vertigo[ ] ; Seizures[ ] ; Paresthesias[ ] ;Blurred vision [ ] ; Diplopia [ ] ; Vision changes [ ]   Ortho/Skin: Arthritis [ y]; Joint pain [ y]; Muscle pain [ ] ; Joint swelling [ ] ; Back Pain [ ] ; Rash [ ]   Psych: Depression[ ] ; Anxiety[ ]   Heme: Bleeding problems [ ] ; Clotting disorders [ ] ; Anemia [ ]   Endocrine: Diabetes [ ] ; Thyroid dysfunction[ ]   Home Medications Prior to Admission medications   Medication Sig Start Date End Date Taking? Authorizing Provider  albuterol (PROVENTIL HFA) 108 (90 Base) MCG/ACT inhaler Inhale 2 puffs into the lungs. Every 4 to 6 hours as needed for wheezing and shortness of breath   Yes [provider]  docusate sodium (COLACE) 100 MG capsule Take 100 mg by mouth daily as needed for constipation.   Yes [provider]  fluticasone (FLONASE) 50 MCG/ACT nasal spray 1 spray by Nasal route 2 (two) times daily.     Yes [provider]  gabapentin (NEURONTIN) 600 MG tablet Take 600 mg by mouth 3 (three) times daily. 01/28/18  Yes [provider]  HYDROcodone-acetaminophen (NORCO/VICODIN) 5-325 MG tablet Take 1 tablet by mouth every 6 (six) hours as needed for  moderate pain. 06/13/30  Yes Delora Fuel, MD  lisinopril (PRINIVIL,ZESTRIL) 40 MG tablet Take 40 mg by mouth daily.   Yes [provider]  loratadine (CLARITIN) 10 MG tablet Take 10 mg by mouth daily.   Yes [provider]  Magnesium Oxide (MAOX) 420 MG TABS Take 400 mg by mouth daily.   Yes [provider]  methocarbamol (ROBAXIN) 500 MG tablet Take 500 mg by mouth 2 (two) times daily.   Yes [provider]  Naproxen (NAPROSYN PO) Take 1 tablet by mouth daily as needed (pain).    Yes [provider]  sertraline (ZOLOFT) 100 MG tablet Take 100 mg by mouth daily.   Yes [provider]  Tetrahydrozoline HCl  (VISINE OP) Place 1 drop into both eyes daily as needed (red eye).   Yes [provider]  tiotropium (SPIRIVA) 18 MCG inhalation capsule Place 18 mcg into inhaler and inhale daily.   Yes [provider]  benzonatate (TESSALON) 100 MG capsule Take 1 capsule (100 mg total) by mouth every 8 (eight) hours as needed for cough. Patient not taking: Reported on 07/28/2022 07/13/22   Teodora Medici, FNP  doxycycline (VIBRAMYCIN) 100 MG capsule Take 1 capsule (100 mg total) by mouth 2 (two) times daily. Patient not taking: Reported on 07/28/2022 07/13/22   Teodora Medici, FNP  leuprolide, 6 Month, (LEUPROLIDE ACETATE, 6 MONTH,) 45 MG injection Inject 45 mg into the skin. Every 6 months    [provider]  losartan (COZAAR) 50 MG tablet Take 50 mg by mouth daily.    [provider]    Past Medical History: Past Medical History:  Diagnosis Date   Cancer ([place])    colon cancer 2001, prostate cancer 2011   COPD (chronic obstructive pulmonary disease) (HCC)    Emphysema of lung (HCC)    Hypertension    PTSD (post-traumatic stress disorder)    Seasonal allergies     Past Surgical History: Past Surgical History:  Procedure Laterality Date   BREAST SURGERY     COLON RESECTION  12/18/1999   laceration hand  12/17/1978   right hand   laceration leg  12/17/1980   left leg   LUNG LOBECTOMY     PROSTATECTOMY  09/16/2010    Family History: Family History  Problem Relation Age of Onset   Colon cancer Father 36   Colon cancer Sister 43   Stomach cancer Neg Hx     Social History: Social History   Socioeconomic History   Marital status: Divorced    Spouse name: Not on file   Number of children: Not on file   Years of education: Not on file   Highest education level: Not on file  Occupational History   Not on file  Tobacco Use   Smoking status: Former    Types: Cigarettes    Quit date: 10/23/2009    Years since quitting: 12.7   Smokeless tobacco: Former     Quit date: 10/23/2009  Vaping Use   Vaping Use: Never used  Substance and Sexual Activity   Alcohol use: Yes    Alcohol/week: 14.0 standard drinks of alcohol    Types: 14 Cans of beer per week   Drug use: No   Sexual activity: Not on file  Other Topics Concern   Not on file  Social History Narrative   Not on file   Social Determinants of Health   Financial Resource Strain: Not on file  Food Insecurity:  Not on file  Transportation Needs: Not on file  Physical Activity: Not on file  Stress: Not on file  Social Connections: Not on file    Allergies:  No Known Allergies  Objective:    Vital Signs:   Temp:  [98 F (36.7 C)-99 F (37.2 C)] 98.2 F (36.8 C) (08/13 1100) Pulse Rate:  [100-129] 108 (08/13 1100) Resp:  [14-26] 17 (08/13 1100) BP: (94-151)/(73-122) 122/83 (08/13 1100) SpO2:  [98 %-100 %] 98 % (08/13 1100) Weight:  [104.2 kg] 104.2 kg (08/13 0120)    Weight change: Filed Weights   07/28/22 2251 07/29/22 0120  Weight: 104.2 kg 104.2 kg    Intake/Output:   Intake/Output Summary (Last 24 hours) at 07/29/2022 1156 Last data filed at 07/29/2022 1104 Gross per 24 hour  Intake 480 ml  Output 1550 ml  Net -1070 ml      Physical Exam    General:  Siting up in bed  No resp difficulty HEENT: normal Neck: supple. JVP flat . Carotids 2+ bilat; no bruits. No lymphadenopathy or thyromegaly appreciated. Cor: PMI nondisplaced. Regular tchy Lungs: clear Abdomen: soft, nontender, nondistended. No hepatosplenomegaly. No bruits or masses. Good bowel sounds. Extremities: no cyanosis, clubbing, rash, edema Neuro: alert & orientedx3, cranial nerves grossly intact. moves all 4 extremities w/o difficulty. Affect pleasant   Telemetry   Sinus tach 100-105  EKG    Sinus tach 128 QRS 86  nonspecific ST-T abnormality Personally reviewed   Labs   Basic Metabolic Panel: Recent Labs  Lab 07/26/22 0716 07/28/22 1706 07/28/22 2140 07/29/22 0242  NA 140 139  --   139  K 4.5 4.2  --  4.3  CL 107 109  --  107  CO2 26 21*  --  21*  GLUCOSE 141* 142*  --  141*  BUN 19 17  --  16  CREATININE 1.28* 1.27*  --  1.36*  CALCIUM 9.0 9.0  --  8.7*  MG  --   --  2.1  --     Liver Function Tests: Recent Labs  Lab 07/29/22 0242  AST 29  ALT 25  ALKPHOS 47  BILITOT 0.6  PROT 6.1*  ALBUMIN 3.3*   No results for input(s): "LIPASE", "AMYLASE" in the last 168 hours. No results for input(s): "AMMONIA" in the last 168 hours.  CBC: Recent Labs  Lab 07/26/22 0716 07/28/22 1706 07/29/22 0242  WBC 10.7* 9.9 9.7  NEUTROABS 7.0  --   --   HGB 12.7* 12.3* 12.2*  HCT 40.3 39.0 38.6*  MCV 91.8 91.3 90.8  PLT 212 225 211    Cardiac Enzymes: No results for input(s): "CKTOTAL", "CKMB", "CKMBINDEX", "TROPONINI" in the last 168 hours.  BNP: BNP (last 3 results) Recent Labs    07/26/22 0716 07/28/22 1706  BNP 699.3* 1,204.3*    ProBNP (last 3 results) No results for input(s): "PROBNP" in the last 8760 hours.   CBG: No results for input(s): "GLUCAP" in the last 168 hours.  Coagulation Studies: No results for input(s): "LABPROT", "INR" in the last 72 hours.   Imaging   ECHOCARDIOGRAM COMPLETE  Result Date: 07/29/2022    ECHOCARDIOGRAM REPORT   Patient Name:   AAMARI STRAWDERMAN Date of Exam: 07/29/2022 Medical Rec #:  [id number]   Height:       71.0 in Accession #:    [id number]  Weight:       229.8 lb Date of Birth:  Jan 21, 1956   BSA:  2.237 m Patient Age:    30 years    BP:           126/97 mmHg Patient Gender: M           HR:           74 bpm. Exam Location:  Inpatient Procedure: 2D Echo, Cardiac Doppler, Color Doppler and Intracardiac            Opacification Agent Indications:    Dyspnea R06.00  History:        Patient has no prior history of Echocardiogram examinations.                 COPD, Signs/Symptoms:Dyspnea; Risk Factors:Hypertension.  Sonographer:    Bernadene Person RDCS Referring Phys: [id number] [place]  1.  Severely reduced LV function. LVOT VTI ~9 cm. CO 3 L/min @ HR 100 bpm. CI 1.4 L/min/m2. No LV thrombus on contrast imaging. Left ventricular ejection fraction, by estimation, is <20%. The left ventricle has severely decreased function. The left ventricle demonstrates global hypokinesis. The left ventricular internal cavity size was moderately to severely dilated. Indeterminate diastolic filling due to E-A fusion.  2. Right ventricular systolic function is severely reduced. The right ventricular size is mildly enlarged. There is mildly elevated pulmonary artery systolic pressure. The estimated right ventricular systolic pressure is 69.6 mmHg.  3. Left atrial size was mildly dilated.  4. Moderate to severe MR. Best seen on apical 3c view. Likely functional. The mitral valve is grossly normal. Moderate to severe mitral valve regurgitation. No evidence of mitral stenosis.  5. The aortic valve is tricuspid. Aortic valve regurgitation is not visualized. No aortic stenosis is present.  6. The inferior vena cava is normal in size with <50% respiratory variability, suggesting right atrial pressure of 8 mmHg. FINDINGS  Left Ventricle: Severely reduced LV function. LVOT VTI ~9 cm. CO 3 L/min @ HR 100 bpm. CI 1.4 L/min/m2. No LV thrombus on contrast imaging. Left ventricular ejection fraction, by estimation, is <20%. The left ventricle has severely decreased function. The left ventricle demonstrates global hypokinesis. Definity contrast agent was given IV to delineate the left ventricular endocardial borders. The left ventricular internal cavity size was moderately to severely dilated. There is no left ventricular hypertrophy. Indeterminate diastolic filling due to E-A fusion. Right Ventricle: The right ventricular size is mildly enlarged. No increase in right ventricular wall thickness. Right ventricular systolic function is severely reduced. There is mildly elevated pulmonary artery systolic pressure. The tricuspid  regurgitant velocity is 2.85 m/s, and with an assumed right atrial pressure of 8 mmHg, the estimated right ventricular systolic pressure is 78.9 mmHg. Left Atrium: Left atrial size was mildly dilated. Right Atrium: Right atrial size was normal in size. Pericardium: Trivial pericardial effusion is present. Mitral Valve: Moderate to severe MR. Best seen on apical 3c view. Likely functional. The mitral valve is grossly normal. Moderate to severe mitral valve regurgitation. No evidence of mitral valve stenosis. Tricuspid Valve: The tricuspid valve is grossly normal. Tricuspid valve regurgitation is mild . No evidence of tricuspid stenosis. Aortic Valve: The aortic valve is tricuspid. Aortic valve regurgitation is not visualized. No aortic stenosis is present. Pulmonic Valve: The pulmonic valve was grossly normal. Pulmonic valve regurgitation is mild. No evidence of pulmonic stenosis. Aorta: The aortic root and ascending aorta are structurally normal, with no evidence of dilitation. Venous: The inferior vena cava is normal in size with less than 50% respiratory variability, suggesting right atrial pressure  of 8 mmHg. IAS/Shunts: The atrial septum is grossly normal.  LEFT VENTRICLE PLAX 2D LVIDd:         6.70 cm LVIDs:         5.70 cm LV PW:         0.90 cm LV IVS:        0.80 cm LVOT diam:     2.10 cm LV SV:         30 LV SV Index:   13 LVOT Area:     3.46 cm  LV Volumes (MOD) LV vol d, MOD A2C: 158.0 ml LV vol d, MOD A4C: 167.0 ml LV vol s, MOD A2C: 104.0 ml LV vol s, MOD A4C: 109.0 ml LV SV MOD A2C:     54.0 ml LV SV MOD A4C:     167.0 ml LV SV MOD BP:      56.8 ml RIGHT VENTRICLE TAPSE (M-mode): 1.4 cm LEFT ATRIUM             Index        RIGHT ATRIUM           Index LA diam:        4.30 cm 1.92 cm/m   RA Area:     16.00 cm LA Vol (A2C):   77.8 ml 34.78 ml/m  RA Volume:   38.00 ml  16.99 ml/m LA Vol (A4C):   65.5 ml 29.28 ml/m LA Biplane Vol: 71.4 ml 31.92 ml/m  AORTIC VALVE LVOT Vmax:   67.28 cm/s LVOT Vmean:   40.650 cm/s LVOT VTI:    0.086 m  AORTA Ao Root diam: 3.20 cm Ao Asc diam:  3.20 cm MR Peak grad:    59.3 mmHg    TRICUSPID VALVE MR Mean grad:    46.5 mmHg    TR Peak grad:   32.5 mmHg MR Vmax:         385.00 cm/s  TR Vmax:        285.00 cm/s MR Vmean:        331.5 cm/s MR PISA:         0.57 cm     SHUNTS MR PISA Eff ROA: 6 mm        Systemic VTI:  0.09 m MR PISA Radius:  0.30 cm      Systemic Diam: 2.10 cm Eleonore Chiquito MD Electronically signed by Eleonore Chiquito MD Signature Date/Time: 07/29/2022/11:00:11 AM    Final    DG Chest 2 View  Result Date: 07/28/2022 CLINICAL DATA:  Chest pain and shortness of breath EXAM: CHEST - 2 VIEW COMPARISON:  Radiographs 07/26/2022 FINDINGS: No change from 07/26/2022. Small right pleural effusion. Probable bibasilar atelectasis. Stable cardiomediastinal silhouette. No acute osseous abnormality. IMPRESSION: Unchanged small right pleural effusion and presumed basilar atelectasis. Electronically Signed   By: Placido Sou M.D.   On: 07/28/2022 17:39     Medications:     Current Medications:  digoxin  0.125 mg Oral Daily   empagliflozin  10 mg Oral Daily   enoxaparin (LOVENOX) injection  40 mg Subcutaneous Q24H   furosemide  80 mg Intravenous BID   gabapentin  400 mg Oral TID   [START ON 07/30/2022] losartan  50 mg Oral Daily   pravastatin  10 mg Oral Daily   sodium chloride flush  3 mL Intravenous Q12H   sodium chloride flush  3 mL Intravenous Q12H   spironolactone  25 mg Oral Daily   umeclidinium bromide  1  puff Inhalation Daily    Infusions:   Assessment/Plan   1. Acute systolic HF - new diagnosis EF < 20% on echo with mild to moderate RV dysfunction and moderate to severe functional MR  - Etiology unclear - Volume statu looks ok currently - agree with spiro and Jardiance - add digoxin - Lactate back at 3.2 c/w early shock. Place PICC. Start milrinone  - R/L cath once diuresed - If cath unrevealing plan cMRI  2. Mitral regurgitation -  functional - hopefully will improve with treatment of HF  3. COPD - stable quit smoking 2001  4. H/o colon & Lung CA s/p chemo with partial colectomy/lung lobectomy in 2001 - details unclear  5. OSA   Length of Stay: 0  Glori Bickers, MD  07/29/2022, 11:56 AM  Advanced Heart Failure Team Pager [phone] (M-F; 7a - 5p)  Please contact [facility] for night-coverage after hours (4p -7a ) and weekends on [email or web address]  Addendum:  Milrinone started. SBP now 80 (I checked manually) but asymptomatic. Will cut milrinone to 0.125. Give back 250cc NS as he looks dry on exam.   Hold further lasix and Jardiance.   Will recheck lactic acid and follow BP. Low threshold to move to ICU.   D/w 2H. Bed available as needed.  Glori Bickers, MD  1:16 PM

## 2022-07-29 NOTE — Plan of Care (Signed)
A&Ox4, independent, fan & o2 at bedside for comfort - please leave door to hallway half open.    Problem: Education: Goal: Ability to demonstrate management of disease process will improve Outcome: Progressing Goal: Ability to verbalize understanding of medication therapies will improve Outcome: Progressing Goal: Individualized Educational Video(s) Outcome: Progressing   Problem: Activity: Goal: Capacity to carry out activities will improve Outcome: Progressing   Problem: Cardiac: Goal: Ability to achieve and maintain adequate cardiopulmonary perfusion will improve Outcome: Progressing   Problem: Education: Goal: Knowledge of General Education information will improve Description: Including pain rating scale, medication(s)/side effects and non-pharmacologic comfort measures Outcome: Progressing   Problem: Health Behavior/Discharge Planning: Goal: Ability to manage health-related needs will improve Outcome: Progressing   Problem: Clinical Measurements: Goal: Ability to maintain clinical measurements within normal limits will improve Outcome: Progressing Goal: Will remain free from infection Outcome: Progressing Goal: Diagnostic test results will improve Outcome: Progressing Goal: Respiratory complications will improve Outcome: Progressing Goal: Cardiovascular complication will be avoided Outcome: Progressing   Problem: Activity: Goal: Risk for activity intolerance will decrease Outcome: Progressing   Problem: Nutrition: Goal: Adequate nutrition will be maintained Outcome: Progressing   Problem: Coping: Goal: Level of anxiety will decrease Outcome: Progressing   Problem: Elimination: Goal: Will not experience complications related to bowel motility Outcome: Progressing Goal: Will not experience complications related to urinary retention Outcome: Progressing   Problem: Pain Managment: Goal: General experience of comfort will improve Outcome: Progressing    Problem: Safety: Goal: Ability to remain free from injury will improve Outcome: Progressing   Problem: Skin Integrity: Goal: Risk for impaired skin integrity will decrease Outcome: Progressing

## 2022-07-29 NOTE — Progress Notes (Signed)
PROGRESS NOTE    Jose Ferguson  YTK:[id number] DOB: Apr 30, 1956 DOA: 07/28/2022 PCP: [facility]   Brief Narrative: HPI per Dr. Rada Hay Ratay is a 66 y.o. male with medical history significant of hepatitis C, hypertension, breast cancer, colon cancer, prostate cancer, COPD, hyperlipidemia, OSA, obesity, anemia, gout presenting with shortness of breath.   Patient reports 3 weeks of progressively worsening shortness of breath.  Has had associated orthopnea and dyspnea on exertion as well as some palpitations.  Denies any chest pain does report a cough.   He was seen in the ED 2 days ago and was diagnosed with new onset CHF.  He left AMA prior to this diagnosis being communicated to him and did not know that there was a recommendation for admission at that time.  He represented due to continued symptoms.   He denies fevers, chills, abdominal pain, nausea, vomiting, constipation, diarrhea.   ED Course: Vital signs in the ED significant for heart rate in the 110s to 120s, blood pressure in the 751Z to 001V systolic.  Lab work-up included BMP with bicarb 21, creatinine of 1.27 which is stable from 2 days ago however increased from 4 years ago, do not have other labs our system at this time.  Glucose 142.  CBC with hemoglobin stable at 12.3.  BMP still pending but 2 days ago was elevated to 700.  Troponin 31, will get repeat**.  Chest x-ray showed stable right pleural effusion and presumed basilar atelectasis.  Patient received DuoNebs and a dose of Lasix in the ED.  Cardiology was consulted   Assessment & Plan:   Principal Problem:   Acute CHF ([facility]) Active Problems:   HEPATITIS C   Essential hypertension   History of malignant neoplasm of large intestine   PROSTATE CANCER, HX OF   Chronic obstructive pulmonary disease (COPD) (HCC)   Hyperlipidemia   Obesity   Sleep apnea   Low back pain, unspecified   Anemia  1 new onset acute systolic CHF-patient admitted with gradually  worsening shortness of breath dyspnea on exertion weight gain and orthopnea.  He is found to be in fluid overload. His BNP is elevated Chest x-ray with right-sided pleural effusion and cardiomegaly Echo shows EF less than 20% with moderate to severe MR Cardiology following Lasix dose is increased to 80 mg twice a day, continue Cozaar Aldactone lisinopril stopped possible starting Entresto soon On jardiance Possible cath soon    2 history of essential hypertension continue above medications and monitor  3 obstructive sleep apnea does not want to use CPAP  4 COPD nebulizer treatments patient is on room air and tolerating well  5 hyperlipidemia on statin  Lipid panel in am Ldl high in 2011  6 chronic pain continue home meds  7 chronic hepatitis C   8 prostate cancer on Xtandi    Estimated body mass index is 32.05 kg/m as calculated from the following:   Height as of this encounter: '5\' 11"'$  (1.803 m).   Weight as of this encounter: 104.2 kg.  DVT prophylaxis: Lovenox Code Status: Full code  family Communication: None at bedside  disposition Plan:  Status is: Inpatient The patient will require care spanning > 2 midnights and should be moved to inpatient because: Acute new onset CHF   Consultants:  Cardiology  Procedures: None Antimicrobials: None   Subjective: Patient is sitting up by the side of the bed he was anxious overnight so was placed on oxygen Slept with head  end of the bed elevated  Objective: Vitals:   07/29/22 0520 07/29/22 0741 07/29/22 0743 07/29/22 1100  BP: (!) 126/97 94/73 111/88 122/83  Pulse: 100   (!) 108  Resp: '18 17  17  '$ Temp: 98 F (36.7 C) 98.2 F (36.8 C)  98.2 F (36.8 C)  TempSrc: Oral Oral  Oral  SpO2: 100% 100%  98%  Weight:      Height:        Intake/Output Summary (Last 24 hours) at 07/29/2022 1141 Last data filed at 07/29/2022 1104 Gross per 24 hour  Intake 480 ml  Output 1550 ml  Net -1070 ml   Filed Weights   07/28/22  2251 07/29/22 0120  Weight: 104.2 kg 104.2 kg    Examination:  General exam: Appears anxious Respiratory system: Clear to auscultation. Respiratory effort normal. Cardiovascular system: S1 & S2 heard, RRR. No JVD, murmurs, rubs, gallops or clicks.  1+ pedal edema. Gastrointestinal system: Abdomen is nondistended, soft and nontender. No organomegaly or masses felt. Normal bowel sounds heard. Central nervous system: Alert and oriented. No focal neurological deficits. Extremities: 1+ edema  skin: No rashes, lesions or ulcers Psychiatry: Judgement and insight appear normal. Mood & affect appropriate.     Data Reviewed: I have personally reviewed following labs and imaging studies  CBC: Recent Labs  Lab 07/26/22 0716 07/28/22 1706 07/29/22 0242  WBC 10.7* 9.9 9.7  NEUTROABS 7.0  --   --   HGB 12.7* 12.3* 12.2*  HCT 40.3 39.0 38.6*  MCV 91.8 91.3 90.8  PLT 212 225 629   Basic Metabolic Panel: Recent Labs  Lab 07/26/22 0716 07/28/22 1706 07/28/22 2140 07/29/22 0242  NA 140 139  --  139  K 4.5 4.2  --  4.3  CL 107 109  --  107  CO2 26 21*  --  21*  GLUCOSE 141* 142*  --  141*  BUN 19 17  --  16  CREATININE 1.28* 1.27*  --  1.36*  CALCIUM 9.0 9.0  --  8.7*  MG  --   --  2.1  --    GFR: Estimated Creatinine Clearance: 63.9 mL/min (A) (by C-G formula based on SCr of 1.36 mg/dL (H)). Liver Function Tests: Recent Labs  Lab 07/29/22 0242  AST 29  ALT 25  ALKPHOS 47  BILITOT 0.6  PROT 6.1*  ALBUMIN 3.3*   No results for input(s): "LIPASE", "AMYLASE" in the last 168 hours. No results for input(s): "AMMONIA" in the last 168 hours. Coagulation Profile: No results for input(s): "INR", "PROTIME" in the last 168 hours. Cardiac Enzymes: No results for input(s): "CKTOTAL", "CKMB", "CKMBINDEX", "TROPONINI" in the last 168 hours. BNP (last 3 results) No results for input(s): "PROBNP" in the last 8760 hours. HbA1C: No results for input(s): "HGBA1C" in the last 72  hours. CBG: No results for input(s): "GLUCAP" in the last 168 hours. Lipid Profile: No results for input(s): "CHOL", "HDL", "LDLCALC", "TRIG", "CHOLHDL", "LDLDIRECT" in the last 72 hours. Thyroid Function Tests: No results for input(s): "TSH", "T4TOTAL", "FREET4", "T3FREE", "THYROIDAB" in the last 72 hours. Anemia Panel: No results for input(s): "VITAMINB12", "FOLATE", "FERRITIN", "TIBC", "IRON", "RETICCTPCT" in the last 72 hours. Sepsis Labs: Recent Labs  Lab 07/29/22 0757  LATICACIDVEN 1.6    Recent Results (from the past 240 hour(s))  SARS Coronavirus 2 by RT PCR (hospital order, performed in [facility] hospital lab) *cepheid single result test* Anterior Nasal Swab     Status: None   Collection  Time: 07/26/22  9:34 AM   Specimen: Anterior Nasal Swab  Result Value Ref Range Status   SARS Coronavirus 2 by RT PCR NEGATIVE NEGATIVE Final    Comment: (NOTE) SARS-CoV-2 target nucleic acids are NOT DETECTED.  The SARS-CoV-2 RNA is generally detectable in upper and lower respiratory specimens during the acute phase of infection. The lowest concentration of SARS-CoV-2 viral copies this assay can detect is 250 copies / mL. A negative result does not preclude SARS-CoV-2 infection and should not be used as the sole basis for treatment or other patient management decisions.  A negative result may occur with improper specimen collection / handling, submission of specimen other than nasopharyngeal swab, presence of viral mutation(s) within the areas targeted by this assay, and inadequate number of viral copies (<250 copies / mL). A negative result must be combined with clinical observations, patient history, and epidemiological information.  Fact Sheet for Patients:   https://www.patel.info/  Fact Sheet for Healthcare Providers: https://hall.com/  This test is not yet approved or  cleared by the [place] FDA and has been authorized for  detection and/or diagnosis of SARS-CoV-2 by FDA under an Emergency Use Authorization (EUA).  This EUA will remain in effect (meaning this test can be used) for the duration of the COVID-19 declaration under Section 564(b)(1) of the Act, 21 U.S.C. section 360bbb-3(b)(1), unless the authorization is terminated or revoked sooner.  Performed at [facility], [place] [address], [place], [place] [address]          Radiology Studies: ECHOCARDIOGRAM COMPLETE  Result Date: 07/29/2022    ECHOCARDIOGRAM REPORT   Patient Name:   OBDULIO MASH Date of Exam: 07/29/2022 Medical Rec #:  [id number]   Height:       71.0 in Accession #:    [id number]  Weight:       229.8 lb Date of Birth:  1956-11-06   BSA:          2.237 m Patient Age:    15 years    BP:           126/97 mmHg Patient Gender: M           HR:           74 bpm. Exam Location:  Inpatient Procedure: 2D Echo, Cardiac Doppler, Color Doppler and Intracardiac            Opacification Agent Indications:    Dyspnea R06.00  History:        Patient has no prior history of Echocardiogram examinations.                 COPD, Signs/Symptoms:Dyspnea; Risk Factors:Hypertension.  Sonographer:    Bernadene Person RDCS Referring Phys: [id number] [place]  1. Severely reduced LV function. LVOT VTI ~9 cm. CO 3 L/min @ HR 100 bpm. CI 1.4 L/min/m2. No LV thrombus on contrast imaging. Left ventricular ejection fraction, by estimation, is <20%. The left ventricle has severely decreased function. The left ventricle demonstrates global hypokinesis. The left ventricular internal cavity size was moderately to severely dilated. Indeterminate diastolic filling due to E-A fusion.  2. Right ventricular systolic function is severely reduced. The right ventricular size is mildly enlarged. There is mildly elevated pulmonary artery systolic pressure. The estimated right ventricular systolic pressure is 22.9 mmHg.  3. Left atrial size was mildly dilated.  4.  Moderate to severe MR. Best seen on apical 3c view. Likely functional. The mitral valve is grossly normal. Moderate  to severe mitral valve regurgitation. No evidence of mitral stenosis.  5. The aortic valve is tricuspid. Aortic valve regurgitation is not visualized. No aortic stenosis is present.  6. The inferior vena cava is normal in size with <50% respiratory variability, suggesting right atrial pressure of 8 mmHg. FINDINGS  Left Ventricle: Severely reduced LV function. LVOT VTI ~9 cm. CO 3 L/min @ HR 100 bpm. CI 1.4 L/min/m2. No LV thrombus on contrast imaging. Left ventricular ejection fraction, by estimation, is <20%. The left ventricle has severely decreased function. The left ventricle demonstrates global hypokinesis. Definity contrast agent was given IV to delineate the left ventricular endocardial borders. The left ventricular internal cavity size was moderately to severely dilated. There is no left ventricular hypertrophy. Indeterminate diastolic filling due to E-A fusion. Right Ventricle: The right ventricular size is mildly enlarged. No increase in right ventricular wall thickness. Right ventricular systolic function is severely reduced. There is mildly elevated pulmonary artery systolic pressure. The tricuspid regurgitant velocity is 2.85 m/s, and with an assumed right atrial pressure of 8 mmHg, the estimated right ventricular systolic pressure is 40.9 mmHg. Left Atrium: Left atrial size was mildly dilated. Right Atrium: Right atrial size was normal in size. Pericardium: Trivial pericardial effusion is present. Mitral Valve: Moderate to severe MR. Best seen on apical 3c view. Likely functional. The mitral valve is grossly normal. Moderate to severe mitral valve regurgitation. No evidence of mitral valve stenosis. Tricuspid Valve: The tricuspid valve is grossly normal. Tricuspid valve regurgitation is mild . No evidence of tricuspid stenosis. Aortic Valve: The aortic valve is tricuspid. Aortic valve  regurgitation is not visualized. No aortic stenosis is present. Pulmonic Valve: The pulmonic valve was grossly normal. Pulmonic valve regurgitation is mild. No evidence of pulmonic stenosis. Aorta: The aortic root and ascending aorta are structurally normal, with no evidence of dilitation. Venous: The inferior vena cava is normal in size with less than 50% respiratory variability, suggesting right atrial pressure of 8 mmHg. IAS/Shunts: The atrial septum is grossly normal.  LEFT VENTRICLE PLAX 2D LVIDd:         6.70 cm LVIDs:         5.70 cm LV PW:         0.90 cm LV IVS:        0.80 cm LVOT diam:     2.10 cm LV SV:         30 LV SV Index:   13 LVOT Area:     3.46 cm  LV Volumes (MOD) LV vol d, MOD A2C: 158.0 ml LV vol d, MOD A4C: 167.0 ml LV vol s, MOD A2C: 104.0 ml LV vol s, MOD A4C: 109.0 ml LV SV MOD A2C:     54.0 ml LV SV MOD A4C:     167.0 ml LV SV MOD BP:      56.8 ml RIGHT VENTRICLE TAPSE (M-mode): 1.4 cm LEFT ATRIUM             Index        RIGHT ATRIUM           Index LA diam:        4.30 cm 1.92 cm/m   RA Area:     16.00 cm LA Vol (A2C):   77.8 ml 34.78 ml/m  RA Volume:   38.00 ml  16.99 ml/m LA Vol (A4C):   65.5 ml 29.28 ml/m LA Biplane Vol: 71.4 ml 31.92 ml/m  AORTIC VALVE LVOT Vmax:   67.28  cm/s LVOT Vmean:  40.650 cm/s LVOT VTI:    0.086 m  AORTA Ao Root diam: 3.20 cm Ao Asc diam:  3.20 cm MR Peak grad:    59.3 mmHg    TRICUSPID VALVE MR Mean grad:    46.5 mmHg    TR Peak grad:   32.5 mmHg MR Vmax:         385.00 cm/s  TR Vmax:        285.00 cm/s MR Vmean:        331.5 cm/s MR PISA:         0.57 cm     SHUNTS MR PISA Eff ROA: 6 mm        Systemic VTI:  0.09 m MR PISA Radius:  0.30 cm      Systemic Diam: 2.10 cm Eleonore Chiquito MD Electronically signed by Eleonore Chiquito MD Signature Date/Time: 07/29/2022/11:00:11 AM    Final    DG Chest 2 View  Result Date: 07/28/2022 CLINICAL DATA:  Chest pain and shortness of breath EXAM: CHEST - 2 VIEW COMPARISON:  Radiographs 07/26/2022 FINDINGS: No change  from 07/26/2022. Small right pleural effusion. Probable bibasilar atelectasis. Stable cardiomediastinal silhouette. No acute osseous abnormality. IMPRESSION: Unchanged small right pleural effusion and presumed basilar atelectasis. Electronically Signed   By: Placido Sou M.D.   On: 07/28/2022 17:39        Scheduled Meds:  digoxin  0.125 mg Oral Daily   empagliflozin  10 mg Oral Daily   enoxaparin (LOVENOX) injection  40 mg Subcutaneous Q24H   furosemide  80 mg Intravenous BID   gabapentin  400 mg Oral TID   [START ON 07/30/2022] losartan  50 mg Oral Daily   pravastatin  10 mg Oral Daily   sodium chloride flush  3 mL Intravenous Q12H   sodium chloride flush  3 mL Intravenous Q12H   spironolactone  25 mg Oral Daily   umeclidinium bromide  1 puff Inhalation Daily   Continuous Infusions:   LOS: 0 days    Time spent: 3 2 minutes  Georgette Shell, MD 07/29/2022, 11:41 AM

## 2022-07-29 NOTE — Progress Notes (Signed)
  Echocardiogram 2D Echocardiogram has been performed.  Fidel Levy 07/29/2022, 10:15 AM

## 2022-07-29 NOTE — Progress Notes (Addendum)
Personally seen and examined. Agree with Overnight colleague with the following comments:  Briefly 66 yo M with HTN, COPD, Prior lung and colon Cancer, [facility] ([facility]; he is [facility]) Who presents with SOB found to have severe HF  Patient notes that with diuresis he is able to ambulated to restroom with improved DOE.  Does not stress full event, prior smoking history, no Fhx cardiomyopathy No CP Notes SOB No Palpitations No syncope.  Exam notable for JVD to mandible, regular tachycardia, abdomina swelling with dullness to perfusion, minimal bilateral edema, CTAB Tele shows Sinus tachy with PVCs Echo will be read shortle; severe LV dysfunction with moderate ventricular functional MR  Would recommend  - increase lasix to 80 IB BID - adding both aldactone and SGLT2i; - I stopped his lisinopril after today; he will get losartan tomorrow and potentially Entresto on Tuesday - Sinus tachy is reactive; will hold BB; concerning for low output state; Cr in mildly elevated  - peri-discharge will need follow up with [facility]; he is service connected and will be able to get some medications there  Jose Haskell, MD [place]  [address] [place], [place] [address] [phone]  10:55 AM  Risks and benefits of cardiac catheterization have been discussed with the patient.  These include bleeding, infection, kidney damage, stroke, heart attack, death.  The patient understands these risks and is willing to proceed.  Access recommendations: R Radial; R AC vs R IK Procedural considerations Potentially will need leave in [place]  Orders are placed. Discussed with AHF team; they will see him and may delay timing of RHC. I have added lipid panel for tomorrow. I have started digoxin.  Discussed care with patient and wife.  CRITICAL CARE Performed by: Ashvik Grundman A [place]  Total critical care time: 30 minutes.  Critical care time was exclusive of separately billable procedures and treating other patients. Critical care was necessary to treat or prevent imminent or life-threatening deterioration. Critical care was time spent personally by me on the following activities: development of treatment plan with patient and/or surrogate as well as nursing, discussions with consultants, evaluation of patient's response to treatment, examination of patient, obtaining history from patient or surrogate, ordering and performing treatments and interventions, ordering and review of laboratory studies, ordering and review of radiographic studies, pulse oximetry and re-evaluation of patient's condition.    Signed, Jose Haskell, MD [place]  07/29/2022 11:26 AM     Lactate has increased from prior 3.2. Patient eating comfortably. We discussed PICC line and Venous Co-ox.  He is amenable and has no concerns.  Jose Haskell, MD [place]  [address] [place],  [address] [phone]  12:10 PM

## 2022-07-29 NOTE — Progress Notes (Signed)
Peripherally Inserted Central Catheter Placement  The IV Nurse has discussed with the patient and/or persons authorized to consent for the patient, the purpose of this procedure and the potential benefits and risks involved with this procedure.  The benefits include less needle sticks, lab draws from the catheter, and the patient may be discharged home with the catheter. Risks include, but not limited to, infection, bleeding, blood clot (thrombus formation), and puncture of an artery; nerve damage and irregular heartbeat and possibility to perform a PICC exchange if needed/ordered by physician.  Alternatives to this procedure were also discussed.  Bard Power PICC patient education guide, fact sheet on infection prevention and patient information card has been provided to patient /or left at bedside.    PICC Placement Documentation  PICC Double Lumen 41/63/84 Left Basilic 44 cm 0 cm (Active)  Indication for Insertion or Continuance of Line Administration of hyperosmolar/irritating solutions (i.e. TPN, Vancomycin, etc.) 07/29/22 1800  Exposed Catheter (cm) 0 cm 07/29/22 1800  Site Assessment Clean, Dry, Intact 07/29/22 1800  Lumen #1 Status Flushed;Saline locked;Blood return noted 07/29/22 1800  Lumen #2 Status Flushed;Saline locked;Blood return noted 07/29/22 1800  Dressing Type Transparent;Securing device 07/29/22 1800  Dressing Status Antimicrobial disc in place 07/29/22 1800  Safety Lock Not Applicable 53/64/68 0321  Line Care Connections checked and tightened 07/29/22 1800  Line Adjustment (NICU/IV Team Only) No 07/29/22 1800  Dressing Intervention New dressing 07/29/22 1800  Dressing Change Due 08/05/22 07/29/22 1800       [place] 07/29/2022, 6:09 PM

## 2022-07-30 ENCOUNTER — Encounter (HOSPITAL_COMMUNITY)
Admission: EM | Disposition: A | Discharge status: Home / Self Care | Payer: Self-pay | Source: Home / Self Care | Attending: Internal Medicine

## 2022-07-30 ENCOUNTER — Encounter (HOSPITAL_COMMUNITY): Payer: Self-pay | Admitting: Internal Medicine

## 2022-07-30 ENCOUNTER — Telehealth (HOSPITAL_COMMUNITY): Payer: Self-pay | Admitting: Pharmacy Technician

## 2022-07-30 ENCOUNTER — Other Ambulatory Visit (HOSPITAL_COMMUNITY): Payer: Self-pay

## 2022-07-30 DIAGNOSIS — I509 Heart failure, unspecified: Secondary | ICD-10-CM | POA: Diagnosis not present

## 2022-07-30 DIAGNOSIS — I493 Ventricular premature depolarization: Secondary | ICD-10-CM

## 2022-07-30 DIAGNOSIS — B1710 Acute hepatitis C without hepatic coma: Secondary | ICD-10-CM | POA: Diagnosis not present

## 2022-07-30 HISTORY — PX: RIGHT/LEFT HEART CATH AND CORONARY ANGIOGRAPHY: CATH118266

## 2022-07-30 LAB — POCT I-STAT EG7
Acid-Base Excess: 1 mmol/L (ref 0.0–2.0)
Acid-Base Excess: 0 mmol/L (ref 0.0–2.0)
Bicarbonate: 26.4 mmol/L (ref 20.0–28.0)
Bicarbonate: 25.2 mmol/L (ref 20.0–28.0)
Calcium, Ion: 1.21 mmol/L (ref 1.15–1.40)
Calcium, Ion: 1.23 mmol/L (ref 1.15–1.40)
HCT: 39 % (ref 39.0–52.0)
HCT: 38 % — ABNORMAL LOW (ref 39.0–52.0)
Hemoglobin: 13.3 g/dL (ref 13.0–17.0)
Hemoglobin: 12.9 g/dL — ABNORMAL LOW (ref 13.0–17.0)
O2 Saturation: 58 %
O2 Saturation: 57 %
Potassium: 4.1 mmol/L (ref 3.5–5.1)
Potassium: 4 mmol/L (ref 3.5–5.1)
Sodium: 142 mmol/L (ref 135–145)
Sodium: 142 mmol/L (ref 135–145)
TCO2: 28 mmol/L (ref 22–32)
TCO2: 27 mmol/L (ref 22–32)
pCO2, Ven: 45.9 mmHg (ref 44–60)
pCO2, Ven: 44.2 mmHg (ref 44–60)
pH, Ven: 7.367 (ref 7.25–7.43)
pH, Ven: 7.364 (ref 7.25–7.43)
pO2, Ven: 31 mmHg — CL (ref 32–45)
pO2, Ven: 31 mmHg — CL (ref 32–45)

## 2022-07-30 LAB — POCT I-STAT 7, (LYTES, BLD GAS, ICA,H+H)
Acid-base deficit: 2 mmol/L (ref 0.0–2.0)
Bicarbonate: 22.7 mmol/L (ref 20.0–28.0)
Calcium, Ion: 1.14 mmol/L — ABNORMAL LOW (ref 1.15–1.40)
HCT: 38 % — ABNORMAL LOW (ref 39.0–52.0)
Hemoglobin: 12.9 g/dL — ABNORMAL LOW (ref 13.0–17.0)
O2 Saturation: 96 %
Potassium: 3.8 mmol/L (ref 3.5–5.1)
Sodium: 143 mmol/L (ref 135–145)
TCO2: 24 mmol/L (ref 22–32)
pCO2 arterial: 38.1 mmHg (ref 32–48)
pH, Arterial: 7.384 (ref 7.35–7.45)
pO2, Arterial: 85 mmHg (ref 83–108)

## 2022-07-30 LAB — LIPID PANEL
Cholesterol: 132 mg/dL (ref 0–200)
HDL: 30 mg/dL — ABNORMAL LOW (ref >40)
LDL Cholesterol: 79 mg/dL (ref 0–99)
Total CHOL/HDL Ratio: 4.4 RATIO
Triglycerides: 113 mg/dL (ref <150)
VLDL: 23 mg/dL (ref 0–40)

## 2022-07-30 LAB — BASIC METABOLIC PANEL
Anion gap: 8 (ref 5–15)
BUN: 19 mg/dL (ref 8–23)
CO2: 25 mmol/L (ref 22–32)
Calcium: 8.4 mg/dL — ABNORMAL LOW (ref 8.9–10.3)
Chloride: 108 mmol/L (ref 98–111)
Creatinine, Ser: 1.36 mg/dL — ABNORMAL HIGH (ref 0.61–1.24)
GFR, Estimated: 57 mL/min — ABNORMAL LOW (ref >60)
Glucose, Bld: 131 mg/dL — ABNORMAL HIGH (ref 70–99)
Potassium: 4.1 mmol/L (ref 3.5–5.1)
Sodium: 141 mmol/L (ref 135–145)

## 2022-07-30 LAB — COOXEMETRY PANEL
Carboxyhemoglobin: 0.7 % (ref 0.5–1.5)
Methemoglobin: 1 % (ref 0.0–1.5)
O2 Saturation: 55.4 %
Total hemoglobin: 12.2 g/dL (ref 12.0–16.0)

## 2022-07-30 LAB — MRSA NEXT GEN BY PCR, NASAL: MRSA by PCR Next Gen: NOT DETECTED

## 2022-07-30 SURGERY — RIGHT/LEFT HEART CATH AND CORONARY ANGIOGRAPHY
Anesthesia: LOCAL

## 2022-07-30 MED ORDER — SODIUM CHLORIDE 0.9 % IV SOLN: INTRAVENOUS | Status: DC

## 2022-07-30 MED ORDER — MEXILETINE HCL 200 MG PO CAPS
200.0000 mg | ORAL_CAPSULE | Freq: Three times a day (TID) | ORAL | Status: DC
  Administered 2022-07-30 – 2022-08-02 (×9): 200 mg via ORAL
  Filled 2022-07-30 (×10): qty 1

## 2022-07-30 MED ORDER — FENTANYL CITRATE (PF) 100 MCG/2ML IJ SOLN
INTRAMUSCULAR | Status: AC
  Filled 2022-07-30: qty 2

## 2022-07-30 MED ORDER — HYDRALAZINE HCL 20 MG/ML IJ SOLN: 10.0000 mg | INTRAMUSCULAR | Status: AC | PRN

## 2022-07-30 MED ORDER — FENTANYL CITRATE (PF) 100 MCG/2ML IJ SOLN
INTRAMUSCULAR | Status: DC | PRN
  Administered 2022-07-30: 25 ug via INTRAVENOUS

## 2022-07-30 MED ORDER — MIDAZOLAM HCL 2 MG/2ML IJ SOLN
INTRAMUSCULAR | Status: DC | PRN
  Administered 2022-07-30: 1 mg via INTRAVENOUS

## 2022-07-30 MED ORDER — HEPARIN (PORCINE) IN NACL 1000-0.9 UT/500ML-% IV SOLN
INTRAVENOUS | Status: AC
  Filled 2022-07-30: qty 1000

## 2022-07-30 MED ORDER — VERAPAMIL HCL 2.5 MG/ML IV SOLN
INTRAVENOUS | Status: AC
  Filled 2022-07-30: qty 2

## 2022-07-30 MED ORDER — SODIUM CHLORIDE 0.9% FLUSH
3.0000 mL | Freq: Two times a day (BID) | INTRAVENOUS | Status: DC
  Administered 2022-07-31 – 2022-08-01 (×3): 3 mL via INTRAVENOUS

## 2022-07-30 MED ORDER — HEPARIN (PORCINE) IN NACL 1000-0.9 UT/500ML-% IV SOLN
INTRAVENOUS | Status: DC | PRN
  Administered 2022-07-30 (×2): 500 mL

## 2022-07-30 MED ORDER — EMPAGLIFLOZIN 10 MG PO TABS
10.0000 mg | ORAL_TABLET | Freq: Every day | ORAL | Status: DC
  Administered 2022-07-30 – 2022-08-05 (×7): 10 mg via ORAL
  Filled 2022-07-30 (×7): qty 1

## 2022-07-30 MED ORDER — SERTRALINE HCL 100 MG PO TABS
100.0000 mg | ORAL_TABLET | Freq: Every day | ORAL | Status: DC
  Administered 2022-07-30 – 2022-08-05 (×7): 100 mg via ORAL
  Filled 2022-07-30 (×7): qty 1

## 2022-07-30 MED ORDER — SODIUM CHLORIDE 0.9% FLUSH: 3.0000 mL | INTRAVENOUS | Status: DC | PRN

## 2022-07-30 MED ORDER — MIDAZOLAM HCL 2 MG/2ML IJ SOLN
INTRAMUSCULAR | Status: AC
  Filled 2022-07-30: qty 2

## 2022-07-30 MED ORDER — HEPARIN SODIUM (PORCINE) 1000 UNIT/ML IJ SOLN
INTRAMUSCULAR | Status: AC
  Filled 2022-07-30: qty 10

## 2022-07-30 MED ORDER — ENOXAPARIN SODIUM 40 MG/0.4ML IJ SOSY
40.0000 mg | PREFILLED_SYRINGE | INTRAMUSCULAR | Status: DC
Start: 2022-07-31 — End: 2022-08-05
  Administered 2022-07-31 – 2022-08-05 (×6): 40 mg via SUBCUTANEOUS
  Filled 2022-07-30 (×6): qty 0.4

## 2022-07-30 MED ORDER — LIDOCAINE HCL (PF) 1 % IJ SOLN
INTRAMUSCULAR | Status: DC | PRN
  Administered 2022-07-30 (×2): 2 mL

## 2022-07-30 MED ORDER — ASPIRIN 81 MG PO CHEW: 81.0000 mg | CHEWABLE_TABLET | ORAL | Status: DC

## 2022-07-30 MED ORDER — IOHEXOL 350 MG/ML SOLN
INTRAVENOUS | Status: DC | PRN
  Administered 2022-07-30: 45 mL

## 2022-07-30 MED ORDER — ONDANSETRON HCL 4 MG/2ML IJ SOLN
4.0000 mg | Freq: Four times a day (QID) | INTRAMUSCULAR | Status: DC | PRN
  Filled 2022-07-30: qty 2

## 2022-07-30 MED ORDER — VERAPAMIL HCL 2.5 MG/ML IV SOLN
INTRAVENOUS | Status: DC | PRN
  Administered 2022-07-30: 10 mL via INTRA_ARTERIAL

## 2022-07-30 MED ORDER — METHOCARBAMOL 500 MG PO TABS
500.0000 mg | ORAL_TABLET | Freq: Four times a day (QID) | ORAL | Status: DC | PRN
Start: 2022-07-30 — End: 2022-08-05
  Administered 2022-07-31: 500 mg via ORAL
  Filled 2022-07-30: qty 1

## 2022-07-30 MED ORDER — LIDOCAINE HCL (PF) 1 % IJ SOLN
INTRAMUSCULAR | Status: AC
  Filled 2022-07-30: qty 30

## 2022-07-30 MED ORDER — SODIUM CHLORIDE 0.9% FLUSH
3.0000 mL | INTRAVENOUS | Status: DC | PRN
Start: 2022-07-30 — End: 2022-08-05
  Administered 2022-08-01: 3 mL via INTRAVENOUS

## 2022-07-30 MED ORDER — SODIUM CHLORIDE 0.9 % IV SOLN: INTRAVENOUS | Status: AC

## 2022-07-30 MED ORDER — HEPARIN SODIUM (PORCINE) 1000 UNIT/ML IJ SOLN
INTRAMUSCULAR | Status: DC | PRN
  Administered 2022-07-30: 5000 [IU] via INTRAVENOUS

## 2022-07-30 MED ORDER — ACETAMINOPHEN 325 MG PO TABS
650.0000 mg | ORAL_TABLET | ORAL | Status: DC | PRN
Start: 2022-07-30 — End: 2022-07-31

## 2022-07-30 MED ORDER — SODIUM CHLORIDE 0.9 % IV SOLN
250.0000 mL | INTRAVENOUS | Status: DC | PRN
Start: 2022-07-30 — End: 2022-08-05
  Administered 2022-07-30: 250 mL via INTRAVENOUS

## 2022-07-30 MED ORDER — LABETALOL HCL 5 MG/ML IV SOLN: 10.0000 mg | INTRAVENOUS | Status: AC | PRN

## 2022-07-30 MED ORDER — SODIUM CHLORIDE 0.9 % IV SOLN: 250.0000 mL | INTRAVENOUS | Status: DC | PRN

## 2022-07-30 MED ORDER — ADULT MULTIVITAMIN W/MINERALS CH
1.0000 | ORAL_TABLET | Freq: Every day | ORAL | Status: DC
  Administered 2022-07-30 – 2022-08-05 (×7): 1 via ORAL
  Filled 2022-07-30 (×8): qty 1

## 2022-07-30 SURGICAL SUPPLY — 10 items
BAND ZEPHYR COMPRESS 30 LONG (HEMOSTASIS) ×1 IMPLANT
CATH 5FR JL3.5 JR4 ANG PIG MP (CATHETERS) ×1 IMPLANT
CATH BALLN WEDGE 5F 110CM (CATHETERS) ×1 IMPLANT
GLIDESHEATH SLEND SS 6F .021 (SHEATH) ×1 IMPLANT
GUIDEWIRE .025 260CM (WIRE) ×1 IMPLANT
GUIDEWIRE INQWIRE 1.5J.035X260 (WIRE) IMPLANT
INQWIRE 1.5J .035X260CM (WIRE) ×2
PACK CARDIAC CATHETERIZATION (CUSTOM PROCEDURE TRAY) ×2 IMPLANT
SHEATH GLIDE SLENDER 4/5FR (SHEATH) ×1 IMPLANT
TRANSDUCER W/STOPCOCK (MISCELLANEOUS) ×2 IMPLANT

## 2022-07-30 NOTE — Progress Notes (Signed)
Initial Nutrition Assessment  DOCUMENTATION CODES:   Not applicable  INTERVENTION:   Multivitamin w/ minerals daily Diet Education  NUTRITION DIAGNOSIS:   Increased nutrient needs related to acute illness (new CHF dx) as evidenced by estimated needs.  GOAL:   Patient will meet greater than or equal to 90% of their needs  MONITOR:   PO intake, Labs, I & O's, Weight trends  REASON FOR ASSESSMENT:   Malnutrition Screening Tool    ASSESSMENT:   66 y.o. male presented to the ED with increased shortness of breath. Pt [facility] at [facility] ED 2 days prior and left AMA. PMH includes COPD, colon, prostate, breast, and lung cancer, gout, HTN, and Hepatitis C. Pt admitted with new diagnosis of CHF.   Pt sitting in bedside chair. Reports that he is doing well, states that his appetite has been well. States that he was eating a little less that past few weeks but nothing significant. Shares that he often skips meals, but is not everyday and that him and his wife share responsibility cooking. Denies any nausea or vomiting. Pt currently NPO for heart cath.  Meal completion: 75% of breakfast yesterday  Pt reports a UBW of 211#, denies any recent weight loss. Stated that his wife noticed him gaining weight (fluid) in his legs. No weights within the past year to assess weight loss.   Reviewed checking weight everyday to assess for fluid retention. Reviewed fluid and sodium restriction to help with fluid retention. RD to add handout to discharge instructions.   Medications reviewed and include: Jardiance, Spironolactone Labs reviewed.   NUTRITION - FOCUSED PHYSICAL EXAM:  Flowsheet Row Most Recent Value  Orbital Region No depletion  Upper Arm Region No depletion  Thoracic and Lumbar Region No depletion  Buccal Region No depletion  Temple Region No depletion  Clavicle Bone Region No depletion  Clavicle and Acromion Bone Region No depletion  Scapular Bone Region No depletion  Dorsal Hand  No depletion  Patellar Region No depletion  Anterior Thigh Region No depletion  Posterior Calf Region No depletion  Edema (RD Assessment) None  Hair Reviewed  Eyes Reviewed  Mouth Reviewed  Skin Reviewed  Nails Reviewed   Diet Order:   Diet Order             Diet NPO time specified  Diet effective midnight                   EDUCATION NEEDS:   Education needs have been addressed  Skin:  Skin Assessment: Reviewed RN Assessment  Last BM:  8/12  Height:   Ht Readings from Last 1 Encounters:  07/28/22 '5\' 11"'$  (1.803 m)    Weight:   Wt Readings from Last 1 Encounters:  07/30/22 102.6 kg    Ideal Body Weight:  78.2 kg  BMI:  Body mass index is 31.55 kg/m.  Estimated Nutritional Needs:   Kcal:  2100-2300  Protein:  105-120 grams  Fluid:  >/= 2L    Hermina Barters RD, LDN Clinical Dietitian See [facility] for contact information.

## 2022-07-30 NOTE — Hospital Course (Signed)
66 y.o. male with medical history significant of hepatitis C, hypertension, breast cancer, colon cancer, prostate cancer, COPD, hyperlipidemia, OSA, obesity, anemia, gout presenting with shortness of breath.   Patient reports 3 weeks of progressively worsening shortness of breath.  Has had associated orthopnea and dyspnea on exertion as well as some palpitations.  Denies any chest pain does report a cough.   He was seen in the ED 2 days ago and was diagnosed with new onset CHF.  He left AMA prior to this diagnosis being communicated to him and did not know that there was a recommendation for admission at that time.  He represented due to continued symptoms.   He denies fevers, chills, abdominal pain, nausea, vomiting, constipation, diarrhea.   ED Course: Vital signs in the ED significant for heart rate in the 110s to 120s, blood pressure in the 814G to 818H systolic.  Lab work-up included BMP with bicarb 21, creatinine of 1.27 which is stable from 2 days ago however increased from 4 years ago, do not have other labs our system at this time.  Glucose 142.  CBC with hemoglobin stable at 12.3.  BMP still pending but 2 days ago was elevated to 700.  Troponin 31, will get repeat**.  Chest x-ray showed stable right pleural effusion and presumed basilar atelectasis.  Patient received DuoNebs and a dose of Lasix in the ED.  Cardiology was consulted

## 2022-07-30 NOTE — Telephone Encounter (Signed)
Pharmacy Patient Advocate Encounter  Insurance verification completed.    The patient gets his medications through the [place]  The patient is currently admitted and ran test claims for the following: Jose Ferguson, Jardiance.  Copays and coinsurance results were relayed to Inpatient clinical team.

## 2022-07-30 NOTE — Progress Notes (Addendum)
Advanced Heart Failure Rounding Note  PCP-Cardiologist: None   Subjective:    Yesterday had PICC placed and started on milrinone 0.25 mcg.   Milrinone 0.125 mcg. CO-OX 55%  Denies SOB. Denies chest pain. .    Objective:   Weight Range: 102.6 kg Body mass index is 31.55 kg/m.   Vital Signs:   Temp:  [98.2 F (36.8 C)-98.7 F (37.1 C)] 98.6 F (37 C) (08/14 0750) Pulse Rate:  [75-122] 107 (08/14 0700) Resp:  [9-28] 18 (08/14 0700) BP: (65-122)/(42-101) 113/81 (08/14 0700) SpO2:  [90 %-98 %] 95 % (08/14 0700) Weight:  [102.6 kg] 102.6 kg (08/14 0600) Last BM Date : 07/28/22  Weight change: Filed Weights   07/28/22 2251 07/29/22 0120 07/30/22 0600  Weight: 104.2 kg 104.2 kg 102.6 kg    Intake/Output:   Intake/Output Summary (Last 24 hours) at 07/30/2022 0756 Last data filed at 07/30/2022 0700 Gross per 24 hour  Intake 550.13 ml  Output 2450 ml  Net -1899.87 ml    CVP 7-8   Physical Exam    General:   No resp difficulty HEENT: Normal Neck: Supple. JVP 6-7  . Carotids 2+ bilat; no bruits. No lymphadenopathy or thyromegaly appreciated. Cor: PMI nondisplaced. Regular rate & rhythm. No rubs, gallops or murmurs. Lungs: Clear Abdomen: Soft, nontender, nondistended. No hepatosplenomegaly. No bruits or masses. Good bowel sounds. Extremities: No cyanosis, clubbing, rash, edema. RUE PICC  Neuro: Alert & orientedx3, cranial nerves grossly intact. moves all 4 extremities w/o difficulty. Affect pleasant   Telemetry  SR 80-100s with frequent PVCs.   EKG    N/A   Labs    CBC Recent Labs    07/28/22 1706 07/29/22 0242  WBC 9.9 9.7  HGB 12.3* 12.2*  HCT 39.0 38.6*  MCV 91.3 90.8  PLT 225 700   Basic Metabolic Panel Recent Labs    07/28/22 2140 07/29/22 0242 07/30/22 0525  NA  --  139 141  K  --  4.3 4.1  CL  --  107 108  CO2  --  21* 25  GLUCOSE  --  141* 131*  BUN  --  16 19  CREATININE  --  1.36* 1.36*  CALCIUM  --  8.7* 8.4*  MG 2.1  --    --    Liver Function Tests Recent Labs    07/29/22 0242  AST 29  ALT 25  ALKPHOS 47  BILITOT 0.6  PROT 6.1*  ALBUMIN 3.3*   No results for input(s): "LIPASE", "AMYLASE" in the last 72 hours. Cardiac Enzymes No results for input(s): "CKTOTAL", "CKMB", "CKMBINDEX", "TROPONINI" in the last 72 hours.  BNP: BNP (last 3 results) Recent Labs    07/26/22 0716 07/28/22 1706  BNP 699.3* 1,204.3*    ProBNP (last 3 results) No results for input(s): "PROBNP" in the last 8760 hours.   D-Dimer No results for input(s): "DDIMER" in the last 72 hours. Hemoglobin A1C No results for input(s): "HGBA1C" in the last 72 hours. Fasting Lipid Panel Recent Labs    07/30/22 0525  CHOL 132  HDL 30*  LDLCALC 79  TRIG 113  CHOLHDL 4.4   Thyroid Function Tests No results for input(s): "TSH", "T4TOTAL", "T3FREE", "THYROIDAB" in the last 72 hours.  Invalid input(s): "FREET3"  Other results:   Imaging    [place] EKG SITE RITE  Result Date: 07/29/2022 If Site Rite image not attached, placement could not be confirmed due to current cardiac rhythm.  ECHOCARDIOGRAM COMPLETE  Result  Date: 07/29/2022    ECHOCARDIOGRAM REPORT   Patient Name:   Jose Ferguson Date of Exam: 07/29/2022 Medical Rec #:  [id number]   Height:       71.0 in Accession #:    [id number]  Weight:       229.8 lb Date of Birth:  1956/01/28   BSA:          2.237 m Patient Age:    34 years    BP:           126/97 mmHg Patient Gender: M           HR:           74 bpm. Exam Location:  Inpatient Procedure: 2D Echo, Cardiac Doppler, Color Doppler and Intracardiac            Opacification Agent Indications:    Dyspnea R06.00  History:        Patient has no prior history of Echocardiogram examinations.                 COPD, Signs/Symptoms:Dyspnea; Risk Factors:Hypertension.  Sonographer:    Bernadene Person RDCS Referring Phys: [id number] [place]  1. Severely reduced LV function. LVOT VTI ~9 cm. CO 3 L/min @ HR 100 bpm. CI 1.4  L/min/m2. No LV thrombus on contrast imaging. Left ventricular ejection fraction, by estimation, is <20%. The left ventricle has severely decreased function. The left ventricle demonstrates global hypokinesis. The left ventricular internal cavity size was moderately to severely dilated. Indeterminate diastolic filling due to E-A fusion.  2. Right ventricular systolic function is severely reduced. The right ventricular size is mildly enlarged. There is mildly elevated pulmonary artery systolic pressure. The estimated right ventricular systolic pressure is 30.8 mmHg.  3. Left atrial size was mildly dilated.  4. Moderate to severe MR. Best seen on apical 3c view. Likely functional. The mitral valve is grossly normal. Moderate to severe mitral valve regurgitation. No evidence of mitral stenosis.  5. The aortic valve is tricuspid. Aortic valve regurgitation is not visualized. No aortic stenosis is present.  6. The inferior vena cava is normal in size with <50% respiratory variability, suggesting right atrial pressure of 8 mmHg. FINDINGS  Left Ventricle: Severely reduced LV function. LVOT VTI ~9 cm. CO 3 L/min @ HR 100 bpm. CI 1.4 L/min/m2. No LV thrombus on contrast imaging. Left ventricular ejection fraction, by estimation, is <20%. The left ventricle has severely decreased function. The left ventricle demonstrates global hypokinesis. Definity contrast agent was given IV to delineate the left ventricular endocardial borders. The left ventricular internal cavity size was moderately to severely dilated. There is no left ventricular hypertrophy. Indeterminate diastolic filling due to E-A fusion. Right Ventricle: The right ventricular size is mildly enlarged. No increase in right ventricular wall thickness. Right ventricular systolic function is severely reduced. There is mildly elevated pulmonary artery systolic pressure. The tricuspid regurgitant velocity is 2.85 m/s, and with an assumed right atrial pressure of 8 mmHg,  the estimated right ventricular systolic pressure is 65.7 mmHg. Left Atrium: Left atrial size was mildly dilated. Right Atrium: Right atrial size was normal in size. Pericardium: Trivial pericardial effusion is present. Mitral Valve: Moderate to severe MR. Best seen on apical 3c view. Likely functional. The mitral valve is grossly normal. Moderate to severe mitral valve regurgitation. No evidence of mitral valve stenosis. Tricuspid Valve: The tricuspid valve is grossly normal. Tricuspid valve regurgitation is mild . No evidence of tricuspid stenosis. Aortic Valve:  The aortic valve is tricuspid. Aortic valve regurgitation is not visualized. No aortic stenosis is present. Pulmonic Valve: The pulmonic valve was grossly normal. Pulmonic valve regurgitation is mild. No evidence of pulmonic stenosis. Aorta: The aortic root and ascending aorta are structurally normal, with no evidence of dilitation. Venous: The inferior vena cava is normal in size with less than 50% respiratory variability, suggesting right atrial pressure of 8 mmHg. IAS/Shunts: The atrial septum is grossly normal.  LEFT VENTRICLE PLAX 2D LVIDd:         6.70 cm LVIDs:         5.70 cm LV PW:         0.90 cm LV IVS:        0.80 cm LVOT diam:     2.10 cm LV SV:         30 LV SV Index:   13 LVOT Area:     3.46 cm  LV Volumes (MOD) LV vol d, MOD A2C: 158.0 ml LV vol d, MOD A4C: 167.0 ml LV vol s, MOD A2C: 104.0 ml LV vol s, MOD A4C: 109.0 ml LV SV MOD A2C:     54.0 ml LV SV MOD A4C:     167.0 ml LV SV MOD BP:      56.8 ml RIGHT VENTRICLE TAPSE (M-mode): 1.4 cm LEFT ATRIUM             Index        RIGHT ATRIUM           Index LA diam:        4.30 cm 1.92 cm/m   RA Area:     16.00 cm LA Vol (A2C):   77.8 ml 34.78 ml/m  RA Volume:   38.00 ml  16.99 ml/m LA Vol (A4C):   65.5 ml 29.28 ml/m LA Biplane Vol: 71.4 ml 31.92 ml/m  AORTIC VALVE LVOT Vmax:   67.28 cm/s LVOT Vmean:  40.650 cm/s LVOT VTI:    0.086 m  AORTA Ao Root diam: 3.20 cm Ao Asc diam:  3.20 cm MR  Peak grad:    59.3 mmHg    TRICUSPID VALVE MR Mean grad:    46.5 mmHg    TR Peak grad:   32.5 mmHg MR Vmax:         385.00 cm/s  TR Vmax:        285.00 cm/s MR Vmean:        331.5 cm/s MR PISA:         0.57 cm     SHUNTS MR PISA Eff ROA: 6 mm        Systemic VTI:  0.09 m MR PISA Radius:  0.30 cm      Systemic Diam: 2.10 cm Eleonore Chiquito MD Electronically signed by Eleonore Chiquito MD Signature Date/Time: 07/29/2022/11:00:11 AM    Final      Medications:     Scheduled Medications:  Chlorhexidine Gluconate Cloth  6 each Topical Daily   digoxin  0.125 mg Oral Daily   enoxaparin (LOVENOX) injection  40 mg Subcutaneous Q24H   gabapentin  400 mg Oral TID   pravastatin  10 mg Oral Daily   sodium chloride flush  10-40 mL Intracatheter Q12H   sodium chloride flush  3 mL Intravenous Q12H   sodium chloride flush  3 mL Intravenous Q12H   spironolactone  25 mg Oral Daily   umeclidinium bromide  1 puff Inhalation Daily    Infusions:  milrinone 0.125  mcg/kg/min (07/30/22 0600)    PRN Medications: acetaminophen **OR** acetaminophen, albuterol, cyclobenzaprine, polyethylene glycol, sodium chloride flush    Patient Profile  Jose Ferguson is a 65 y/o male. [employer] with h/o COPD, OSA, HTN, HCV, colon CA with met to lung 2001 s/p chemo with colon/lung resection. Denies h/o known heart disease. Smoked for a long time but quit in 2001 when he was found to have colon/lung/prostate CA.  New acute HFrEF.   Assessment/Plan   1. Acute systolic HF - new diagnosis EF < 20% on echo with mild to moderate RV dysfunction and moderate to severe functional MR  - Etiology unclear. Suspect possible PVC-mediated - Lactate back at 3.2-->2.8 . Started on milrinone - CO-OX 55% on milrinone 0.125 mcg. Hold off on bb.  - CVP  7-8 - Continue spiro - Add jardiance 10 mg daily  - Continue digoxin - Hold off on arni -- R/L cath today.  - Renal function stable.  - If cath unrevealing plan cMRI   2. Mitral  regurgitation - functional - hopefully will improve with treatment of HF   3. COPD - stable quit smoking 2001   4. H/o colon CA s/p partial colectomy 2001 and chemo  5. H/o lung CA s/p partial lobectomy - details unclear   6. OSA - unable to tolerate CPAP   6. PVCs  ~25 PVCs per hour -May need to add AA to suppress. Consider mexilitene post-cath  Length of Stay: 1  Amy Clegg, NP  07/30/2022, 7:56 AM  Advanced Heart Failure Team Pager [phone] (M-F; 7a - 5p)  Please contact [facility] for night-coverage after hours (5p -7a ) and weekends on [email or web address]  Patient seen and examined with the above-signed Advanced Practice Provider and/or Housestaff. I personally reviewed laboratory data, imaging studies and relevant notes. I independently examined the patient and formulated the important aspects of the plan. I have edited the note to reflect any of my changes or salient points. I have personally discussed the plan with the patient and/or family.  Now on milrinone. Co-ox improved. CVP 7. Denies CP or SOB. SBP improved.   Having frequent PVCs  General: Sitting up in bed.  No resp difficulty HEENT: normal Neck: supple. no JVD. Carotids 2+ bilat; no bruits. No lymphadenopathy or thryomegaly appreciated. Cor: PMI laterally displaced. Regular rate & rhythm. No rubs, gallops or murmurs. Lungs: clear Abdomen: soft, nontender, nondistended. No hepatosplenomegaly. No bruits or masses. Good bowel sounds. Extremities: no cyanosis, clubbing, rash, edema Neuro: alert & orientedx3, cranial nerves grossly intact. moves all 4 extremities w/o difficulty. Affect pleasant  Low output improved with milrinone. SBP has stabilized. 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 For cath today. If negative will get cMRI and add mexilitene.   Continue milrinone for now.   Will need outpatient sleep study and figure out a way to treat his OSA (unable to tolerate CPAP).  Glori Bickers, MD  8:44 AM

## 2022-07-30 NOTE — H&P (View-Only) (Signed)
  Advanced Heart Failure Rounding Note  PCP-Cardiologist: None   Subjective:    Yesterday had PICC placed and started on milrinone 0.25 mcg.   Milrinone 0.125 mcg. CO-OX 55%  Denies SOB. Denies chest pain. .    Objective:   Weight Range: 102.6 kg Body mass index is 31.55 kg/m.   Vital Signs:   Temp:  [98.2 F (36.8 C)-98.7 F (37.1 C)] 98.6 F (37 C) (08/14 0750) Pulse Rate:  [75-122] 107 (08/14 0700) Resp:  [9-28] 18 (08/14 0700) BP: (65-122)/(42-101) 113/81 (08/14 0700) SpO2:  [90 %-98 %] 95 % (08/14 0700) Weight:  [102.6 kg] 102.6 kg (08/14 0600) Last BM Date : 07/28/22  Weight change: Filed Weights   07/28/22 2251 07/29/22 0120 07/30/22 0600  Weight: 104.2 kg 104.2 kg 102.6 kg    Intake/Output:   Intake/Output Summary (Last 24 hours) at 07/30/2022 0756 Last data filed at 07/30/2022 0700 Gross per 24 hour  Intake 550.13 ml  Output 2450 ml  Net -1899.87 ml    CVP 7-8   Physical Exam    General:   No resp difficulty HEENT: Normal Neck: Supple. JVP 6-7  . Carotids 2+ bilat; no bruits. No lymphadenopathy or thyromegaly appreciated. Cor: PMI nondisplaced. Regular rate & rhythm. No rubs, gallops or murmurs. Lungs: Clear Abdomen: Soft, nontender, nondistended. No hepatosplenomegaly. No bruits or masses. Good bowel sounds. Extremities: No cyanosis, clubbing, rash, edema. RUE PICC  Neuro: Alert & orientedx3, cranial nerves grossly intact. moves all 4 extremities w/o difficulty. Affect pleasant   Telemetry  SR 80-100s with frequent PVCs.   EKG    N/A   Labs    CBC Recent Labs    07/28/22 1706 07/29/22 0242  WBC 9.9 9.7  HGB 12.3* 12.2*  HCT 39.0 38.6*  MCV 91.3 90.8  PLT 225 211   Basic Metabolic Panel Recent Labs    07/28/22 2140 07/29/22 0242 07/30/22 0525  NA  --  139 141  K  --  4.3 4.1  CL  --  107 108  CO2  --  21* 25  GLUCOSE  --  141* 131*  BUN  --  16 19  CREATININE  --  1.36* 1.36*  CALCIUM  --  8.7* 8.4*  MG 2.1  --    --    Liver Function Tests Recent Labs    07/29/22 0242  AST 29  ALT 25  ALKPHOS 47  BILITOT 0.6  PROT 6.1*  ALBUMIN 3.3*   No results for input(s): "LIPASE", "AMYLASE" in the last 72 hours. Cardiac Enzymes No results for input(s): "CKTOTAL", "CKMB", "CKMBINDEX", "TROPONINI" in the last 72 hours.  BNP: BNP (last 3 results) Recent Labs    07/26/22 0716 07/28/22 1706  BNP 699.3* 1,204.3*    ProBNP (last 3 results) No results for input(s): "PROBNP" in the last 8760 hours.   D-Dimer No results for input(s): "DDIMER" in the last 72 hours. Hemoglobin A1C No results for input(s): "HGBA1C" in the last 72 hours. Fasting Lipid Panel Recent Labs    07/30/22 0525  CHOL 132  HDL 30*  LDLCALC 79  TRIG 113  CHOLHDL 4.4   Thyroid Function Tests No results for input(s): "TSH", "T4TOTAL", "T3FREE", "THYROIDAB" in the last 72 hours.  Invalid input(s): "FREET3"  Other results:   Imaging    [place] EKG SITE RITE  Result Date: 07/29/2022 If Site Rite image not attached, placement could not be confirmed due to current cardiac rhythm.  ECHOCARDIOGRAM COMPLETE  Result   Date: 07/29/2022    ECHOCARDIOGRAM REPORT   Patient Name:   Jose Ferguson Date of Exam: 07/29/2022 Medical Rec #:  [id number]   Height:       71.0 in Accession #:    [id number]  Weight:       229.8 lb Date of Birth:  07/01/1956   BSA:          2.237 m Patient Age:    66 years    BP:           126/97 mmHg Patient Gender: M           HR:           74 bpm. Exam Location:  Inpatient Procedure: 2D Echo, Cardiac Doppler, Color Doppler and Intracardiac            Opacification Agent Indications:    Dyspnea R06.00  History:        Patient has no prior history of Echocardiogram examinations.                 COPD, Signs/Symptoms:Dyspnea; Risk Factors:Hypertension.  Sonographer:    Sarah Pirrotta RDCS Referring Phys: [id number] [place] B MELVIN IMPRESSIONS  1. Severely reduced LV function. LVOT VTI ~9 cm. CO 3 L/min @ HR 100 bpm. CI 1.4  L/min/m2. No LV thrombus on contrast imaging. Left ventricular ejection fraction, by estimation, is <20%. The left ventricle has severely decreased function. The left ventricle demonstrates global hypokinesis. The left ventricular internal cavity size was moderately to severely dilated. Indeterminate diastolic filling due to E-A fusion.  2. Right ventricular systolic function is severely reduced. The right ventricular size is mildly enlarged. There is mildly elevated pulmonary artery systolic pressure. The estimated right ventricular systolic pressure is 40.5 mmHg.  3. Left atrial size was mildly dilated.  4. Moderate to severe MR. Best seen on apical 3c view. Likely functional. The mitral valve is grossly normal. Moderate to severe mitral valve regurgitation. No evidence of mitral stenosis.  5. The aortic valve is tricuspid. Aortic valve regurgitation is not visualized. No aortic stenosis is present.  6. The inferior vena cava is normal in size with <50% respiratory variability, suggesting right atrial pressure of 8 mmHg. FINDINGS  Left Ventricle: Severely reduced LV function. LVOT VTI ~9 cm. CO 3 L/min @ HR 100 bpm. CI 1.4 L/min/m2. No LV thrombus on contrast imaging. Left ventricular ejection fraction, by estimation, is <20%. The left ventricle has severely decreased function. The left ventricle demonstrates global hypokinesis. Definity contrast agent was given IV to delineate the left ventricular endocardial borders. The left ventricular internal cavity size was moderately to severely dilated. There is no left ventricular hypertrophy. Indeterminate diastolic filling due to E-A fusion. Right Ventricle: The right ventricular size is mildly enlarged. No increase in right ventricular wall thickness. Right ventricular systolic function is severely reduced. There is mildly elevated pulmonary artery systolic pressure. The tricuspid regurgitant velocity is 2.85 m/s, and with an assumed right atrial pressure of 8 mmHg,  the estimated right ventricular systolic pressure is 40.5 mmHg. Left Atrium: Left atrial size was mildly dilated. Right Atrium: Right atrial size was normal in size. Pericardium: Trivial pericardial effusion is present. Mitral Valve: Moderate to severe MR. Best seen on apical 3c view. Likely functional. The mitral valve is grossly normal. Moderate to severe mitral valve regurgitation. No evidence of mitral valve stenosis. Tricuspid Valve: The tricuspid valve is grossly normal. Tricuspid valve regurgitation is mild . No evidence of tricuspid stenosis. Aortic Valve:   The aortic valve is tricuspid. Aortic valve regurgitation is not visualized. No aortic stenosis is present. Pulmonic Valve: The pulmonic valve was grossly normal. Pulmonic valve regurgitation is mild. No evidence of pulmonic stenosis. Aorta: The aortic root and ascending aorta are structurally normal, with no evidence of dilitation. Venous: The inferior vena cava is normal in size with less than 50% respiratory variability, suggesting right atrial pressure of 8 mmHg. IAS/Shunts: The atrial septum is grossly normal.  LEFT VENTRICLE PLAX 2D LVIDd:         6.70 cm LVIDs:         5.70 cm LV PW:         0.90 cm LV IVS:        0.80 cm LVOT diam:     2.10 cm LV SV:         30 LV SV Index:   13 LVOT Area:     3.46 cm  LV Volumes (MOD) LV vol d, MOD A2C: 158.0 ml LV vol d, MOD A4C: 167.0 ml LV vol s, MOD A2C: 104.0 ml LV vol s, MOD A4C: 109.0 ml LV SV MOD A2C:     54.0 ml LV SV MOD A4C:     167.0 ml LV SV MOD BP:      56.8 ml RIGHT VENTRICLE TAPSE (M-mode): 1.4 cm LEFT ATRIUM             Index        RIGHT ATRIUM           Index LA diam:        4.30 cm 1.92 cm/m   RA Area:     16.00 cm LA Vol (A2C):   77.8 ml 34.78 ml/m  RA Volume:   38.00 ml  16.99 ml/m LA Vol (A4C):   65.5 ml 29.28 ml/m LA Biplane Vol: 71.4 ml 31.92 ml/m  AORTIC VALVE LVOT Vmax:   67.28 cm/s LVOT Vmean:  40.650 cm/s LVOT VTI:    0.086 m  AORTA Ao Root diam: 3.20 cm Ao Asc diam:  3.20 cm MR  Peak grad:    59.3 mmHg    TRICUSPID VALVE MR Mean grad:    46.5 mmHg    TR Peak grad:   32.5 mmHg MR Vmax:         385.00 cm/s  TR Vmax:        285.00 cm/s MR Vmean:        331.5 cm/s MR PISA:         0.57 cm     SHUNTS MR PISA Eff ROA: 6 mm        Systemic VTI:  0.09 m MR PISA Radius:  0.30 cm      Systemic Diam: 2.10 cm [place] O'Neal MD Electronically signed by  O'Neal MD Signature Date/Time: 07/29/2022/11:00:11 AM    Final      Medications:     Scheduled Medications:  Chlorhexidine Gluconate Cloth  6 each Topical Daily   digoxin  0.125 mg Oral Daily   enoxaparin (LOVENOX) injection  40 mg Subcutaneous Q24H   gabapentin  400 mg Oral TID   pravastatin  10 mg Oral Daily   sodium chloride flush  10-40 mL Intracatheter Q12H   sodium chloride flush  3 mL Intravenous Q12H   sodium chloride flush  3 mL Intravenous Q12H   spironolactone  25 mg Oral Daily   umeclidinium bromide  1 puff Inhalation Daily    Infusions:  milrinone 0.125   mcg/kg/min (07/30/22 0600)    PRN Medications: acetaminophen **OR** acetaminophen, albuterol, cyclobenzaprine, polyethylene glycol, sodium chloride flush    Patient Profile  [name] is a 66 y/o male. [employer] with h/o COPD, OSA, HTN, HCV, colon CA with met to lung 2001 s/p chemo with colon/lung resection. Denies h/o known heart disease. Smoked for a long time but quit in 2001 when he was found to have colon/lung/prostate CA.  New acute HFrEF.   Assessment/Plan   1. Acute systolic HF - new diagnosis EF < 20% on echo with mild to moderate RV dysfunction and moderate to severe functional MR  - Etiology unclear. Suspect possible PVC-mediated - Lactate back at 3.2-->2.8 . Started on milrinone - CO-OX 55% on milrinone 0.125 mcg. Hold off on bb.  - CVP  7-8 - Continue spiro - Add jardiance 10 mg daily  - Continue digoxin - Hold off on arni -- R/L cath today.  - Renal function stable.  - If cath unrevealing plan cMRI   2. Mitral  regurgitation - functional - hopefully will improve with treatment of HF   3. COPD - stable quit smoking 2001   4. H/o colon CA s/p partial colectomy 2001 and chemo  5. H/o lung CA s/p partial lobectomy - details unclear   6. OSA - unable to tolerate CPAP   6. PVCs  ~25 PVCs per hour -May need to add AA to suppress. Consider mexilitene post-cath  Length of Stay: 1  Amy Clegg, NP  07/30/2022, 7:56 AM  Advanced Heart Failure Team Pager [phone] (M-F; 7a - 5p)  Please contact [facility] for night-coverage after hours (5p -7a ) and weekends on [email or web address]  Patient seen and examined with the above-signed Advanced Practice Provider and/or Housestaff. I personally reviewed laboratory data, imaging studies and relevant notes. I independently examined the patient and formulated the important aspects of the plan. I have edited the note to reflect any of my changes or salient points. I have personally discussed the plan with the patient and/or family.  Now on milrinone. Co-ox improved. CVP 7. Denies CP or SOB. SBP improved.   Having frequent PVCs  General: Sitting up in bed.  No resp difficulty HEENT: normal Neck: supple. no JVD. Carotids 2+ bilat; no bruits. No lymphadenopathy or thryomegaly appreciated. Cor: PMI laterally displaced. Regular rate & rhythm. No rubs, gallops or murmurs. Lungs: clear Abdomen: soft, nontender, nondistended. No hepatosplenomegaly. No bruits or masses. Good bowel sounds. Extremities: no cyanosis, clubbing, rash, edema Neuro: alert & orientedx3, cranial nerves grossly intact. moves all 4 extremities w/o difficulty. Affect pleasant  Low output improved with milrinone. SBP has stabilized. 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 For cath today. If negative will get cMRI and add mexilitene.   Continue milrinone for now.   Will need outpatient sleep study and figure out a way to treat his OSA (unable to tolerate CPAP).  Teana Lindahl, MD  8:44 AM   

## 2022-07-30 NOTE — Discharge Instructions (Signed)

## 2022-07-30 NOTE — Progress Notes (Signed)
Progress Note   Patient: Jose Ferguson FVC:[id number] DOB: 07/23/1956 DOA: 07/28/2022     1 DOS: the patient was seen and examined on 07/30/2022   Brief hospital course: 66 y.o. male with medical history significant of hepatitis C, hypertension, breast cancer, colon cancer, prostate cancer, COPD, hyperlipidemia, OSA, obesity, anemia, gout presenting with shortness of breath.   Patient reports 3 weeks of progressively worsening shortness of breath.  Has had associated orthopnea and dyspnea on exertion as well as some palpitations.  Denies any chest pain does report a cough.   He was seen in the ED 2 days ago and was diagnosed with new onset CHF.  He left AMA prior to this diagnosis being communicated to him and did not know that there was a recommendation for admission at that time.  He represented due to continued symptoms.   He denies fevers, chills, abdominal pain, nausea, vomiting, constipation, diarrhea.   ED Course: Vital signs in the ED significant for heart rate in the 110s to 120s, blood pressure in the 638G to 665L systolic.  Lab work-up included BMP with bicarb 21, creatinine of 1.27 which is stable from 2 days ago however increased from 4 years ago, do not have other labs our system at this time.  Glucose 142.  CBC with hemoglobin stable at 12.3.  BMP still pending but 2 days ago was elevated to 700.  Troponin 31, will get repeat**.  Chest x-ray showed stable right pleural effusion and presumed basilar atelectasis.  Patient received DuoNebs and a dose of Lasix in the ED.  Cardiology was consulted     Assessment and Plan: 1 new onset acute systolic CHF-patient admitted with gradually worsening shortness of breath dyspnea on exertion weight gain and orthopnea.  He is found to be in fluid overload. His BNP is elevated Chest x-ray with right-sided pleural effusion and cardiomegaly Echo shows EF less than 20% with moderate to severe MR Cardiology following Had been continued on BID IV Lasix   On Milrinone with added jardiance -Cont digoxin -Pt for heart cath today. If cath is unrevealing, then plan for cMRI per Cardiology      2 history of essential hypertension -BP stable and controlled this AM -Cont current regimen   3 obstructive sleep apnea  -reportedly had declined cpap   4 COPD  -No wheezing noted this AM -Cont nebulizer tx as needed -Currently on minimal O2 support   5 hyperlipidemia on statin  -LDL 79 this visit Ldl high in 2011   6 chronic pain continue home meds   7 chronic hepatitis C  -Recent LFT's reviewed, normal   8 prostate cancer on Xtandi       Subjective: Seen this AM prior to heart cath. Reports feeling better today. Denies chest pain or sob  Physical Exam: Vitals:   07/30/22 1000 07/30/22 1120 07/30/22 1241 07/30/22 1345  BP: 96/72   113/80  Pulse: 98   100  Resp: 17   13  Temp:  98.4 F (36.9 C)    TempSrc:  Oral    SpO2: 97%  97% 97%  Weight:      Height:       General exam: Awake, laying in bed, in nad Respiratory system: Normal respiratory effort, no wheezing Cardiovascular system: regular rate, s1, s2 Gastrointestinal system: Soft, nondistended, positive BS Central nervous system: CN2-12 grossly intact, strength intact Extremities: Perfused, no clubbing Skin: Normal skin turgor, no notable skin lesions seen Psychiatry: Mood normal // no visual  hallucinations   Data Reviewed:  Labs reviewed: Na 141, K 4.1, Cr 1.36  Family Communication: Pt in room, family not at bedside  Disposition: Status is: Inpatient Remains inpatient appropriate because: Severity of illness  Planned Discharge Destination: Home     Author: Marylu Lund, MD 07/30/2022 3:24 PM  For on call review [email or web address].

## 2022-07-30 NOTE — Progress Notes (Signed)
Heart Failure Navigator Progress Note  Assessed for Heart & Vascular TOC clinic readiness.  Patient does not meet criteria due to Advanced Heart Failure patient.    Lanai Conlee, BSN, RN Heart Failure Nurse Navigator Secure Chat Only   

## 2022-07-30 NOTE — Interval H&P Note (Signed)
History and Physical Interval Note:  07/30/2022 12:56 PM  Jose Ferguson  has presented today for surgery, with the diagnosis of heart failure.  The various methods of treatment have been discussed with the patient and family. After consideration of risks, benefits and other options for treatment, the patient has consented to  Procedure(s): RIGHT/LEFT HEART CATH AND CORONARY ANGIOGRAPHY (N/A) and possible coronary angioplasty as a surgical intervention.  The patient's history has been reviewed, patient examined, no change in status, stable for surgery.  I have reviewed the patient's chart and labs.  Questions were answered to the patient's satisfaction.     Reesha Debes

## 2022-07-31 ENCOUNTER — Inpatient Hospital Stay (HOSPITAL_COMMUNITY): Payer: No Typology Code available for payment source

## 2022-07-31 DIAGNOSIS — D511 Vitamin B12 deficiency anemia due to selective vitamin B12 malabsorption with proteinuria: Secondary | ICD-10-CM

## 2022-07-31 DIAGNOSIS — B1710 Acute hepatitis C without hepatic coma: Secondary | ICD-10-CM | POA: Diagnosis not present

## 2022-07-31 LAB — BASIC METABOLIC PANEL
Anion gap: 6 (ref 5–15)
BUN: 15 mg/dL (ref 8–23)
CO2: 25 mmol/L (ref 22–32)
Calcium: 8.6 mg/dL — ABNORMAL LOW (ref 8.9–10.3)
Chloride: 108 mmol/L (ref 98–111)
Creatinine, Ser: 1.25 mg/dL — ABNORMAL HIGH (ref 0.61–1.24)
GFR, Estimated: >60 mL/min (ref >60)
Glucose, Bld: 130 mg/dL — ABNORMAL HIGH (ref 70–99)
Potassium: 4.1 mmol/L (ref 3.5–5.1)
Sodium: 139 mmol/L (ref 135–145)

## 2022-07-31 LAB — COOXEMETRY PANEL
Carboxyhemoglobin: 1 % (ref 0.5–1.5)
Methemoglobin: <0.7 % (ref 0.0–1.5)
O2 Saturation: 65.2 %
Total hemoglobin: 12.9 g/dL (ref 12.0–16.0)

## 2022-07-31 MED ORDER — ASPIRIN 81 MG PO TBEC
81.0000 mg | DELAYED_RELEASE_TABLET | Freq: Every day | ORAL | Status: DC
  Administered 2022-07-31 – 2022-08-05 (×6): 81 mg via ORAL
  Filled 2022-07-31 (×6): qty 1

## 2022-07-31 MED ORDER — SACUBITRIL-VALSARTAN 24-26 MG PO TABS
1.0000 | ORAL_TABLET | Freq: Two times a day (BID) | ORAL | Status: DC
  Administered 2022-07-31 – 2022-08-01 (×3): 1 via ORAL
  Filled 2022-07-31 (×4): qty 1

## 2022-07-31 MED ORDER — LORAZEPAM 1 MG PO TABS: 1.0000 mg | ORAL_TABLET | Freq: Once | ORAL | Status: DC | PRN

## 2022-07-31 MED ORDER — ATORVASTATIN CALCIUM 10 MG PO TABS
20.0000 mg | ORAL_TABLET | Freq: Every day | ORAL | Status: DC
  Administered 2022-07-31 – 2022-08-05 (×6): 20 mg via ORAL
  Filled 2022-07-31 (×6): qty 2

## 2022-07-31 NOTE — Progress Notes (Signed)
Attempted to call report to 3E.  Nurse unavailable.

## 2022-07-31 NOTE — Progress Notes (Signed)
Progress Note   Patient: Jose Ferguson WHQ:[id number] DOB: 02-28-1956 DOA: 07/28/2022     2 DOS: the patient was seen and examined on 07/31/2022   Brief hospital course: 66 y.o. male with medical history significant of hepatitis C, hypertension, breast cancer, colon cancer, prostate cancer, COPD, hyperlipidemia, OSA, obesity, anemia, gout presenting with shortness of breath.   Patient reports 3 weeks of progressively worsening shortness of breath.  Has had associated orthopnea and dyspnea on exertion as well as some palpitations.  Denies any chest pain does report a cough.   He was seen in the ED 2 days ago and was diagnosed with new onset CHF.  He left AMA prior to this diagnosis being communicated to him and did not know that there was a recommendation for admission at that time.  He represented due to continued symptoms.   He denies fevers, chills, abdominal pain, nausea, vomiting, constipation, diarrhea.   ED Course: Vital signs in the ED significant for heart rate in the 110s to 120s, blood pressure in the 659D to 357S systolic.  Lab work-up included BMP with bicarb 21, creatinine of 1.27 which is stable from 2 days ago however increased from 4 years ago, do not have other labs our system at this time.  Glucose 142.  CBC with hemoglobin stable at 12.3.  BMP still pending but 2 days ago was elevated to 700.  Troponin 31, will get repeat**.  Chest x-ray showed stable right pleural effusion and presumed basilar atelectasis.  Patient received DuoNebs and a dose of Lasix in the ED.  Cardiology was consulted     Assessment and Plan: 1 new onset acute systolic CHF-patient admitted with gradually worsening shortness of breath dyspnea on exertion weight gain and orthopnea.  He is found to be in fluid overload. His BNP is elevated Chest x-ray with right-sided pleural effusion and cardiomegaly Echo shows EF less than 20% with moderate to severe MR Improved with BID IV Lasix, now stopped On jardiance,  digoxin, spironolactone -added entresto -On mexiletine to suppress PVC's -Milrinone stopped. Pending transfer to floor -Cath with non-obstructive dz. Unable to tolerate cardiac MRI secondary to claustrophobia      2 history of essential hypertension -BP stable and controlled this AM -Cont current regimen   3 obstructive sleep apnea  -reportedly had declined cpap   4 COPD  -Cont nebulizer tx as needed -No wheezing on exam -Currently on minimal O2 support   5 hyperlipidemia on statin  -LDL 79 this visit Ldl high in 2011   6 chronic pain continue home meds   7 chronic hepatitis C  -Recent LFT's unremarkable   8 prostate cancer on Xtandi       Subjective:  Denies chest pain, palpitations, or sob. In good spirits  Physical Exam: Vitals:   07/31/22 1100 07/31/22 1118 07/31/22 1200 07/31/22 1300  BP:    113/87  Pulse:   98   Resp: (!) '21  18 17  '$ Temp:  98.7 F (37.1 C)    TempSrc:  Oral    SpO2:   96%   Weight:      Height:       General exam: Conversant, in no acute distress Respiratory system: normal chest rise, clear, no audible wheezing Cardiovascular system: regular rhythm, s1-s2 Gastrointestinal system: Nondistended, nontender, pos BS Central nervous system: No seizures, no tremors Extremities: No cyanosis, no joint deformities Skin: No rashes, no pallor Psychiatry: Affect normal // no auditory hallucinations   Data Reviewed:  Labs reviewed: Na 139, K 4.1, Cr 1.25  Family Communication: Pt in room, family not at bedside  Disposition: Status is: Inpatient Remains inpatient appropriate because: Severity of illness  Planned Discharge Destination: Home     Author: Marylu Lund, MD 07/31/2022 2:05 PM  For on call review [email or web address].

## 2022-07-31 NOTE — Progress Notes (Addendum)
Advanced Heart Failure Rounding Note  PCP-Cardiologist: None   Subjective:   RHC- min obs disease, filling pressures ok, CO 4.7 on milrinone 0.125 mcg.   Yesterday milrinone cut back to 0.125 mcg.  Mexlitene started to suppress PVC burden.   Milrinone 0.125 mcg. CO-OX 65%.   Unable to obtain CMRI due to claustrophobia. Denies SOB.    Objective:   Weight Range: 102.3 kg Body mass index is 31.45 kg/m.   Vital Signs:   Temp:  [97.9 F (36.6 C)-98.8 F (37.1 C)] 98.1 F (36.7 C) (08/15 0748) Pulse Rate:  [89-114] 106 (08/15 0600) Resp:  [11-21] 17 (08/15 0700) BP: (96-151)/(70-119) 127/93 (08/15 0700) SpO2:  [93 %-98 %] 97 % (08/15 0600) Weight:  [102.3 kg] 102.3 kg (08/15 0500) Last BM Date : 07/28/22  Weight change: Filed Weights   07/29/22 0120 07/30/22 0600 07/31/22 0500  Weight: 104.2 kg 102.6 kg 102.3 kg    Intake/Output:   Intake/Output Summary (Last 24 hours) at 07/31/2022 0830 Last data filed at 07/31/2022 0700 Gross per 24 hour  Intake 776.88 ml  Output 1910 ml  Net -1133.12 ml    CVP 7   Physical Exam   General: In bed.  No resp difficulty HEENT: normal Neck: supple. no JVD. Carotids 2+ bilat; no bruits. No lymphadenopathy or thryomegaly appreciated. Cor: PMI nondisplaced. Regular rate & rhythm. No rubs, gallops or murmurs. Lungs: clear Abdomen: soft, nontender, nondistended. No hepatosplenomegaly. No bruits or masses. Good bowel sounds. Extremities: no cyanosis, clubbing, rash, edema. RUE PICC Neuro: alert & orientedx3, cranial nerves grossly intact. moves all 4 extremities w/o difficulty. Affect pleasant   Telemetry  SR -ST with frequent PVCs. 90-100s.    EKG    N/A   Labs    CBC Recent Labs    07/28/22 1706 07/29/22 0242 07/30/22 1314 07/30/22 1317 07/30/22 1318  WBC 9.9 9.7  --   --   --   HGB 12.3* 12.2*   < > 13.3 12.9*  HCT 39.0 38.6*   < > 39.0 38.0*  MCV 91.3 90.8  --   --   --   PLT 225 211  --   --   --    < > =  values in this interval not displayed.   Basic Metabolic Panel Recent Labs    39/12/99 2140 07/29/22 0242 07/30/22 0525 07/30/22 1314 07/30/22 1318 07/31/22 0457  NA  --    < > 141   < > 142 139  K  --    < > 4.1   < > 4.0 4.1  CL  --    < > 108  --   --  108  CO2  --    < > 25  --   --  25  GLUCOSE  --    < > 131*  --   --  130*  BUN  --    < > 19  --   --  15  CREATININE  --    < > 1.36*  --   --  1.25*  CALCIUM  --    < > 8.4*  --   --  8.6*  MG 2.1  --   --   --   --   --    < > = values in this interval not displayed.   Liver Function Tests Recent Labs    07/29/22 0242  AST 29  ALT 25  ALKPHOS 47  BILITOT  0.6  PROT 6.1*  ALBUMIN 3.3*   No results for input(s): "LIPASE", "AMYLASE" in the last 72 hours. Cardiac Enzymes No results for input(s): "CKTOTAL", "CKMB", "CKMBINDEX", "TROPONINI" in the last 72 hours.  BNP: BNP (last 3 results) Recent Labs    07/26/22 0716 07/28/22 1706  BNP 699.3* 1,204.3*    ProBNP (last 3 results) No results for input(s): "PROBNP" in the last 8760 hours.   D-Dimer No results for input(s): "DDIMER" in the last 72 hours. Hemoglobin A1C No results for input(s): "HGBA1C" in the last 72 hours. Fasting Lipid Panel Recent Labs    07/30/22 0525  CHOL 132  HDL 30*  LDLCALC 79  TRIG 113  CHOLHDL 4.4   Thyroid Function Tests No results for input(s): "TSH", "T4TOTAL", "T3FREE", "THYROIDAB" in the last 72 hours.  Invalid input(s): "FREET3"  Other results:   Imaging    CARDIAC CATHETERIZATION  Result Date: 07/30/2022   Mid RCA lesion is 20% stenosed.   Dist RCA lesion is 30% stenosed with 30% stenosed side branch in RPAV.   Mid LAD lesion is 30% stenosed.   Prox Cx to Mid Cx lesion is 40% stenosed.   2nd Mrg lesion is 60% stenosed.   Dist Cx lesion is 40% stenosed.   The left ventricular ejection fraction is less than 25% by visual estimate. Findings: On milrinone 0.125 mcg/kg/min Ao = 87/60 (70) LV = 84/16 RA = 4 RV = 25/8  PA = 33/9 (23) PCW = 9 Fick cardiac output/index = 4.7/2.1 PVR = 3.0 WU SVR = 1120 FA sat = 96% PA sat = 57%, 58% Assessment: 1. Mild non-obstructive CAD 2. Severe NICM 3. Filling pressures ok 4. 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 Hold diuretics for now. Titrate GDMT as BP permits. Check cMRI. Start mexilitene to suppress PVCs. Glori Bickers, MD 1:40 PM    Medications:     Scheduled Medications:  Chlorhexidine Gluconate Cloth  6 each Topical Daily   digoxin  0.125 mg Oral Daily   empagliflozin  10 mg Oral Daily   enoxaparin (LOVENOX) injection  40 mg Subcutaneous Q24H   gabapentin  400 mg Oral TID   mexiletine  200 mg Oral Q8H   multivitamin with minerals  1 tablet Oral Daily   pravastatin  10 mg Oral Daily   sertraline  100 mg Oral Daily   sodium chloride flush  10-40 mL Intracatheter Q12H   sodium chloride flush  3 mL Intravenous Q12H   sodium chloride flush  3 mL Intravenous Q12H   sodium chloride flush  3 mL Intravenous Q12H   spironolactone  25 mg Oral Daily   umeclidinium bromide  1 puff Inhalation Daily    Infusions:  sodium chloride Stopped (07/31/22 0118)   milrinone 0.125 mcg/kg/min (07/31/22 0700)    PRN Medications: sodium chloride, acetaminophen **OR** acetaminophen, acetaminophen, albuterol, methocarbamol, ondansetron (ZOFRAN) IV, polyethylene glycol, sodium chloride flush, sodium chloride flush    Patient Profile  [name] is a 66 y/o male. [employer] with h/o COPD, OSA, HTN, HCV, colon CA with met to lung 2001 s/p chemo with colon/lung resection. Denies h/o known heart disease. Smoked for a long time but quit in 2001 when he was found to have colon/lung/prostate CA.  New acute HFrEF.   Assessment/Plan   1. Acute systolic HF - new diagnosis EF < 20% on echo with mild to moderate RV dysfunction and moderate to severe functional MR  - Etiology unclear. Suspect  possible PVC-mediated - Lactate  back at 3.2-->2.8 . Started on milrinone. Tolerating milrinone wean.  - RHC with min non obs CAD, filling pressures ok, reduced CO 4.7 on milrinone. CO-OX 65%. Stop milrinone.  - Hold bb.  - CVP  7-8. Stable.  - Continue spiro - Continue jardiance 10 mg daily  - Continue digoxin - Add  entresto 24-26 mg twice a day   - Cath min non obs CAD. Unable to obtain cMRI due to claustrophobia.  - Renal function stable.    2. Mitral regurgitation - functional - hopefully will improve with treatment of HF   3. COPD - stable quit smoking 2001   4. H/o colon CA s/p partial colectomy 2001 and chemo  5. H/o lung CA s/p partial lobectomy - details unclear   6. OSA - unable to tolerate CPAP   6. PVCs  ~2- 25 PVCs per hour - Continue mexiletine 200 mg tid  Stable for transfer to 3e.    Length of Stay: 2  Darrick Grinder, NP  07/31/2022, 8:30 AM  Advanced Heart Failure Team Pager [phone] (M-F; 7a - 5p)  Please contact [facility] for night-coverage after hours (5p -7a ) and weekends on [email or web address]  Patient seen and examined with the above-signed Advanced Practice Provider and/or Housestaff. I personally reviewed laboratory data, imaging studies and relevant notes. I independently examined the patient and formulated the important aspects of the plan. I have edited the note to reflect any of my changes or salient points. I have personally discussed the plan with the patient and/or family.  Cath yesterday with non-obstructive CAD. Remains on milrinone 0.125. Co-ox ox CVP 9-10   Now on mexilitene to suppress PVCs. BP up. Unable to tolerate cMRI due to claustrophobia  General: Sitting up in bed. No resp difficulty HEENT: normal Neck: supple. JVP to jaw  Carotids 2+ bilat; no bruits. No lymphadenopathy or thryomegaly appreciated. Cor: PMI nondisplaced. Regular rate & rhythm. No rubs, gallops or murmurs. Lungs: clear Abdomen: obese soft, nontender, nondistended. No hepatosplenomegaly. No bruits  or masses. Good bowel sounds. Extremities: no cyanosis, clubbing, rash, edema Neuro: alert & orientedx3, cranial nerves grossly intact. moves all 4 extremities w/o difficulty. Affect pleasant  Cath results reviewed with him. 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 Stop milrinone. Continue mexilitene Continue to titrate GDMT.   Glori Bickers, MD  10:14 AM

## 2022-07-31 NOTE — TOC Initial Note (Signed)
Transition of Care [facility]) - Initial/Assessment Note    Patient Details  Name: Jose Ferguson MRN: [id number] Date of Birth: 1956/06/26  Transition [facility]) CM/SW Contact:    Bethena Roys, RN Phone Number: 07/31/2022, 2:16 PM  Clinical Narrative:  Case Manager called [place] Coordinator on yesterday to make them aware of hospitalization. Patient is a member of the [facility]- Dr. Collins [place] PCP and CSW is Joseph Art [phone] ext [phone]. Patient states he uses the [facility] for all medications (free of charge). PTA patient was from home with spouse and he states he uses a cane occasionally. Milrinone has currently been stopped. Case Manager will continue to follow for transition of care needs as the patient progresses.   Expected Discharge Plan: Home/Self Care Barriers to Discharge: Continued Medical Work up   Patient Goals and CMS Choice Patient states their goals for this hospitalization and ongoing recovery are:: to return home with support of spouse.   Choice offered to / list presented to : NA  Expected Discharge Plan and Services Expected Discharge Plan: Home/Self Care In-house Referral: NA Discharge Planning Services: CM Consult   Living arrangements for the past 2 months: Single Family Home                 DME Arranged: N/A DME Agency: NA       HH Arranged: NA  Prior Living Arrangements/Services Living arrangements for the past 2 months: Single Family Home Lives with:: Spouse Patient language and need for interpreter reviewed:: Yes Do you feel safe going back to the place where you live?: Yes      Need for Family Participation in Patient Care: No (Comment) Care giver support system in place?: No (comment) Current home services: DME (Patient has a cane) Criminal Activity/Legal Involvement Pertinent to Current Situation/Hospitalization: No - Comment as needed  Activities of Daily Living Home Assistive  Devices/Equipment: Cane (specify quad or straight) ADL Screening (condition at time of admission) Patient's cognitive ability adequate to safely complete daily activities?: Yes Is the patient deaf or have difficulty hearing?: No Does the patient have difficulty seeing, even when wearing glasses/contacts?: No Does the patient have difficulty concentrating, remembering, or making decisions?: No Patient able to express need for assistance with ADLs?: Yes Does the patient have difficulty dressing or bathing?: No Independently performs ADLs?: Yes (appropriate for developmental age) Does the patient have difficulty walking or climbing stairs?: No Weakness of Legs: None Weakness of Arms/Hands: None  Permission Sought/Granted Permission sought to share information with : Family Supports, Case Manager    Emotional Assessment Appearance:: Appears stated age Attitude/Demeanor/Rapport: Engaged Affect (typically observed): Appropriate Orientation: : Oriented to Self, Oriented to Place, Oriented to  Time, Oriented to Situation Alcohol / Substance Use: Not Applicable Psych Involvement: No (comment)  Admission diagnosis:  Acute CHF ([place]) [I50.9] Patient Active Problem List   Diagnosis Date Noted   Chronic obstructive pulmonary disease (COPD) ([place]) 07/28/2022   Hyperlipidemia 07/28/2022   Obesity 07/28/2022   Sleep apnea 07/28/2022   Unspecified mood (affective) disorder ([place]) 07/28/2022   Acute CHF ([place]) 07/28/2022   Malignant neoplasm of unspecified site of left male breast ([place]) 07/28/2022   Low back pain, unspecified 07/28/2022   Gout 07/28/2022   Chronic post-traumatic stress disorder 07/28/2022   Anemia 07/28/2022   COLONIC POLYPS, HX OF 10/03/2010   HEPATITIS C 07/11/2010   Essential hypertension 07/11/2010   CHRONIC RHINITIS 07/11/2010   HEMATURIA UNSPECIFIED 07/11/2010   FREQUENCY, URINARY  07/11/2010   History of malignant neoplasm of large intestine 07/11/2010   PROSTATE  CANCER, HX OF 07/11/2010   PCP:  Clinic, [place]:   [place], [place] [address] Phone: [phone] Fax: [place], [place] - [place] [place] [address] Phone: [phone] Fax: [phone]  Readmission Risk Interventions    07/31/2022    2:14 PM  Readmission Risk Prevention Plan  Transportation Screening Complete  PCP or Specialist Appt within 3-5 Days Complete  HRI or Home Care Consult Complete  Social Work Consult for [place] Planning/Counseling Complete  Palliative Care Screening Not Applicable  Medication Review (RN Care Manager) Referral to Pharmacy

## 2022-08-01 DIAGNOSIS — I5021 Acute systolic (congestive) heart failure: Secondary | ICD-10-CM

## 2022-08-01 LAB — COOXEMETRY PANEL
Carboxyhemoglobin: 1.2 % (ref 0.5–1.5)
Carboxyhemoglobin: 0.7 % (ref 0.5–1.5)
Carboxyhemoglobin: 0.6 % (ref 0.5–1.5)
Methemoglobin: <0.7 % (ref 0.0–1.5)
Methemoglobin: <0.7 % (ref 0.0–1.5)
Methemoglobin: <0.7 % (ref 0.0–1.5)
O2 Saturation: 36.7 %
O2 Saturation: 42.2 %
O2 Saturation: 54.1 %
Total hemoglobin: 14.1 g/dL (ref 12.0–16.0)
Total hemoglobin: 14.6 g/dL (ref 12.0–16.0)
Total hemoglobin: 13.7 g/dL (ref 12.0–16.0)

## 2022-08-01 LAB — BASIC METABOLIC PANEL
Anion gap: 7 (ref 5–15)
BUN: 15 mg/dL (ref 8–23)
CO2: 25 mmol/L (ref 22–32)
Calcium: 9 mg/dL (ref 8.9–10.3)
Chloride: 106 mmol/L (ref 98–111)
Creatinine, Ser: 1.4 mg/dL — ABNORMAL HIGH (ref 0.61–1.24)
GFR, Estimated: 55 mL/min — ABNORMAL LOW (ref >60)
Glucose, Bld: 126 mg/dL — ABNORMAL HIGH (ref 70–99)
Potassium: 4.5 mmol/L (ref 3.5–5.1)
Sodium: 138 mmol/L (ref 135–145)

## 2022-08-01 LAB — HEMOGLOBIN A1C
Hgb A1c MFr Bld: 6.1 % — ABNORMAL HIGH (ref 4.8–5.6)
Mean Plasma Glucose: 128.37 mg/dL

## 2022-08-01 LAB — IRON AND TIBC
Iron: 66 ug/dL (ref 45–182)
Saturation Ratios: 16 % — ABNORMAL LOW (ref 17.9–39.5)
TIBC: 427 ug/dL (ref 250–450)
UIBC: 361 ug/dL

## 2022-08-01 LAB — RETICULOCYTES
Immature Retic Fract: 25 % — ABNORMAL HIGH (ref 2.3–15.9)
RBC.: 4.73 MIL/uL (ref 4.22–5.81)
Retic Count, Absolute: 115.4 10*3/uL (ref 19.0–186.0)
Retic Ct Pct: 2.4 % (ref 0.4–3.1)

## 2022-08-01 LAB — FOLATE: Folate: >40 ng/mL (ref >5.9)

## 2022-08-01 LAB — GLUCOSE, CAPILLARY
Glucose-Capillary: 122 mg/dL — ABNORMAL HIGH (ref 70–99)
Glucose-Capillary: 113 mg/dL — ABNORMAL HIGH (ref 70–99)

## 2022-08-01 LAB — FERRITIN: Ferritin: 105 ng/mL (ref 24–336)

## 2022-08-01 LAB — VITAMIN B12: Vitamin B-12: 467 pg/mL (ref 180–914)

## 2022-08-01 MED ORDER — MILRINONE LACTATE IN DEXTROSE 20-5 MG/100ML-% IV SOLN
0.1250 ug/kg/min | INTRAVENOUS | Status: DC
  Administered 2022-08-01 – 2022-08-03 (×3): 0.125 ug/kg/min via INTRAVENOUS
  Filled 2022-08-01 (×3): qty 100

## 2022-08-01 MED ORDER — MIDODRINE HCL 5 MG PO TABS
5.0000 mg | ORAL_TABLET | Freq: Three times a day (TID) | ORAL | Status: DC
  Administered 2022-08-01 – 2022-08-04 (×8): 5 mg via ORAL
  Filled 2022-08-01 (×8): qty 1

## 2022-08-01 NOTE — Progress Notes (Signed)
Advanced Heart Failure Rounding Note  PCP-Cardiologist: None   Subjective:    Failed milrinone wean. Co-ox back down to 30-40s. I restarted milrinone this am.   Feels ok. No CP or SOB. CVP 6-7  Objective:   Weight Range: 102.1 kg Body mass index is 31.38 kg/m.   Vital Signs:   Temp:  [97.7 F (36.5 C)-98.8 F (37.1 C)] 98.8 F (37.1 C) (08/16 1027) Pulse Rate:  [90-110] 102 (08/16 1027) Resp:  [10-20] 20 (08/16 1027) BP: (88-116)/(63-87) 112/82 (08/16 1027) SpO2:  [96 %-99 %] 98 % (08/16 1027) Weight:  [102.1 kg-102.6 kg] 102.1 kg (08/16 0341) Last BM Date : 07/31/22  Weight change: Filed Weights   07/31/22 0500 07/31/22 1647 08/01/22 0341  Weight: 102.3 kg 102.6 kg 102.1 kg    Intake/Output:   Intake/Output Summary (Last 24 hours) at 08/01/2022 1202 Last data filed at 08/01/2022 1025 Gross per 24 hour  Intake 240 ml  Output 2400 ml  Net -2160 ml      Physical Exam   General:  Well appearing. No resp difficulty HEENT: normal Neck: supple. no JVD. Carotids 2+ bilat; no bruits. No lymphadenopathy or thryomegaly appreciated. Cor: PMI nondisplaced. Regular tachy +s3 Lungs: clear Abdomen: soft, nontender, nondistended. No hepatosplenomegaly. No bruits or masses. Good bowel sounds. Extremities: no cyanosis, clubbing, rash, edema Neuro: alert & orientedx3, cranial nerves grossly intact. moves all 4 extremities w/o difficulty. Affect pleasant   Telemetry   Sinus tach + PVCs Personally reviewed   EKG    N/A   Labs    CBC Recent Labs    07/30/22 1317 07/30/22 1318  HGB 13.3 12.9*  HCT 39.0 38.0*    Basic Metabolic Panel Recent Labs    07/31/22 0457 08/01/22 0445  NA 139 138  K 4.1 4.5  CL 108 106  CO2 25 25  GLUCOSE 130* 126*  BUN 15 15  CREATININE 1.25* 1.40*  CALCIUM 8.6* 9.0    Liver Function Tests No results for input(s): "AST", "ALT", "ALKPHOS", "BILITOT", "PROT", "ALBUMIN" in the last 72 hours.  No results for input(s):  "LIPASE", "AMYLASE" in the last 72 hours. Cardiac Enzymes No results for input(s): "CKTOTAL", "CKMB", "CKMBINDEX", "TROPONINI" in the last 72 hours.  BNP: BNP (last 3 results) Recent Labs    07/26/22 0716 07/28/22 1706  BNP 699.3* 1,204.3*     ProBNP (last 3 results) No results for input(s): "PROBNP" in the last 8760 hours.   D-Dimer No results for input(s): "DDIMER" in the last 72 hours. Hemoglobin A1C No results for input(s): "HGBA1C" in the last 72 hours. Fasting Lipid Panel Recent Labs    07/30/22 0525  CHOL 132  HDL 30*  LDLCALC 79  TRIG 113  CHOLHDL 4.4    Thyroid Function Tests No results for input(s): "TSH", "T4TOTAL", "T3FREE", "THYROIDAB" in the last 72 hours.  Invalid input(s): "FREET3"  Other results:   Imaging    No results found.   Medications:     Scheduled Medications:  aspirin EC  81 mg Oral Daily   atorvastatin  20 mg Oral Daily   Chlorhexidine Gluconate Cloth  6 each Topical Daily   digoxin  0.125 mg Oral Daily   empagliflozin  10 mg Oral Daily   enoxaparin (LOVENOX) injection  40 mg Subcutaneous Q24H   gabapentin  400 mg Oral TID   mexiletine  200 mg Oral Q8H   multivitamin with minerals  1 tablet Oral Daily   sacubitril-valsartan  1 tablet  Oral BID   sertraline  100 mg Oral Daily   sodium chloride flush  10-40 mL Intracatheter Q12H   sodium chloride flush  3 mL Intravenous Q12H   sodium chloride flush  3 mL Intravenous Q12H   sodium chloride flush  3 mL Intravenous Q12H   spironolactone  25 mg Oral Daily   umeclidinium bromide  1 puff Inhalation Daily    Infusions:  sodium chloride Stopped (07/31/22 1049)   milrinone 0.125 mcg/kg/min (08/01/22 1002)    PRN Medications: sodium chloride, acetaminophen **OR** acetaminophen, albuterol, LORazepam, methocarbamol, ondansetron (ZOFRAN) IV, polyethylene glycol, sodium chloride flush, sodium chloride flush    Patient Profile  Mr. Armistead is a 66 y/o male. [employer]  with h/o COPD, OSA, HTN, HCV, colon CA with met to lung 2001 s/p chemo with colon/lung resection. Denies h/o known heart disease. Smoked for a long time but quit in 2001 when he was found to have colon/lung/prostate CA.  New acute HFrEF.   Assessment/Plan   1. Acute systolic HF - new diagnosis EF < 20% on echo with mild to moderate RV dysfunction and moderate to severe functional MR  - Etiology unclear. Suspect possible PVC-mediated - Lactate back at 3.2-->2.8 . Started on milrinone. Tolerating milrinone wean.  - RHC with min non obs CAD, filling pressures ok, reduced CO 4.7 on milrinone. CO-OX 65%. - Off milrinone co-ox down. Restart milrinone for 48 hr then reattempt wean  - Hold bb.  - CVP  5-6 - Continue spiro - Continue jardiance 10 mg daily  - Continue digoxin - Continue entresto 24-26 mg twice a day   - Cath min non obs CAD. Unable to obtain cMRI due to claustrophobia.  - Renal function stable.    2. Mitral regurgitation - functional - hopefully will improve with treatment of HF   3. COPD - stable quit smoking 2001   4. H/o colon CA s/p partial colectomy 2001 and chemo  5. H/o lung CA s/p partial lobectomy - details unclear   6. OSA - unable to tolerate CPAP   6. PVCs  - Need to follow PVC burden - Continue mexiletine 200 mg tid   Length of Stay: 3  Glori Bickers, MD  08/01/2022, 12:02 PM  Advanced Heart Failure Team Pager [phone] (M-F; 7a - 5p)  Please contact [facility] for night-coverage after hours (5p -7a ) and weekends on [email or web address]

## 2022-08-01 NOTE — Progress Notes (Addendum)
PROGRESS NOTE    Jose Ferguson  JSE:[id number] DOB: 1956-07-05 DOA: 07/28/2022 PCP: [facility]  66/M with history of COPD, OSA, HCV, colon, breast and prostate cancer all treated surgically, former smoker presented to the ED with with shortness of breath. -Diagnosed with new onset CHF -Started on IV Lasix and milrinone, cath with mild nonobstructive CAD  Subjective: -Feels better overall, breathing has proved  Assessment and Plan:  Acute systolic HF, new diagnosis Mitral regurgitation -Echo with EF <20%, moderate RV dysfunction, moderate to severe MR  -Cards following, cath yesterday with mild nonobstructive CAD  -was started on milrinone earlier this week, now off -Diuresed with IV Lasix he is 4.2 L negative, volume status has improved -Continue Entresto, Aldactone, Jardiance, digoxin -BMP in a.m., dietitian consult  Hyperglycemia -Check HbA1c  COPD - stable quit smoking 2001 no wheezing  OSA -Has been unable to tolerate CPAP, declines this  History of chronic HCV -Follow-up at the [place]  History of prostate cancer -Prior prostatectomy, maintained on Xtandi  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  History of colon cancer -Likely early stage, had partial colectomy in early 2000 and chemo, this was at the [place]   DVT prophylaxis: Lovenox Code Status: Full code Family Communication: Discussed with patient in detail, no family at bedside Disposition Plan: Home likely 1 to 2 days  Consultants:    Procedures: CARDIAC CATHETERIZATION  1. Mild non-obstructive CAD 2. Severe NICM 3. Filling pressures ok 4. 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 Hold diuretics for now. Titrate GDMT as BP permits. Check cMRI. Start mexilitene to suppress PVCs. Glori Bickers, MD 1:40 PM     Antimicrobials:    Objective: Vitals:   08/01/22 0853 08/01/22 0940 08/01/22 1004 08/01/22 1027  BP: 112/72 105/79 101/63 112/82  Pulse: (!) 104  90  (!) 102  Resp: '18 11 18 20  '$ Temp:    98.8 F (37.1 C)  TempSrc:    Oral  SpO2: 98%   98%  Weight:      Height:        Intake/Output Summary (Last 24 hours) at 08/01/2022 1152 Last data filed at 08/01/2022 1025 Gross per 24 hour  Intake 480 ml  Output 2400 ml  Net -1920 ml   Filed Weights   07/31/22 0500 07/31/22 1647 08/01/22 0341  Weight: 102.3 kg 102.6 kg 102.1 kg    Examination:  General exam: Appears calm and comfortable, AAOx3 Respiratory system: Clear to auscultation Cardiovascular system: S1 & S2 heard, RRR.  Abd: nondistended, soft and nontender.Normal bowel sounds heard. Central nervous system: Alert and oriented. No focal neurological deficits. Extremities: Trace edema  skin: No rashes Psychiatry:  Mood & affect appropriate.     Data Reviewed:   CBC: Recent Labs  Lab 07/26/22 0716 07/28/22 1706 07/29/22 0242 07/30/22 1314 07/30/22 1317 07/30/22 1318  WBC 10.7* 9.9 9.7  --   --   --   NEUTROABS 7.0  --   --   --   --   --   HGB 12.7* 12.3* 12.2* 12.9* 13.3 12.9*  HCT 40.3 39.0 38.6* 38.0* 39.0 38.0*  MCV 91.8 91.3 90.8  --   --   --   PLT 212 225 211  --   --   --    Basic Metabolic Panel: Recent Labs  Lab 07/28/22 1706 07/28/22 2140 07/29/22 0242 07/30/22 0525 07/30/22 1314 07/30/22 1317 07/30/22 1318 07/31/22 0457 08/01/22 0445  NA 139  --  139 141  143 142 142 139 138  K 4.2  --  4.3 4.1 3.8 4.1 4.0 4.1 4.5  CL 109  --  107 108  --   --   --  108 106  CO2 21*  --  21* 25  --   --   --  25 25  GLUCOSE 142*  --  141* 131*  --   --   --  130* 126*  BUN 17  --  16 19  --   --   --  15 15  CREATININE 1.27*  --  1.36* 1.36*  --   --   --  1.25* 1.40*  CALCIUM 9.0  --  8.7* 8.4*  --   --   --  8.6* 9.0  MG  --  2.1  --   --   --   --   --   --   --    GFR: Estimated Creatinine Clearance: 61.4 mL/min (A) (by C-G formula based on SCr of 1.4 mg/dL (H)). Liver Function Tests: Recent Labs  Lab 07/29/22 0242  AST 29  ALT 25  ALKPHOS 47   BILITOT 0.6  PROT 6.1*  ALBUMIN 3.3*   No results for input(s): "LIPASE", "AMYLASE" in the last 168 hours. No results for input(s): "AMMONIA" in the last 168 hours. Coagulation Profile: No results for input(s): "INR", "PROTIME" in the last 168 hours. Cardiac Enzymes: No results for input(s): "CKTOTAL", "CKMB", "CKMBINDEX", "TROPONINI" in the last 168 hours. BNP (last 3 results) No results for input(s): "PROBNP" in the last 8760 hours. HbA1C: No results for input(s): "HGBA1C" in the last 72 hours. CBG: Recent Labs  Lab 08/01/22 1044  GLUCAP 122*   Lipid Profile: Recent Labs    07/30/22 0525  CHOL 132  HDL 30*  LDLCALC 79  TRIG 113  CHOLHDL 4.4   Thyroid Function Tests: No results for input(s): "TSH", "T4TOTAL", "FREET4", "T3FREE", "THYROIDAB" in the last 72 hours. Anemia Panel: Recent Labs    08/01/22 0445  VITAMINB12 467  FOLATE >40.0  FERRITIN 105  TIBC 427  IRON 66  RETICCTPCT 2.4   Urine analysis:    Component Value Date/Time   COLORURINE YELLOW 08/30/2010 0845   APPEARANCEUR CLEAR 08/30/2010 0845   LABSPEC >=1.030 10/25/2010 1032   PHURINE 5.0 10/25/2010 1032   GLUCOSEU NEGATIVE 08/30/2010 0845   HGBUR trace-intact 10/25/2010 1032   BILIRUBINUR negative 10/25/2010 1032   KETONESUR NEGATIVE 08/30/2010 0845   PROTEINUR NEGATIVE 08/30/2010 0845   UROBILINOGEN 0.2 10/25/2010 1032   NITRITE negative 10/25/2010 1032   LEUKOCYTESUR NEGATIVE 08/30/2010 0845   Sepsis Labs: '@LABRCNTIP'$ (procalcitonin:4,lacticidven:4)  ) Recent Results (from the past 240 hour(s))  SARS Coronavirus 2 by RT PCR (hospital order, performed in [place] hospital lab) *cepheid single result test* Anterior Nasal Swab     Status: None   Collection Time: 07/26/22  9:34 AM   Specimen: Anterior Nasal Swab  Result Value Ref Range Status   SARS Coronavirus 2 by RT PCR NEGATIVE NEGATIVE Final    Comment: (NOTE) SARS-CoV-2 target nucleic acids are NOT DETECTED.  The SARS-CoV-2 RNA  is generally detectable in upper and lower respiratory specimens during the acute phase of infection. The lowest concentration of SARS-CoV-2 viral copies this assay can detect is 250 copies / mL. A negative result does not preclude SARS-CoV-2 infection and should not be used as the sole basis for treatment or other patient management decisions.  A negative result may occur with improper specimen  collection / handling, submission of specimen other than nasopharyngeal swab, presence of viral mutation(s) within the areas targeted by this assay, and inadequate number of viral copies (<250 copies / mL). A negative result must be combined with clinical observations, patient history, and epidemiological information.  Fact Sheet for Patients:   https://www.patel.info/  Fact Sheet for Healthcare Providers: https://hall.com/  This test is not yet approved or  cleared by the [place] FDA and has been authorized for detection and/or diagnosis of SARS-CoV-2 by FDA under an Emergency Use Authorization (EUA).  This EUA will remain in effect (meaning this test can be used) for the duration of the COVID-19 declaration under Section 564(b)(1) of the Act, 21 U.S.C. section 360bbb-3(b)(1), unless the authorization is terminated or revoked sooner.  Performed at [facility], [place] [address] [place], [place] [address]   MRSA Next Gen by PCR, Nasal     Status: None   Collection Time: 07/30/22  5:25 AM   Specimen: Nasal Mucosa; Nasal Swab  Result Value Ref Range Status   MRSA by PCR Next Gen NOT DETECTED NOT DETECTED Final    Comment: (NOTE) The GeneXpert MRSA Assay (FDA approved for NASAL specimens only), is one component of a comprehensive MRSA colonization surveillance program. It is not intended to diagnose MRSA infection nor to guide or monitor treatment for MRSA infections. Test performance is not FDA approved in patients less  than 82 years old. Performed at [facility], [place] [address] [place],  [address]      Radiology Studies: CARDIAC CATHETERIZATION  Result Date: 07/30/2022   Mid RCA lesion is 20% stenosed.   Dist RCA lesion is 30% stenosed with 30% stenosed side branch in RPAV.   Mid LAD lesion is 30% stenosed.   Prox Cx to Mid Cx lesion is 40% stenosed.   2nd Mrg lesion is 60% stenosed.   Dist Cx lesion is 40% stenosed.   The left ventricular ejection fraction is less than 25% by visual estimate. Findings: On milrinone 0.125 mcg/kg/min Ao = 87/60 (70) LV = 84/16 RA = 4 RV = 25/8 PA = 33/9 (23) PCW = 9 Fick cardiac output/index = 4.7/2.1 PVR = 3.0 WU SVR = 1120 FA sat = 96% PA sat = 57%, 58% Assessment: 1. Mild non-obstructive CAD 2. Severe NICM 3. Filling pressures ok 4. 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 Hold diuretics for now. Titrate GDMT as BP permits. Check cMRI. Start mexilitene to suppress PVCs. Glori Bickers, MD 1:40 PM    Scheduled Meds:  aspirin EC  81 mg Oral Daily   atorvastatin  20 mg Oral Daily   Chlorhexidine Gluconate Cloth  6 each Topical Daily   digoxin  0.125 mg Oral Daily   empagliflozin  10 mg Oral Daily   enoxaparin (LOVENOX) injection  40 mg Subcutaneous Q24H   gabapentin  400 mg Oral TID   mexiletine  200 mg Oral Q8H   multivitamin with minerals  1 tablet Oral Daily   sacubitril-valsartan  1 tablet Oral BID   sertraline  100 mg Oral Daily   sodium chloride flush  10-40 mL Intracatheter Q12H   sodium chloride flush  3 mL Intravenous Q12H   sodium chloride flush  3 mL Intravenous Q12H   sodium chloride flush  3 mL Intravenous Q12H   spironolactone  25 mg Oral Daily   umeclidinium bromide  1 puff Inhalation Daily   Continuous Infusions:  sodium chloride Stopped (  07/31/22 1049)   milrinone 0.125 mcg/kg/min (08/01/22 1002)     LOS: 3 days    Time spent: 76mn  PDomenic Polite MD [facility]   08/01/2022, 11:52 AM

## 2022-08-01 NOTE — Progress Notes (Signed)
Mobility Specialist - Progress Note   08/01/22 1337  Mobility  Activity Ambulated with assistance in hallway  Level of Assistance Independent  Assistive Device None  Distance Ambulated (ft) 550 ft  Activity Response Tolerated well  $Mobility charge 1 Mobility   Pt was received in bed and agreeable to mobility. No c/o pain throughout ambulation. Pt was returned to bed with all needs met.   Larey Seat

## 2022-08-01 NOTE — Progress Notes (Signed)
Heart failure education completed with patient.  This is his first admission with heart failure with <20% EF.  Living with Heart Failure booklet was covered with patient concentrating on importance of daily weights, green through red zones, when to call his heart failure doctor, and a low sodium diet.  Patient was very receptive and willing to cook more at home to reduce the chances of hidden sodium in restaurant prepared meals.  He is also motivated to begin walking with his wife for exercise. [id number]

## 2022-08-01 NOTE — Progress Notes (Addendum)
   Called by nursing staff for hypotension after starting milrinone.   Stop entresto.  Continue milrinone for now.   Denard Tuminello NP-C  4:47 PM   Milrinone restarted. SBP remains soft. Add midodrine 5 mg tid.   Dr Haroldine Laws at the bedside.   Vikrant Pryce NP-C  5:49 PM

## 2022-08-02 DIAGNOSIS — I5021 Acute systolic (congestive) heart failure: Secondary | ICD-10-CM | POA: Diagnosis not present

## 2022-08-02 LAB — MAGNESIUM: Magnesium: 2.3 mg/dL (ref 1.7–2.4)

## 2022-08-02 LAB — BASIC METABOLIC PANEL
Anion gap: 7 (ref 5–15)
BUN: 15 mg/dL (ref 8–23)
CO2: 25 mmol/L (ref 22–32)
Calcium: 8.9 mg/dL (ref 8.9–10.3)
Chloride: 106 mmol/L (ref 98–111)
Creatinine, Ser: 1.29 mg/dL — ABNORMAL HIGH (ref 0.61–1.24)
GFR, Estimated: >60 mL/min (ref >60)
Glucose, Bld: 113 mg/dL — ABNORMAL HIGH (ref 70–99)
Potassium: 4.9 mmol/L (ref 3.5–5.1)
Sodium: 138 mmol/L (ref 135–145)

## 2022-08-02 LAB — COOXEMETRY PANEL
Carboxyhemoglobin: 0.3 % — ABNORMAL LOW (ref 0.5–1.5)
Methemoglobin: <0.7 % (ref 0.0–1.5)
O2 Saturation: 57.3 %
Total hemoglobin: 14.1 g/dL (ref 12.0–16.0)

## 2022-08-02 MED ORDER — AMIODARONE IV BOLUS ONLY 150 MG/100ML
150.0000 mg | Freq: Once | INTRAVENOUS | Status: AC
  Administered 2022-08-02: 150 mg via INTRAVENOUS
  Filled 2022-08-02: qty 100

## 2022-08-02 MED ORDER — AMIODARONE HCL 200 MG PO TABS
200.0000 mg | ORAL_TABLET | Freq: Two times a day (BID) | ORAL | Status: DC
  Administered 2022-08-02 – 2022-08-05 (×7): 200 mg via ORAL
  Filled 2022-08-02 (×7): qty 1

## 2022-08-02 NOTE — Progress Notes (Addendum)
Nutrition Brief Note  RD consulted for heart failure diet education.   Patient previously assess by another RD and was provided heart failure diet education at that time. RD working remotely today. Attempted to reach patient via phone call to room, with no answer, to follow up and address any additional questions.   Will continue to follow up as able to address patient's nutrition questions and concerns.   Clayborne Dana, RDN, LDN Clinical Nutrition

## 2022-08-02 NOTE — Progress Notes (Addendum)
PROGRESS NOTE    Jose Ferguson  NFA:[id number] DOB: Feb 17, 1956 DOA: 07/28/2022 PCP: [facility], [facility]  66/M with history of COPD, OSA, HCV, colon, breast and prostate cancer all treated surgically, former smoker presented to the ED with with shortness of breath. -Diagnosed with new onset CHF -Started on IV Lasix and milrinone, cath with mild nonobstructive CAD  Subjective: -Continues to feel well, no complaints, back on milrinone  Assessment and Plan:  Acute systolic HF, new diagnosis Mitral regurgitation -Echo with EF <20%, moderate RV dysfunction, moderate to severe MR  -Cards following, cath yesterday with mild nonobstructive CAD  -was started on milrinone earlier this week, now off -Diuresed with IV Lasix he is 5.7 L negative, volume status has improved -Co. ox drop to 30-40s off milrinone, milrinone added back on 8/16 -Continue Entresto, Aldactone, Jardiance, digoxin -BMP in a.m., dietitian consult  Hyperglycemia -A1c 6.1 consistent with borderline diabetes, continue Jardiance  COPD - stable quit smoking 2001 no wheezing  OSA -Has been unable to tolerate CPAP, declines this  History of chronic HCV -Follow-up at the [place]  History of prostate cancer -Prior prostatectomy  H/o Breast CA, and mastectomy, denies Chemo  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  History of colon cancer -Likely early stage, had partial colectomy in early 2000 and chemo, this was at the [place]   DVT prophylaxis: Lovenox Code Status: Full code Family Communication: Discussed with patient in detail, no family at bedside Disposition Plan: Home likely 1 to 2 days  Consultants:    Procedures: CARDIAC CATHETERIZATION  1. Mild non-obstructive CAD 2. Severe NICM 3. Filling pressures ok 4. 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 Hold diuretics for now. Titrate GDMT as BP permits. Check cMRI. Start mexilitene to suppress PVCs. Glori Bickers, MD 1:40 PM      Antimicrobials:    Objective: Vitals:   08/02/22 0700 08/02/22 0732 08/02/22 1100 08/02/22 1130  BP: 125/87 (!) 119/92 111/87 102/80  Pulse: 97 100 (!) 103 85  Resp: '11 19 14 18  '$ Temp:  98.2 F (36.8 C)  98 F (36.7 C)  TempSrc:  Oral  Oral  SpO2: 98% 96% 95% 97%  Weight:      Height:        Intake/Output Summary (Last 24 hours) at 08/02/2022 1155 Last data filed at 08/02/2022 1143 Gross per 24 hour  Intake 1224.37 ml  Output 2250 ml  Net -1025.63 ml   Filed Weights   07/31/22 1647 08/01/22 0341 08/02/22 0518  Weight: 102.6 kg 102.1 kg 100.9 kg    Examination:  General exam: Appears calm and comfortable, AAOx3 Respiratory system: Clear to auscultation Cardiovascular system: S1 & S2 heard, RRR.  Abd: nondistended, soft and nontender.Normal bowel sounds heard. Central nervous system: Alert and oriented. No focal neurological deficits. Extremities: Trace edema  skin: No rashes Psychiatry:  Mood & affect appropriate.     Data Reviewed:   CBC: Recent Labs  Lab 07/28/22 1706 07/29/22 0242 07/30/22 1314 07/30/22 1317 07/30/22 1318  WBC 9.9 9.7  --   --   --   HGB 12.3* 12.2* 12.9* 13.3 12.9*  HCT 39.0 38.6* 38.0* 39.0 38.0*  MCV 91.3 90.8  --   --   --   PLT 225 211  --   --   --    Basic Metabolic Panel: Recent Labs  Lab 07/28/22 2140 07/29/22 0242 07/30/22 0525 07/30/22 1314 07/30/22 1317 07/30/22 1318 07/31/22 0457 08/01/22 0445 08/02/22 0456  NA  --  139 141   < > 142 142 139 138 138  K  --  4.3 4.1   < > 4.1 4.0 4.1 4.5 4.9  CL  --  107 108  --   --   --  108 106 106  CO2  --  21* 25  --   --   --  '25 25 25  '$ GLUCOSE  --  141* 131*  --   --   --  130* 126* 113*  BUN  --  16 19  --   --   --  '15 15 15  '$ CREATININE  --  1.36* 1.36*  --   --   --  1.25* 1.40* 1.29*  CALCIUM  --  8.7* 8.4*  --   --   --  8.6* 9.0 8.9  MG 2.1  --   --   --   --   --   --   --  2.3   < > = values in this interval not displayed.   GFR: Estimated Creatinine  Clearance: 66.3 mL/min (A) (by C-G formula based on SCr of 1.29 mg/dL (H)). Liver Function Tests: Recent Labs  Lab 07/29/22 0242  AST 29  ALT 25  ALKPHOS 47  BILITOT 0.6  PROT 6.1*  ALBUMIN 3.3*   No results for input(s): "LIPASE", "AMYLASE" in the last 168 hours. No results for input(s): "AMMONIA" in the last 168 hours. Coagulation Profile: No results for input(s): "INR", "PROTIME" in the last 168 hours. Cardiac Enzymes: No results for input(s): "CKTOTAL", "CKMB", "CKMBINDEX", "TROPONINI" in the last 168 hours. BNP (last 3 results) No results for input(s): "PROBNP" in the last 8760 hours. HbA1C: Recent Labs    08/01/22 0445  HGBA1C 6.1*   CBG: Recent Labs  Lab 08/01/22 1044 08/01/22 1620  GLUCAP 122* 113*   Lipid Profile: No results for input(s): "CHOL", "HDL", "LDLCALC", "TRIG", "CHOLHDL", "LDLDIRECT" in the last 72 hours.  Thyroid Function Tests: No results for input(s): "TSH", "T4TOTAL", "FREET4", "T3FREE", "THYROIDAB" in the last 72 hours. Anemia Panel: Recent Labs    08/01/22 0445  VITAMINB12 467  FOLATE >40.0  FERRITIN 105  TIBC 427  IRON 66  RETICCTPCT 2.4   Urine analysis:    Component Value Date/Time   COLORURINE YELLOW 08/30/2010 0845   APPEARANCEUR CLEAR 08/30/2010 0845   LABSPEC >=1.030 10/25/2010 1032   PHURINE 5.0 10/25/2010 1032   GLUCOSEU NEGATIVE 08/30/2010 0845   HGBUR trace-intact 10/25/2010 1032   BILIRUBINUR negative 10/25/2010 1032   KETONESUR NEGATIVE 08/30/2010 0845   PROTEINUR NEGATIVE 08/30/2010 0845   UROBILINOGEN 0.2 10/25/2010 1032   NITRITE negative 10/25/2010 1032   LEUKOCYTESUR NEGATIVE 08/30/2010 0845   Sepsis Labs: '@LABRCNTIP'$ (procalcitonin:4,lacticidven:4)  ) Recent Results (from the past 240 hour(s))  SARS Coronavirus 2 by RT PCR (hospital order, performed in [facility] hospital lab) *cepheid single result test* Anterior Nasal Swab     Status: None   Collection Time: 07/26/22  9:34 AM   Specimen: Anterior  Nasal Swab  Result Value Ref Range Status   SARS Coronavirus 2 by RT PCR NEGATIVE NEGATIVE Final    Comment: (NOTE) SARS-CoV-2 target nucleic acids are NOT DETECTED.  The SARS-CoV-2 RNA is generally detectable in upper and lower respiratory specimens during the acute phase of infection. The lowest concentration of SARS-CoV-2 viral copies this assay can detect is 250 copies / mL. A negative result does not preclude SARS-CoV-2 infection and should not be used as the sole basis for  treatment or other patient management decisions.  A negative result may occur with improper specimen collection / handling, submission of specimen other than nasopharyngeal swab, presence of viral mutation(s) within the areas targeted by this assay, and inadequate number of viral copies (<250 copies / mL). A negative result must be combined with clinical observations, patient history, and epidemiological information.  Fact Sheet for Patients:   https://www.patel.info/  Fact Sheet for Healthcare Providers: https://hall.com/  This test is not yet approved or  cleared by the [place] FDA and has been authorized for detection and/or diagnosis of SARS-CoV-2 by FDA under an Emergency Use Authorization (EUA).  This EUA will remain in effect (meaning this test can be used) for the duration of the COVID-19 declaration under Section 564(b)(1) of the Act, 21 U.S.C. section 360bbb-3(b)(1), unless the authorization is terminated or revoked sooner.  Performed at [facility], [place] [address], [place], [place] [address]   MRSA Next Gen by PCR, Nasal     Status: None   Collection Time: 07/30/22  5:25 AM   Specimen: Nasal Mucosa; Nasal Swab  Result Value Ref Range Status   MRSA by PCR Next Gen NOT DETECTED NOT DETECTED Final    Comment: (NOTE) The GeneXpert MRSA Assay (FDA approved for NASAL specimens only), is one component of a comprehensive MRSA  colonization surveillance program. It is not intended to diagnose MRSA infection nor to guide or monitor treatment for MRSA infections. Test performance is not FDA approved in patients less than 78 years old. Performed at [facility], [place] [address], [place], [place] [address]      Radiology Studies: No results found.   Scheduled Meds:  amiodarone  200 mg Oral BID   aspirin EC  81 mg Oral Daily   atorvastatin  20 mg Oral Daily   Chlorhexidine Gluconate Cloth  6 each Topical Daily   digoxin  0.125 mg Oral Daily   empagliflozin  10 mg Oral Daily   enoxaparin (LOVENOX) injection  40 mg Subcutaneous Q24H   gabapentin  400 mg Oral TID   midodrine  5 mg Oral TID WC   multivitamin with minerals  1 tablet Oral Daily   sertraline  100 mg Oral Daily   sodium chloride flush  10-40 mL Intracatheter Q12H   sodium chloride flush  3 mL Intravenous Q12H   sodium chloride flush  3 mL Intravenous Q12H   sodium chloride flush  3 mL Intravenous Q12H   spironolactone  25 mg Oral Daily   umeclidinium bromide  1 puff Inhalation Daily   Continuous Infusions:  sodium chloride Stopped (07/31/22 1049)   amiodarone     milrinone 0.125 mcg/kg/min (08/02/22 0558)     LOS: 4 days    Time spent: 58mn  PDomenic Polite MD [facility]   08/02/2022, 11:55 AM

## 2022-08-02 NOTE — Plan of Care (Signed)
  Problem: Cardiac: Goal: Ability to achieve and maintain adequate cardiopulmonary perfusion will improve 08/02/2022 2122 by Kaylyn Lim, RN Outcome: Progressing 08/02/2022 2122 by Kaylyn Lim, RN Outcome: Progressing   Problem: Clinical Measurements: Goal: Respiratory complications will improve 08/02/2022 2122 by Kaylyn Lim, RN Outcome: Progressing 08/02/2022 2122 by Kaylyn Lim, RN Outcome: Progressing   Problem: Clinical Measurements: Goal: Diagnostic test results will improve 08/02/2022 2122 by Kaylyn Lim, RN Outcome: Progressing 08/02/2022 2122 by Kaylyn Lim, RN Outcome: Progressing   Problem: Safety: Goal: Ability to remain free from injury will improve 08/02/2022 2122 by Kaylyn Lim, RN Outcome: Progressing 08/02/2022 2122 by Kaylyn Lim, RN Outcome: Progressing   Problem: Cardiovascular: Goal: Ability to achieve and maintain adequate cardiovascular perfusion will improve 08/02/2022 2122 by Kaylyn Lim, RN Outcome: Progressing 08/02/2022 2122 by Kaylyn Lim, RN Outcome: Progressing Goal: Vascular access site(s) Level 0-1 will be maintained 08/02/2022 2122 by Kaylyn Lim, RN Outcome: Progressing 08/02/2022 2122 by Kaylyn Lim, RN Outcome: Progressing

## 2022-08-02 NOTE — Progress Notes (Signed)
CARDIAC REHAB PHASE I     Pt is resting in bed feeling good this afternoon. He states he has walked in hall and in room several times today and tolerated well. Reviewed heart failure education completed yesterday. Pt has no questions or concerns at this time. Will continue to follow.  [id number] Vanessa Barbara, RN BSN 08/02/2022 2:05 PM

## 2022-08-02 NOTE — Progress Notes (Signed)
Mobility Specialist - Progress Note   08/02/22 0952  Mobility  Activity Ambulated with assistance in hallway  Level of Assistance Independent  Assistive Device None  Distance Ambulated (ft) 550 ft  Activity Response Tolerated well  $Mobility charge 1 Mobility   Pt was received in bed and agreeable to mobility. No c/o pain throughout ambulation. Pt was left EOB with all needs met.  Larey Seat

## 2022-08-02 NOTE — Progress Notes (Addendum)
Advanced Heart Failure Rounding Note  PCP-Cardiologist: None   Subjective:    Failed milrinone wean. Milrinone added back 08/16.   Continues on milrinone 0.125. No am labs drawn.   BP improving after addition of midodrine and stopping entresto.  CVP not set up.  Feeling well. No dyspnea, orthopnea or PND. Ambulated halls yesterday. Legs are a little weak.   Objective:   Weight Range: 100.9 kg Body mass index is 31.02 kg/m.   Vital Signs:   Temp:  [98.1 F (36.7 C)-98.8 F (37.1 C)] 98.2 F (36.8 C) (08/17 0732) Pulse Rate:  [90-104] 100 (08/17 0732) Resp:  [11-20] 19 (08/17 0732) BP: (71-125)/(35-99) 119/92 (08/17 0732) SpO2:  [94 %-98 %] 96 % (08/17 0732) Weight:  [100.9 kg] 100.9 kg (08/17 0518) Last BM Date : 07/31/22  Weight change: Filed Weights   07/31/22 1647 08/01/22 0341 08/02/22 0518  Weight: 102.6 kg 102.1 kg 100.9 kg    Intake/Output:   Intake/Output Summary (Last 24 hours) at 08/02/2022 0813 Last data filed at 08/02/2022 0600 Gross per 24 hour  Intake 1104.37 ml  Output 2150 ml  Net -1045.63 ml     Physical Exam   General:  Well appearing. No resp difficulty HEENT: normal Neck: supple. no JVD. Carotids 2+ bilat; no bruits. No lymphadenopathy or thryomegaly appreciated. Cor: PMI nondisplaced. Regular rate & rhythm. No rubs, gallops or murmurs. Lungs: clear Abdomen: soft, nontender, nondistended. No hepatosplenomegaly. No bruits or masses. Good bowel sounds. Extremities: no cyanosis, clubbing, rash, edema Neuro: alert & orientedx3, cranial nerves grossly intact. moves all 4 extremities w/o difficulty. Affect pleasant    Telemetry   Sinus rhythm/sinus tach 90s-100s, variable PVCs about 10-15/min   EKG    Ferguson/A   Labs    CBC Recent Labs    07/30/22 1317 07/30/22 1318  HGB 13.3 12.9*  HCT 39.0 82.4*   Basic Metabolic Panel Recent Labs    07/31/22 0457 08/01/22 0445  NA 139 138  K 4.1 4.5  CL 108 106  CO2 25 25   GLUCOSE 130* 126*  BUN 15 15  CREATININE 1.25* 1.40*  CALCIUM 8.6* 9.0   Liver Function Tests No results for input(s): "AST", "ALT", "ALKPHOS", "BILITOT", "PROT", "ALBUMIN" in the last 72 hours.  No results for input(s): "LIPASE", "AMYLASE" in the last 72 hours. Cardiac Enzymes No results for input(s): "CKTOTAL", "CKMB", "CKMBINDEX", "TROPONINI" in the last 72 hours.  BNP: BNP (last 3 results) Recent Labs    07/26/22 0716 07/28/22 1706  BNP 699.3* 1,204.3*    ProBNP (last 3 results) No results for input(s): "PROBNP" in the last 8760 hours.   D-Dimer No results for input(s): "DDIMER" in the last 72 hours. Hemoglobin A1C Recent Labs    08/01/22 0445  HGBA1C 6.1*   Fasting Lipid Panel No results for input(s): "CHOL", "HDL", "LDLCALC", "TRIG", "CHOLHDL", "LDLDIRECT" in the last 72 hours. Thyroid Function Tests No results for input(s): "TSH", "T4TOTAL", "T3FREE", "THYROIDAB" in the last 72 hours.  Invalid input(s): "FREET3"  Other results:   Imaging    No results found.   Medications:     Scheduled Medications:  aspirin EC  81 mg Oral Daily   atorvastatin  20 mg Oral Daily   Chlorhexidine Gluconate Cloth  6 each Topical Daily   digoxin  0.125 mg Oral Daily   empagliflozin  10 mg Oral Daily   enoxaparin (LOVENOX) injection  40 mg Subcutaneous Q24H   gabapentin  400 mg Oral TID  mexiletine  200 mg Oral Q8H   midodrine  5 mg Oral TID WC   multivitamin with minerals  1 tablet Oral Daily   sertraline  100 mg Oral Daily   sodium chloride flush  10-40 mL Intracatheter Q12H   sodium chloride flush  3 mL Intravenous Q12H   sodium chloride flush  3 mL Intravenous Q12H   sodium chloride flush  3 mL Intravenous Q12H   spironolactone  25 mg Oral Daily   umeclidinium bromide  1 puff Inhalation Daily    Infusions:  sodium chloride Stopped (07/31/22 1049)   milrinone 0.125 mcg/kg/min (08/02/22 0558)    PRN Medications: sodium chloride, acetaminophen **OR**  acetaminophen, albuterol, methocarbamol, ondansetron (ZOFRAN) IV, polyethylene glycol, sodium chloride flush, sodium chloride flush    Patient Profile  Jose Ferguson is a 66 y/o male. [employer] with h/o COPD, OSA, HTN, HCV, colon CA with met to lung 2001 s/p chemo with colon/lung resection. Denies h/o known heart disease. Smoked for a long time but quit in 2001 when he was found to have colon/lung/prostate CA.  New acute HFrEF.   Assessment/Plan   1. Acute systolic HF - new diagnosis EF < 20% on echo with mild to moderate RV dysfunction and moderate to severe functional MR  - Etiology unclear. Suspect possible PVC-mediated - Lactate back at 3.2-->2.8 . Started on milrinone.  - RHC with min non obs CAD, filling pressures ok, reduced CO 4.7 on milrinone. CO-OX 65%. - Failed milrinone wean, CO-OX down to 30s-40s. Milrinone added back at 0.125 on 08/16. Continue for 48 hr then reattempt wean. No COOX today, check now. - Set up CVP - Hold bb.  - Continue spiro - Continue jardiance 10 mg daily  - Continue digoxin - Off Entresto with hypotension - BP improved with midodrine 5 TID, attempt to wean off prior to discharge - Cath min non obs CAD. Unable to obtain cMRI due to claustrophobia.  - Needs labs today. - CR   2. Mitral regurgitation - functional - hopefully will improve with treatment of HF   3. COPD - stable quit smoking 2001   4. H/o colon CA s/p partial colectomy 2001 and chemo  5. H/o lung CA s/p partial lobectomy - details unclear   6. OSA - unable to tolerate CPAP   6. PVCs  - Need to follow PVC burden - 10-15/min on tele this am.  - Check K and Mag - Hopefully burden will improve once milrinone weaned off - Continue mexiletine 200 mg tid   Length of Stay: 4  FINCH, Jose N, PA-C  08/02/2022, 8:13 AM  Advanced Heart Failure Team Pager [phone] (M-F; 7a - 5p)  Please contact [facility] for night-coverage after hours (5p -7a ) and weekends on  [email or web address]  Patient seen and examined with the above-signed Advanced Practice Provider and/or Housestaff. I personally reviewed laboratory data, imaging studies and relevant notes. I independently examined the patient and formulated the important aspects of the plan. I have edited the note to reflect any of my changes or salient points. I have personally discussed the plan with the patient and/or family.  Feels better on milrinone. Co-ox back up to 57%. Still with high PVC burden. Denies SOB, orthopnea or PND  General:  Sitting up in bed No resp difficulty HEENT: normal Neck: supple. no JVD. Carotids 2+ bilat; no bruits. No lymphadenopathy or thryomegaly appreciated. Cor: PMI nondisplaced. Regular rate & rhythm. No rubs, gallops or murmurs. Lungs: clear Abdomen:  obese soft, nontender, nondistended. No hepatosplenomegaly. No bruits or masses. Good bowel sounds. Extremities: no cyanosis, clubbing, rash, edema Neuro: alert & orientedx3, cranial nerves grossly intact. moves all 4 extremities w/o difficulty. Affect pleasant  Improved on milrinone. Midodrine started last night for low BP on milrinone. Volume status ok. Will set up CVP to confirm.   Switch mexilitene to amio for PVC suppression. I worry he may be inotrope dependent for now.   Jose Bickers, MD  11:35 AM

## 2022-08-03 DIAGNOSIS — I5021 Acute systolic (congestive) heart failure: Secondary | ICD-10-CM | POA: Diagnosis not present

## 2022-08-03 DIAGNOSIS — B1710 Acute hepatitis C without hepatic coma: Secondary | ICD-10-CM | POA: Diagnosis not present

## 2022-08-03 DIAGNOSIS — I509 Heart failure, unspecified: Secondary | ICD-10-CM | POA: Diagnosis not present

## 2022-08-03 LAB — CBC
HCT: 40.2 % (ref 39.0–52.0)
Hemoglobin: 13.1 g/dL (ref 13.0–17.0)
MCH: 29.4 pg (ref 26.0–34.0)
MCHC: 32.6 g/dL (ref 30.0–36.0)
MCV: 90.3 fL (ref 80.0–100.0)
Platelets: 179 10*3/uL (ref 150–400)
RBC: 4.45 MIL/uL (ref 4.22–5.81)
RDW: 13.9 % (ref 11.5–15.5)
WBC: 9.8 10*3/uL (ref 4.0–10.5)
nRBC: 0 % (ref 0.0–0.2)

## 2022-08-03 LAB — COOXEMETRY PANEL
Carboxyhemoglobin: 1.2 % (ref 0.5–1.5)
Carboxyhemoglobin: 0.7 % (ref 0.5–1.5)
Methemoglobin: 0.8 % (ref 0.0–1.5)
Methemoglobin: <0.7 % (ref 0.0–1.5)
O2 Saturation: 59.7 %
O2 Saturation: 59.1 %
Total hemoglobin: 13.3 g/dL (ref 12.0–16.0)
Total hemoglobin: 13.5 g/dL (ref 12.0–16.0)

## 2022-08-03 LAB — MAGNESIUM: Magnesium: 2.2 mg/dL (ref 1.7–2.4)

## 2022-08-03 LAB — DIGOXIN LEVEL: Digoxin Level: 0.3 ng/mL — ABNORMAL LOW (ref 0.8–2.0)

## 2022-08-03 LAB — BASIC METABOLIC PANEL
Anion gap: 7 (ref 5–15)
BUN: 16 mg/dL (ref 8–23)
CO2: 24 mmol/L (ref 22–32)
Calcium: 8.8 mg/dL — ABNORMAL LOW (ref 8.9–10.3)
Chloride: 105 mmol/L (ref 98–111)
Creatinine, Ser: 1.35 mg/dL — ABNORMAL HIGH (ref 0.61–1.24)
GFR, Estimated: 57 mL/min — ABNORMAL LOW (ref >60)
Glucose, Bld: 112 mg/dL — ABNORMAL HIGH (ref 70–99)
Potassium: 4.2 mmol/L (ref 3.5–5.1)
Sodium: 136 mmol/L (ref 135–145)

## 2022-08-03 MED ORDER — POLYETHYLENE GLYCOL 3350 17 G PO PACK
17.0000 g | PACK | Freq: Every day | ORAL | Status: DC
  Administered 2022-08-03 – 2022-08-05 (×3): 17 g via ORAL
  Filled 2022-08-03 (×3): qty 1

## 2022-08-03 NOTE — Progress Notes (Signed)
Mobility Specialist - Progress Note   08/03/22 1431  Mobility  Activity Ambulated with assistance in hallway  Level of Assistance Standby assist, set-up cues, supervision of patient - no hands on  Assistive Device Other (Comment) (Iv Pole)  Distance Ambulated (ft) 550 ft  Activity Response Tolerated well  $Mobility charge 1 Mobility    Post-mobility: 115/87(96)  BP  Pt was received in room and agreeable to mobility. C/o back pain during ambulation. Pt returned EOB with all needs met.   Larey Seat

## 2022-08-03 NOTE — Progress Notes (Signed)
PROGRESS NOTE    Jose Ferguson  MVE:[id number] DOB: 04-04-1956 DOA: 07/28/2022 PCP: [facility]  66/M with history of COPD, OSA, HCV, colon, breast and prostate cancer all treated surgically, former smoker presented to the ED with with shortness of breath. -Diagnosed with new onset CHF -Started on IV Lasix and milrinone, cath with mild nonobstructive CAD -Weaned off milrinone and then restarted on 8/16   Subjective: -Feels well, denies any complaints  Assessment and Plan:  Acute systolic HF, new diagnosis Mitral regurgitation -Echo with EF <20%, moderate RV dysfunction, moderate to severe MR  -Cards following, cath yesterday with mild nonobstructive CAD  -was started on milrinone earlier this week, now off -Diuresed with IV Lasix, he is 7.1 L negative, clinically appears euvolemic -Continue Aldactone, Jardiance, digoxin,  -Per heart failure team, hopefully can attempt milrinone wean -Dietitian consulted, creatinine is stable  Hyperglycemia -A1c 6.1 consistent with borderline diabetes, continue Jardiance  COPD - stable quit smoking 2001 no wheezing  OSA -Has been unable to tolerate CPAP, declines this  History of chronic HCV -Follow-up at the [place]  History of prostate cancer -Prior prostatectomy  H/o Breast CA, and mastectomy, - denies Chemo  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  History of colon cancer -Likely early stage, had partial colectomy in early 2000 and chemo, this was at the [place]   DVT prophylaxis: Lovenox Code Status: Full code Family Communication: No family at bedside, left message for spouse disposition Plan: Home likely 1 to 2 days  Consultants:  Advanced heart failure team  Procedures: CARDIAC CATHETERIZATION  1. Mild non-obstructive CAD 2. Severe NICM 3. Filling pressures ok 4. 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 Hold diuretics for now. Titrate GDMT as BP permits. Check cMRI. Start mexilitene  to suppress PVCs. Glori Bickers, MD 1:40 PM     Antimicrobials:    Objective: Vitals:   08/02/22 2349 08/03/22 0413 08/03/22 0803 08/03/22 0830  BP: 111/80 100/80 102/87 119/82  Pulse: 89 92    Resp: '13 15 18   '$ Temp: 97.8 F (36.6 C) 97.8 F (36.6 C)    TempSrc: Oral Oral    SpO2: 99% 100% 100%   Weight:  100.8 kg    Height:        Intake/Output Summary (Last 24 hours) at 08/03/2022 1154 Last data filed at 08/03/2022 2836 Gross per 24 hour  Intake 924.66 ml  Output 2150 ml  Net -1225.34 ml   Filed Weights   08/01/22 0341 08/02/22 0518 08/03/22 0413  Weight: 102.1 kg 100.9 kg 100.8 kg    Examination:  Gen: Awake, Alert, Oriented X 3, no distress HEENT: no JVD Lungs: Good air movement bilaterally, CTAB CVS: S1S2/RRR Abd: soft, Non tender, non distended, BS present Extremities: No edema Skin: no new rashes on exposed skin     Data Reviewed:   CBC: Recent Labs  Lab 07/28/22 1706 07/29/22 0242 07/30/22 1314 07/30/22 1317 07/30/22 1318 08/03/22 0432  WBC 9.9 9.7  --   --   --  9.8  HGB 12.3* 12.2* 12.9* 13.3 12.9* 13.1  HCT 39.0 38.6* 38.0* 39.0 38.0* 40.2  MCV 91.3 90.8  --   --   --  90.3  PLT 225 211  --   --   --  629   Basic Metabolic Panel: Recent Labs  Lab 07/28/22 2140 07/29/22 0242 07/30/22 0525 07/30/22 1314 07/30/22 1318 07/31/22 0457 08/01/22 0445 08/02/22 0456 08/03/22 0432  NA  --    < >  141   < > 142 139 138 138 136  K  --    < > 4.1   < > 4.0 4.1 4.5 4.9 4.2  CL  --    < > 108  --   --  108 106 106 105  CO2  --    < > 25  --   --  '25 25 25 24  '$ GLUCOSE  --    < > 131*  --   --  130* 126* 113* 112*  BUN  --    < > 19  --   --  '15 15 15 16  '$ CREATININE  --    < > 1.36*  --   --  1.25* 1.40* 1.29* 1.35*  CALCIUM  --    < > 8.4*  --   --  8.6* 9.0 8.9 8.8*  MG 2.1  --   --   --   --   --   --  2.3 2.2   < > = values in this interval not displayed.   GFR: Estimated Creatinine Clearance: 63.3 mL/min (A) (by C-G formula based on  SCr of 1.35 mg/dL (H)). Liver Function Tests: Recent Labs  Lab 07/29/22 0242  AST 29  ALT 25  ALKPHOS 47  BILITOT 0.6  PROT 6.1*  ALBUMIN 3.3*   No results for input(s): "LIPASE", "AMYLASE" in the last 168 hours. No results for input(s): "AMMONIA" in the last 168 hours. Coagulation Profile: No results for input(s): "INR", "PROTIME" in the last 168 hours. Cardiac Enzymes: No results for input(s): "CKTOTAL", "CKMB", "CKMBINDEX", "TROPONINI" in the last 168 hours. BNP (last 3 results) No results for input(s): "PROBNP" in the last 8760 hours. HbA1C: Recent Labs    08/01/22 0445  HGBA1C 6.1*   CBG: Recent Labs  Lab 08/01/22 1044 08/01/22 1620  GLUCAP 122* 113*   Lipid Profile: No results for input(s): "CHOL", "HDL", "LDLCALC", "TRIG", "CHOLHDL", "LDLDIRECT" in the last 72 hours.  Thyroid Function Tests: No results for input(s): "TSH", "T4TOTAL", "FREET4", "T3FREE", "THYROIDAB" in the last 72 hours. Anemia Panel: Recent Labs    08/01/22 0445  VITAMINB12 467  FOLATE >40.0  FERRITIN 105  TIBC 427  IRON 66  RETICCTPCT 2.4   Urine analysis:    Component Value Date/Time   COLORURINE YELLOW 08/30/2010 0845   APPEARANCEUR CLEAR 08/30/2010 0845   LABSPEC >=1.030 10/25/2010 1032   PHURINE 5.0 10/25/2010 1032   GLUCOSEU NEGATIVE 08/30/2010 0845   HGBUR trace-intact 10/25/2010 1032   BILIRUBINUR negative 10/25/2010 1032   KETONESUR NEGATIVE 08/30/2010 0845   PROTEINUR NEGATIVE 08/30/2010 0845   UROBILINOGEN 0.2 10/25/2010 1032   NITRITE negative 10/25/2010 1032   LEUKOCYTESUR NEGATIVE 08/30/2010 0845   Sepsis Labs: '@LABRCNTIP'$ (procalcitonin:4,lacticidven:4)  ) Recent Results (from the past 240 hour(s))  SARS Coronavirus 2 by RT PCR (hospital order, performed in [place] hospital lab) *cepheid single result test* Anterior Nasal Swab     Status: None   Collection Time: 07/26/22  9:34 AM   Specimen: Anterior Nasal Swab  Result Value Ref Range Status   SARS  Coronavirus 2 by RT PCR NEGATIVE NEGATIVE Final    Comment: (NOTE) SARS-CoV-2 target nucleic acids are NOT DETECTED.  The SARS-CoV-2 RNA is generally detectable in upper and lower respiratory specimens during the acute phase of infection. The lowest concentration of SARS-CoV-2 viral copies this assay can detect is 250 copies / mL. A negative result does not preclude SARS-CoV-2 infection and should not be  used as the sole basis for treatment or other patient management decisions.  A negative result may occur with improper specimen collection / handling, submission of specimen other than nasopharyngeal swab, presence of viral mutation(s) within the areas targeted by this assay, and inadequate number of viral copies (<250 copies / mL). A negative result must be combined with clinical observations, patient history, and epidemiological information.  Fact Sheet for Patients:   https://www.patel.info/  Fact Sheet for Healthcare Providers: https://hall.com/  This test is not yet approved or  cleared by the [place] FDA and has been authorized for detection and/or diagnosis of SARS-CoV-2 by FDA under an Emergency Use Authorization (EUA).  This EUA will remain in effect (meaning this test can be used) for the duration of the COVID-19 declaration under Section 564(b)(1) of the Act, 21 U.S.C. section 360bbb-3(b)(1), unless the authorization is terminated or revoked sooner.  Performed at [facility], [place] [address], [place], [place] [address]   MRSA Next Gen by PCR, Nasal     Status: None   Collection Time: 07/30/22  5:25 AM   Specimen: Nasal Mucosa; Nasal Swab  Result Value Ref Range Status   MRSA by PCR Next Gen NOT DETECTED NOT DETECTED Final    Comment: (NOTE) The GeneXpert MRSA Assay (FDA approved for NASAL specimens only), is one component of a comprehensive MRSA colonization surveillance program. It is not intended  to diagnose MRSA infection nor to guide or monitor treatment for MRSA infections. Test performance is not FDA approved in patients less than 17 years old. Performed at [facility], [place] [address] [place], [place] [address]      Radiology Studies: No results found.   Scheduled Meds:  amiodarone  200 mg Oral BID   aspirin EC  81 mg Oral Daily   atorvastatin  20 mg Oral Daily   Chlorhexidine Gluconate Cloth  6 each Topical Daily   digoxin  0.125 mg Oral Daily   empagliflozin  10 mg Oral Daily   enoxaparin (LOVENOX) injection  40 mg Subcutaneous Q24H   gabapentin  400 mg Oral TID   midodrine  5 mg Oral TID WC   multivitamin with minerals  1 tablet Oral Daily   polyethylene glycol  17 g Oral Daily   sertraline  100 mg Oral Daily   sodium chloride flush  10-40 mL Intracatheter Q12H   sodium chloride flush  3 mL Intravenous Q12H   sodium chloride flush  3 mL Intravenous Q12H   sodium chloride flush  3 mL Intravenous Q12H   spironolactone  25 mg Oral Daily   umeclidinium bromide  1 puff Inhalation Daily   Continuous Infusions:  sodium chloride Stopped (07/31/22 1049)     LOS: 5 days    Time spent: 76mn  PDomenic Polite MD [facility]   08/03/2022, 11:54 AM

## 2022-08-03 NOTE — Progress Notes (Addendum)
Advanced Heart Failure Rounding Note  PCP-Cardiologist: None   Subjective:    Failed milrinone wean. Milrinone added back 08/16.  8/17 Mexiletine switch to amio given high PVC burden   Continues on milrinone 0.125. Co-ox 60% today.   2.2 L in UOP yesterday. Wt overall down 7 lb w/ diuresis and unchanged past 24 hrs. CVP 7. No dyspnea.    SCr 1.40>>1.29>>1.35 today   Continues w/ variable PVCs, 0-15/min but overall much improved.  K 4.2 Mg 2.2   BP improved after addition of midodrine and stopping entresto.  Overall feeling well. No complaints today.    Objective:   Weight Range: 100.8 kg Body mass index is 31 kg/m.   Vital Signs:   Temp:  [97.8 F (36.6 C)-98.8 F (37.1 C)] 97.8 F (36.6 C) (08/18 0413) Pulse Rate:  [89-100] 92 (08/18 0413) Resp:  [12-18] 18 (08/18 0803) BP: (100-119)/(69-87) 119/82 (08/18 0830) SpO2:  [94 %-100 %] 100 % (08/18 0803) Weight:  [100.8 kg] 100.8 kg (08/18 0413) Last BM Date : 07/31/22  Weight change: Filed Weights   08/01/22 0341 08/02/22 0518 08/03/22 0413  Weight: 102.1 kg 100.9 kg 100.8 kg    Intake/Output:   Intake/Output Summary (Last 24 hours) at 08/03/2022 1134 Last data filed at 08/03/2022 0833 Gross per 24 hour  Intake 924.66 ml  Output 2450 ml  Net -1525.34 ml     Physical Exam   CVP 7  General:  Well appearing. No respiratory difficulty HEENT: normal Neck: supple. JVD 7 cm. Carotids 2+ bilat; no bruits. No lymphadenopathy or thyromegaly appreciated. Cor: PMI nondisplaced. Regular rate & rhythm. No rubs, gallops or murmurs. Lungs: clear Abdomen: soft, nontender, nondistended. No hepatosplenomegaly. No bruits or masses. Good bowel sounds. Extremities: no cyanosis, clubbing, rash, edema Neuro: alert & oriented x 3, cranial nerves grossly intact. moves all 4 extremities w/o difficulty. Affect pleasant.   Telemetry   NSR variable PVCs about 0-15/min   EKG    N/A   Labs    CBC Recent Labs     08/03/22 0432  WBC 9.8  HGB 13.1  HCT 40.2  MCV 90.3  PLT 707   Basic Metabolic Panel Recent Labs    08/02/22 0456 08/03/22 0432  NA 138 136  K 4.9 4.2  CL 106 105  CO2 25 24  GLUCOSE 113* 112*  BUN 15 16  CREATININE 1.29* 1.35*  CALCIUM 8.9 8.8*  MG 2.3 2.2   Liver Function Tests No results for input(s): "AST", "ALT", "ALKPHOS", "BILITOT", "PROT", "ALBUMIN" in the last 72 hours.  No results for input(s): "LIPASE", "AMYLASE" in the last 72 hours. Cardiac Enzymes No results for input(s): "CKTOTAL", "CKMB", "CKMBINDEX", "TROPONINI" in the last 72 hours.  BNP: BNP (last 3 results) Recent Labs    07/26/22 0716 07/28/22 1706  BNP 699.3* 1,204.3*    ProBNP (last 3 results) No results for input(s): "PROBNP" in the last 8760 hours.   D-Dimer No results for input(s): "DDIMER" in the last 72 hours. Hemoglobin A1C Recent Labs    08/01/22 0445  HGBA1C 6.1*   Fasting Lipid Panel No results for input(s): "CHOL", "HDL", "LDLCALC", "TRIG", "CHOLHDL", "LDLDIRECT" in the last 72 hours. Thyroid Function Tests No results for input(s): "TSH", "T4TOTAL", "T3FREE", "THYROIDAB" in the last 72 hours.  Invalid input(s): "FREET3"  Other results:   Imaging    No results found.   Medications:     Scheduled Medications:  amiodarone  200 mg Oral BID  aspirin EC  81 mg Oral Daily   atorvastatin  20 mg Oral Daily   Chlorhexidine Gluconate Cloth  6 each Topical Daily   digoxin  0.125 mg Oral Daily   empagliflozin  10 mg Oral Daily   enoxaparin (LOVENOX) injection  40 mg Subcutaneous Q24H   gabapentin  400 mg Oral TID   midodrine  5 mg Oral TID WC   multivitamin with minerals  1 tablet Oral Daily   polyethylene glycol  17 g Oral Daily   sertraline  100 mg Oral Daily   sodium chloride flush  10-40 mL Intracatheter Q12H   sodium chloride flush  3 mL Intravenous Q12H   sodium chloride flush  3 mL Intravenous Q12H   sodium chloride flush  3 mL Intravenous Q12H    spironolactone  25 mg Oral Daily   umeclidinium bromide  1 puff Inhalation Daily    Infusions:  sodium chloride Stopped (07/31/22 1049)   milrinone 0.125 mcg/kg/min (08/03/22 0825)    PRN Medications: sodium chloride, acetaminophen **OR** acetaminophen, albuterol, methocarbamol, ondansetron (ZOFRAN) IV, sodium chloride flush, sodium chloride flush    Patient Profile  Jose Ferguson is a 66 y/o male. [employer] with h/o COPD, OSA, HTN, HCV, colon CA with met to lung 2001 s/p chemo with colon/lung resection. Denies h/o known heart disease. Smoked for a long time but quit in 2001 when he was found to have colon/lung/prostate CA.  New acute HFrEF.   Assessment/Plan   1. Acute systolic HF - new diagnosis EF < 20% on echo with mild to moderate RV dysfunction and moderate to severe functional MR  - Etiology unclear. Suspect possible PVC-mediated - Lactate back at 3.2-->2.8 . Started on milrinone.  - RHC with min non obs CAD, filling pressures ok, reduced CO 4.7 on milrinone. CO-OX 65%. - Failed milrinone wean, CO-OX down to 30s-40s. Milrinone added back at 0.125 on 08/16.  - Co-ox 60% today. Will stop Milrinone today. If fails wean again will need home milrinone  - Volume status good. CVP 7  - Hold bb.  - Continue spiro 25 mg daily  - Continue jardiance 10 mg daily  - Continue digoxin 0.125 - Off Entresto with hypotension - BP improved with midodrine 5 TID, attempt to wean off prior to discharge - Cath min non obs CAD. Unable to obtain cMRI due to claustrophobia.  - CR   2. Mitral regurgitation - functional - hopefully will improve with treatment of HF   3. COPD - stable quit smoking 2001   4. H/o colon CA s/p partial colectomy 2001 and chemo  5. H/o lung CA s/p partial lobectomy - details unclear   6. OSA - unable to tolerate CPAP   6. PVCs  - 0-15/min on tele this am.  - Lytes ok, K 4.2 and Mag 2.2 - Hopefully burden will improve off milrinone  - Continue  amiodarone 200 mg tid  Will monitor over the weekend off milrinone. If fails again, will need home milrinone. Will notify Carolynn Sayers of possible need for home inotropes    Length of Stay: [address] PA-C  08/03/2022, 11:34 AM  Advanced Heart Failure Team Pager [phone] (M-F; 7a - 5p)  Please contact [facility] for night-coverage after hours (5p -7a ) and weekends on [email or web address]  Patient seen and examined with the above-signed Advanced Practice Provider and/or Housestaff. I personally reviewed laboratory data, imaging studies and relevant notes. I independently examined the patient and formulated the  important aspects of the plan. I have edited the note to reflect any of my changes or salient points. I have personally discussed the plan with the patient and/or family.  Remains on milrinone 0.125. Co-ox ok. BP improved with midodrine. PVC burden improved with switch from mexilitene to [facility]. Denies CP or SOB.   General:  Sitting up in bed  No resp difficulty HEENT: normal Neck: supple. no JVD. Carotids 2+ bilat; no bruits. No lymphadenopathy or thryomegaly appreciated. Cor: PMI nondisplaced. Regular rate & rhythm. No rubs, gallops or murmurs. Lungs: clear Abdomen: soft, nontender, nondistended. No hepatosplenomegaly. No bruits or masses. Good bowel sounds. Extremities: no cyanosis, clubbing, rash, edema Neuro: alert & orientedx3, cranial nerves grossly intact. moves all 4 extremities w/o difficulty. Affect pleasant  Will stop milrinone today. Watch co-ox closely over the weekend. May need home inotropes. Continue amio for PVC suppression.   Will continue spiro even with midodrine for now as BP tolerating.   Glori Bickers, MD  12:31 PM

## 2022-08-04 DIAGNOSIS — I5021 Acute systolic (congestive) heart failure: Secondary | ICD-10-CM | POA: Diagnosis not present

## 2022-08-04 DIAGNOSIS — I509 Heart failure, unspecified: Secondary | ICD-10-CM | POA: Diagnosis not present

## 2022-08-04 DIAGNOSIS — B1710 Acute hepatitis C without hepatic coma: Secondary | ICD-10-CM | POA: Diagnosis not present

## 2022-08-04 LAB — BASIC METABOLIC PANEL
Anion gap: 7 (ref 5–15)
BUN: 15 mg/dL (ref 8–23)
CO2: 26 mmol/L (ref 22–32)
Calcium: 9.1 mg/dL (ref 8.9–10.3)
Chloride: 105 mmol/L (ref 98–111)
Creatinine, Ser: 1.26 mg/dL — ABNORMAL HIGH (ref 0.61–1.24)
GFR, Estimated: >60 mL/min (ref >60)
Glucose, Bld: 109 mg/dL — ABNORMAL HIGH (ref 70–99)
Potassium: 4.3 mmol/L (ref 3.5–5.1)
Sodium: 138 mmol/L (ref 135–145)

## 2022-08-04 LAB — COOXEMETRY PANEL
Carboxyhemoglobin: 0.8 % (ref 0.5–1.5)
Methemoglobin: <0.7 % (ref 0.0–1.5)
O2 Saturation: 59.7 %
Total hemoglobin: 13.6 g/dL (ref 12.0–16.0)

## 2022-08-04 LAB — DIGOXIN LEVEL: Digoxin Level: 0.4 ng/mL — ABNORMAL LOW (ref 0.8–2.0)

## 2022-08-04 MED ORDER — MIDODRINE HCL 5 MG PO TABS
2.5000 mg | ORAL_TABLET | Freq: Three times a day (TID) | ORAL | Status: DC
  Administered 2022-08-04 – 2022-08-05 (×2): 2.5 mg via ORAL
  Filled 2022-08-04 (×2): qty 1

## 2022-08-04 NOTE — TOC Initial Note (Addendum)
Transition of Care [facility]) - Initial/Assessment Note    Patient Details  Name: Jose Ferguson MRN: [id number] Date of Birth: 29-Dec-1955  Transition of Care [facility]) CM/SW Contact:    Erenest Rasher, RN Phone Number: [phone] 08/04/2022, 8:27 AM  Clinical Narrative:                 HF TOC CM spoke to pt at bedside. Pt states he lives in home with wife. Was independent PTA, drives to appts. Has CPAP, scale and cane at home. Discussed daily weights and low sodium heart healthy diet. Will request meds from [place] at dc. Pt states he also has Medicare.   [facility] rep, Pam following for Home IV Milrinone.   Expected Discharge Plan: Home/Self Care Barriers to Discharge: Continued Medical Work up   Patient Goals and CMS Choice Patient states their goals for this hospitalization and ongoing recovery are:: to return home with support of spouse.   Choice offered to / list presented to : NA  Expected Discharge Plan and Services Expected Discharge Plan: Home/Self Care In-house Referral: NA Discharge Planning Services: CM Consult   Living arrangements for the past 2 months: Single Family Home                 DME Arranged: N/A DME Agency: NA       HH Arranged: NA          Prior Living Arrangements/Services Living arrangements for the past 2 months: Single Family Home Lives with:: Spouse Patient language and need for interpreter reviewed:: Yes Do you feel safe going back to the place where you live?: Yes      Need for Family Participation in Patient Care: No (Comment) Care giver support system in place?: Yes (comment) Current home services: DME (Patient has a cane) Criminal Activity/Legal Involvement Pertinent to Current Situation/Hospitalization: No - Comment as needed  Activities of Daily Living Home Assistive Devices/Equipment: Cane (specify quad or straight) ADL Screening (condition at time of admission) Patient's cognitive ability adequate to safely  complete daily activities?: Yes Is the patient deaf or have difficulty hearing?: No Does the patient have difficulty seeing, even when wearing glasses/contacts?: No Does the patient have difficulty concentrating, remembering, or making decisions?: No Patient able to express need for assistance with ADLs?: Yes Does the patient have difficulty dressing or bathing?: No Independently performs ADLs?: Yes (appropriate for developmental age) Does the patient have difficulty walking or climbing stairs?: No Weakness of Legs: None Weakness of Arms/Hands: None  Permission Sought/Granted Permission sought to share information with : PCP, Case Manager, Family Supports Permission granted to share information with : Yes, Verbal Permission Granted  Share Information with NAME: Linna Caprice     Permission granted to share info w Relationship: wife  Permission granted to share info w Contact Information: [phone]  Emotional Assessment Appearance:: Appears stated age Attitude/Demeanor/Rapport: Engaged Affect (typically observed): Appropriate Orientation: : Oriented to Self, Oriented to Place, Oriented to  Time, Oriented to Situation Alcohol / Substance Use: Not Applicable Psych Involvement: No (comment)  Admission diagnosis:  Acute CHF ([place]) [I50.9] Patient Active Problem List   Diagnosis Date Noted   Chronic obstructive pulmonary disease (COPD) ([place]) 07/28/2022   Hyperlipidemia 07/28/2022   Obesity 07/28/2022   Sleep apnea 07/28/2022   Unspecified mood (affective) disorder ([place]) 07/28/2022   Acute on chronic congestive heart failure ([place]) 07/28/2022   Malignant neoplasm of unspecified site of left male breast ([place]) 07/28/2022   Low  back pain, unspecified 07/28/2022   Gout 07/28/2022   Chronic post-traumatic stress disorder 07/28/2022   Anemia 07/28/2022   COLONIC POLYPS, HX OF 10/03/2010   HEPATITIS C 07/11/2010   Essential hypertension 07/11/2010   CHRONIC RHINITIS 07/11/2010    HEMATURIA UNSPECIFIED 07/11/2010   FREQUENCY, URINARY 07/11/2010   History of malignant neoplasm of large intestine 07/11/2010   PROSTATE CANCER, HX OF 07/11/2010   PCP:  Clinic, [place]:   [place] [facility] [place], [place] [address] Phone: [phone] Fax: [place], [place] - [address] [place] [address] Phone: [phone] Fax: [phone]     Social Determinants of Health (SDOH) Interventions    Readmission Risk Interventions    07/31/2022    2:14 PM  Readmission Risk Prevention Plan  Transportation Screening Complete  PCP or Specialist Appt within 3-5 Days Complete  HRI or [place] Complete  Social Work Consult for [place] Planning/Counseling Complete  Palliative Care Screening Not Applicable  Medication Review (RN Care Manager) Referral to Pharmacy

## 2022-08-04 NOTE — Progress Notes (Addendum)
Advanced Heart Failure Rounding Note  PCP-Cardiologist: None   Subjective:    Failed milrinone wean. Milrinone added back 08/16.  8/17 Mexiletine switch to amio given high PVC burden   Milrinone stopped yesterday. Co-ox 60% CVP 7 Weight stable   SCr stable 1.26   Feels ok. No CP or SOB.   Objective:   Weight Range: 100.8 kg Body mass index is 31 kg/m.   Vital Signs:   Temp:  [98.4 F (36.9 C)-98.7 F (37.1 C)] 98.4 F (36.9 C) (08/19 0401) Pulse Rate:  [77-88] 77 (08/19 0401) Resp:  [12-16] 16 (08/19 0401) BP: (108-112)/(71-79) 108/75 (08/19 0401) SpO2:  [95 %-99 %] 96 % (08/19 0929) Last BM Date : 08/01/22  Weight change: Filed Weights   08/01/22 0341 08/02/22 0518 08/03/22 0413  Weight: 102.1 kg 100.9 kg 100.8 kg    Intake/Output:   Intake/Output Summary (Last 24 hours) at 08/04/2022 1119 Last data filed at 08/04/2022 1004 Gross per 24 hour  Intake 480 ml  Output 2250 ml  Net -1770 ml      Physical Exam   General:  Well appearing. No resp difficulty HEENT: normal Neck: supple. no JVD. Carotids 2+ bilat; no bruits. No lymphadenopathy or thryomegaly appreciated. Cor: PMI nondisplaced. Regular rate & rhythm. No rubs, gallops or murmurs. Lungs: clear Abdomen: soft, nontender, nondistended. No hepatosplenomegaly. No bruits or masses. Good bowel sounds. Extremities: no cyanosis, clubbing, rash, edema Neuro: alert & orientedx3, cranial nerves grossly intact. moves all 4 extremities w/o difficulty. Affect pleasant    Telemetry   NSR variable PVCs about 0-10/min   EKG    N/A   Labs    CBC Recent Labs    08/03/22 0432  WBC 9.8  HGB 13.1  HCT 40.2  MCV 90.3  PLT 998    Basic Metabolic Panel Recent Labs    08/02/22 0456 08/03/22 0432 08/04/22 0550  NA 138 136 138  K 4.9 4.2 4.3  CL 106 105 105  CO2 _0 GLUCOSE 113* 112* 109*  BUN _1 CREATININE 1.29* 1.35* 1.26*  CALCIUM 8.9 8.8* 9.1  MG 2.3 2.2  --     Liver  Function Tests No results for input(s): "AST", "ALT", "ALKPHOS", "BILITOT", "PROT", "ALBUMIN" in the last 72 hours.  No results for input(s): "LIPASE", "AMYLASE" in the last 72 hours. Cardiac Enzymes No results for input(s): "CKTOTAL", "CKMB", "CKMBINDEX", "TROPONINI" in the last 72 hours.  BNP: BNP (last 3 results) Recent Labs    07/26/22 0716 07/28/22 1706  BNP 699.3* 1,204.3*     ProBNP (last 3 results) No results for input(s): "PROBNP" in the last 8760 hours.   D-Dimer No results for input(s): "DDIMER" in the last 72 hours. Hemoglobin A1C No results for input(s): "HGBA1C" in the last 72 hours.  Fasting Lipid Panel No results for input(s): "CHOL", "HDL", "LDLCALC", "TRIG", "CHOLHDL", "LDLDIRECT" in the last 72 hours. Thyroid Function Tests No results for input(s): "TSH", "T4TOTAL", "T3FREE", "THYROIDAB" in the last 72 hours.  Invalid input(s): "FREET3"  Other results:   Imaging    No results found.   Medications:     Scheduled Medications:  amiodarone  200 mg Oral BID   aspirin EC  81 mg Oral Daily   atorvastatin  20 mg Oral Daily   Chlorhexidine Gluconate Cloth  6 each Topical Daily   digoxin  0.125 mg Oral Daily   empagliflozin  10 mg Oral Daily   enoxaparin (LOVENOX) injection  40 mg Subcutaneous Q24H   gabapentin  400 mg Oral TID   midodrine  5 mg Oral TID WC   multivitamin with minerals  1 tablet Oral Daily   polyethylene glycol  17 g Oral Daily   sertraline  100 mg Oral Daily   sodium chloride flush  10-40 mL Intracatheter Q12H   sodium chloride flush  3 mL Intravenous Q12H   sodium chloride flush  3 mL Intravenous Q12H   sodium chloride flush  3 mL Intravenous Q12H   spironolactone  25 mg Oral Daily   umeclidinium bromide  1 puff Inhalation Daily    Infusions:  sodium chloride Stopped (07/31/22 1049)    PRN Medications: sodium chloride, acetaminophen **OR** acetaminophen, albuterol, methocarbamol, ondansetron (ZOFRAN) IV, sodium  chloride flush, sodium chloride flush    Patient Profile  Jose Ferguson is a 66 y/o male. [employer] with h/o COPD, OSA, HTN, HCV, colon CA with met to lung 2001 s/p chemo with colon/lung resection. Denies h/o known heart disease. Smoked for a long time but quit in 2001 when he was found to have colon/lung/prostate CA.  New acute HFrEF.   Assessment/Plan   1. Acute systolic HF - new diagnosis EF < 20% on echo with mild to moderate RV dysfunction and moderate to severe functional MR  - Cath this admit with minimal non obs CAD. Unable to obtain cMRI due to claustrophobia.  - Etiology unclear. Suspect possible PVC-mediated - Lactate back at 3.2-->2.8 . Started on milrinone.  - Failed milrinone wean, CO-OX down to 30s-40s. Milrinone added back at 0.125 on 08/16. - Milrinone stopped 8/18  - Co-ox 60% today. Continue to follow co-ox one more day.  If fails wean again will need home milrinone  - Volume status ok - Hold bb.  - Continue spiro 25 mg daily  - Continue jardiance 10 mg daily  - Continue digoxin 0.125 - Off Entresto with hypotension - BP improved with midodrine 5 TID. Will drop to 2.5 tid today - CR   2. Mitral regurgitation - functional - hopefully will improve with treatment of HF   3. COPD - stable quit smoking 2001   4. H/o colon CA s/p partial colectomy 2001 and chemo  5. H/o lung CA s/p partial lobectomy - details unclear   6. OSA - unable to tolerate CPAP   6. PVCs  - 0-15/min on tele this am.  - Lytes ok, K 4.2 and Mag 2.2 - Initially started on mexilitene but ineffective so switched to amio 200 bid - Hopefully burden will improve off milrinone  - Continue amiodarone 200 mg bid  Length of Stay: [place], MD  08/04/2022, 11:19 AM  Advanced Heart Failure Team Pager [phone] (M-F; 7a - 5p)  Please contact [facility] for night-coverage after hours (5p -7a ) and weekends on [email or web address]

## 2022-08-04 NOTE — Progress Notes (Signed)
Mobility Specialist: Progress Note   08/04/22 1123  Mobility  Activity Ambulated with assistance in hallway  Level of Assistance Standby assist, set-up cues, supervision of patient - no hands on  Assistive Device Other (Comment) (IV pole)  Distance Ambulated (ft) 470 ft  Activity Response Tolerated well  $Mobility charge 1 Mobility   Pre-Mobility: 94 HR During Mobility: 105 HR Post-Mobility: 96 HR  Received pt in bed having no complaints and agreeable to mobility. Pt was asymptomatic throughout ambulation and returned to room w/o fault. Left sitting EOB w/ call bell in reach and all needs met.  [facility] Enola Siebers Mobility Specialist Mobility Specialist [address]: [phone]

## 2022-08-04 NOTE — Progress Notes (Addendum)
PROGRESS NOTE    Jose Ferguson  WFU:[id number] DOB: 23-Dec-1955 DOA: 07/28/2022 PCP: [facility]  66/M with history of COPD, OSA, HCV, colon, breast and prostate cancer all treated surgically, former smoker presented to the ED with with shortness of breath. -Diagnosed with new onset CHF -Started on IV Lasix and milrinone, cath with mild nonobstructive CAD -Weaned off milrinone and then restarted on 8/16  -8/18, weaned off milrinone   Subjective: -Feels well, denies any complaints  Assessment and Plan:  Acute systolic HF, new diagnosis Mitral regurgitation -Echo with EF <20%, moderate RV dysfunction, moderate to severe MR  -Cards following, cath yesterday with mild nonobstructive CAD  -was started on milrinone earlier this week, now off -Diuresed with IV Lasix, he is 8.8 L negative, clinically appears euvolemic -Continue Aldactone, Jardiance, digoxin,  -Per heart failure team, weaned off milrinone gtt. yesterday afternoon -BP is stable,, creatinine 1.2 , continue midodrine -Home later today if okay with cardiology  Hyperglycemia -A1c 6.1 consistent with borderline diabetes, continue Jardiance  COPD - stable quit smoking 2001 no wheezing  OSA -Has been unable to tolerate CPAP, declines this  History of chronic HCV -Follow-up at the [place]  History of prostate cancer -Prior prostatectomy  H/o Breast CA, and mastectomy, - denies Chemo  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  History of colon cancer -Likely early stage, had partial colectomy in early 2000 and chemo, this was at the [place]   DVT prophylaxis: Lovenox Code Status: Full code Family Communication: No family at bedside, left message for spouse disposition Plan: Home later today or in a.m.  Consultants:  Advanced heart failure team  Procedures: CARDIAC CATHETERIZATION  1. Mild non-obstructive CAD 2. Severe NICM 3. Filling pressures ok 4. 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 Hold diuretics for now. Titrate GDMT as BP permits. Check cMRI. Start mexilitene to suppress PVCs. Glori Bickers, MD 1:40 PM     Antimicrobials:    Objective: Vitals:   08/03/22 1928 08/04/22 0011 08/04/22 0401 08/04/22 0929  BP: 109/73 111/71 108/75   Pulse: 88 82 77   Resp: '16 16 16   '$ Temp: 98.5 F (36.9 C) 98.4 F (36.9 C) 98.4 F (36.9 C)   TempSrc: Oral Oral Oral   SpO2:  99% 95% 96%  Weight:      Height:        Intake/Output Summary (Last 24 hours) at 08/04/2022 1252 Last data filed at 08/04/2022 1004 Gross per 24 hour  Intake 480 ml  Output 2250 ml  Net -1770 ml   Filed Weights   08/01/22 0341 08/02/22 0518 08/03/22 0413  Weight: 102.1 kg 100.9 kg 100.8 kg    Examination:  Gen: Pleasant male sitting up in bed, AAOx3, no distress HEENT: No JVD CVS: S1-S2, regular rhythm Lungs: Clear bilaterally Abdomen: Soft, nontender, bowel sounds present Extremities: No edema  Skin: no new rashes on exposed skin   Data Reviewed:   CBC: Recent Labs  Lab 07/28/22 1706 07/29/22 0242 07/30/22 1314 07/30/22 1317 07/30/22 1318 08/03/22 0432  WBC 9.9 9.7  --   --   --  9.8  HGB 12.3* 12.2* 12.9* 13.3 12.9* 13.1  HCT 39.0 38.6* 38.0* 39.0 38.0* 40.2  MCV 91.3 90.8  --   --   --  90.3  PLT 225 211  --   --   --  202   Basic Metabolic Panel: Recent Labs  Lab 07/28/22 2140 07/29/22 0242 07/31/22 0457 08/01/22 0445 08/02/22 0456  08/03/22 0432 08/04/22 0550  NA  --    < > 139 138 138 136 138  K  --    < > 4.1 4.5 4.9 4.2 4.3  CL  --    < > 108 106 106 105 105  CO2  --    < > '25 25 25 24 26  '$ GLUCOSE  --    < > 130* 126* 113* 112* 109*  BUN  --    < > '15 15 15 16 15  '$ CREATININE  --    < > 1.25* 1.40* 1.29* 1.35* 1.26*  CALCIUM  --    < > 8.6* 9.0 8.9 8.8* 9.1  MG 2.1  --   --   --  2.3 2.2  --    < > = values in this interval not displayed.   GFR: Estimated Creatinine Clearance: 67.9 mL/min (A) (by C-G formula based on SCr of 1.26 mg/dL  (H)). Liver Function Tests: Recent Labs  Lab 07/29/22 0242  AST 29  ALT 25  ALKPHOS 47  BILITOT 0.6  PROT 6.1*  ALBUMIN 3.3*   No results for input(s): "LIPASE", "AMYLASE" in the last 168 hours. No results for input(s): "AMMONIA" in the last 168 hours. Coagulation Profile: No results for input(s): "INR", "PROTIME" in the last 168 hours. Cardiac Enzymes: No results for input(s): "CKTOTAL", "CKMB", "CKMBINDEX", "TROPONINI" in the last 168 hours. BNP (last 3 results) No results for input(s): "PROBNP" in the last 8760 hours. HbA1C: No results for input(s): "HGBA1C" in the last 72 hours.  CBG: Recent Labs  Lab 08/01/22 1044 08/01/22 1620  GLUCAP 122* 113*   Lipid Profile: No results for input(s): "CHOL", "HDL", "LDLCALC", "TRIG", "CHOLHDL", "LDLDIRECT" in the last 72 hours.  Thyroid Function Tests: No results for input(s): "TSH", "T4TOTAL", "FREET4", "T3FREE", "THYROIDAB" in the last 72 hours. Anemia Panel: No results for input(s): "VITAMINB12", "FOLATE", "FERRITIN", "TIBC", "IRON", "RETICCTPCT" in the last 72 hours.  Urine analysis:    Component Value Date/Time   COLORURINE YELLOW 08/30/2010 0845   APPEARANCEUR CLEAR 08/30/2010 0845   LABSPEC >=1.030 10/25/2010 1032   PHURINE 5.0 10/25/2010 1032   GLUCOSEU NEGATIVE 08/30/2010 0845   HGBUR trace-intact 10/25/2010 1032   BILIRUBINUR negative 10/25/2010 1032   KETONESUR NEGATIVE 08/30/2010 0845   PROTEINUR NEGATIVE 08/30/2010 0845   UROBILINOGEN 0.2 10/25/2010 1032   NITRITE negative 10/25/2010 1032   LEUKOCYTESUR NEGATIVE 08/30/2010 0845   Sepsis Labs: '@LABRCNTIP'$ (procalcitonin:4,lacticidven:4)  ) Recent Results (from the past 240 hour(s))  SARS Coronavirus 2 by RT PCR (hospital order, performed in [place] hospital lab) *cepheid single result test* Anterior Nasal Swab     Status: None   Collection Time: 07/26/22  9:34 AM   Specimen: Anterior Nasal Swab  Result Value Ref Range Status   SARS Coronavirus 2 by  RT PCR NEGATIVE NEGATIVE Final    Comment: (NOTE) SARS-CoV-2 target nucleic acids are NOT DETECTED.  The SARS-CoV-2 RNA is generally detectable in upper and lower respiratory specimens during the acute phase of infection. The lowest concentration of SARS-CoV-2 viral copies this assay can detect is 250 copies / mL. A negative result does not preclude SARS-CoV-2 infection and should not be used as the sole basis for treatment or other patient management decisions.  A negative result may occur with improper specimen collection / handling, submission of specimen other than nasopharyngeal swab, presence of viral mutation(s) within the areas targeted by this assay, and inadequate number of viral copies (<250 copies /  mL). A negative result must be combined with clinical observations, patient history, and epidemiological information.  Fact Sheet for Patients:   https://www.patel.info/  Fact Sheet for Healthcare Providers: https://hall.com/  This test is not yet approved or  cleared by the [place] FDA and has been authorized for detection and/or diagnosis of SARS-CoV-2 by FDA under an Emergency Use Authorization (EUA).  This EUA will remain in effect (meaning this test can be used) for the duration of the COVID-19 declaration under Section 564(b)(1) of the Act, 21 U.S.C. section 360bbb-3(b)(1), unless the authorization is terminated or revoked sooner.  Performed at [facility], [place] [address], [place], [place] [address]   MRSA Next Gen by PCR, Nasal     Status: None   Collection Time: 07/30/22  5:25 AM   Specimen: Nasal Mucosa; Nasal Swab  Result Value Ref Range Status   MRSA by PCR Next Gen NOT DETECTED NOT DETECTED Final    Comment: (NOTE) The GeneXpert MRSA Assay (FDA approved for NASAL specimens only), is one component of a comprehensive MRSA colonization surveillance program. It is not intended to diagnose MRSA  infection nor to guide or monitor treatment for MRSA infections. Test performance is not FDA approved in patients less than 66 years old. Performed at [facility], [place] [address] [place], [place] [address]      Radiology Studies: No results found.   Scheduled Meds:  amiodarone  200 mg Oral BID   aspirin EC  81 mg Oral Daily   atorvastatin  20 mg Oral Daily   Chlorhexidine Gluconate Cloth  6 each Topical Daily   digoxin  0.125 mg Oral Daily   empagliflozin  10 mg Oral Daily   enoxaparin (LOVENOX) injection  40 mg Subcutaneous Q24H   gabapentin  400 mg Oral TID   midodrine  5 mg Oral TID WC   multivitamin with minerals  1 tablet Oral Daily   polyethylene glycol  17 g Oral Daily   sertraline  100 mg Oral Daily   sodium chloride flush  10-40 mL Intracatheter Q12H   sodium chloride flush  3 mL Intravenous Q12H   sodium chloride flush  3 mL Intravenous Q12H   sodium chloride flush  3 mL Intravenous Q12H   spironolactone  25 mg Oral Daily   umeclidinium bromide  1 puff Inhalation Daily   Continuous Infusions:  sodium chloride Stopped (07/31/22 1049)     LOS: 6 days    Time spent: 20mn  PDomenic Polite MD [facility]   08/04/2022, 12:52 PM

## 2022-08-05 DIAGNOSIS — I509 Heart failure, unspecified: Secondary | ICD-10-CM | POA: Diagnosis not present

## 2022-08-05 DIAGNOSIS — J449 Chronic obstructive pulmonary disease, unspecified: Secondary | ICD-10-CM | POA: Diagnosis not present

## 2022-08-05 DIAGNOSIS — I5023 Acute on chronic systolic (congestive) heart failure: Secondary | ICD-10-CM

## 2022-08-05 LAB — BASIC METABOLIC PANEL
Anion gap: 8 (ref 5–15)
BUN: 17 mg/dL (ref 8–23)
CO2: 26 mmol/L (ref 22–32)
Calcium: 9.1 mg/dL (ref 8.9–10.3)
Chloride: 101 mmol/L (ref 98–111)
Creatinine, Ser: 1.27 mg/dL — ABNORMAL HIGH (ref 0.61–1.24)
GFR, Estimated: >60 mL/min (ref >60)
Glucose, Bld: 106 mg/dL — ABNORMAL HIGH (ref 70–99)
Potassium: 4.3 mmol/L (ref 3.5–5.1)
Sodium: 135 mmol/L (ref 135–145)

## 2022-08-05 LAB — COOXEMETRY PANEL
Carboxyhemoglobin: 0.9 % (ref 0.5–1.5)
Methemoglobin: <0.7 % (ref 0.0–1.5)
O2 Saturation: 60.7 %
Total hemoglobin: 13.7 g/dL (ref 12.0–16.0)

## 2022-08-05 MED ORDER — SPIRONOLACTONE 25 MG PO TABS: 25.0000 mg | ORAL_TABLET | Freq: Every day | ORAL | 0 refills | Status: DC

## 2022-08-05 MED ORDER — ATORVASTATIN CALCIUM 20 MG PO TABS: 20.0000 mg | ORAL_TABLET | Freq: Every day | ORAL | 0 refills | Status: DC

## 2022-08-05 MED ORDER — AMIODARONE HCL 200 MG PO TABS
200.0000 mg | ORAL_TABLET | Freq: Once | ORAL | Status: DC
Start: 2022-08-05 — End: 2022-08-05
  Filled 2022-08-05: qty 1

## 2022-08-05 MED ORDER — GABAPENTIN 600 MG PO TABS: 300.0000 mg | ORAL_TABLET | Freq: Two times a day (BID) | ORAL | Status: DC

## 2022-08-05 MED ORDER — AMIODARONE HCL 200 MG PO TABS: 200.0000 mg | ORAL_TABLET | Freq: Two times a day (BID) | ORAL | 0 refills | Status: DC

## 2022-08-05 MED ORDER — ASPIRIN 81 MG PO TBEC: 81.0000 mg | DELAYED_RELEASE_TABLET | Freq: Every day | ORAL | 0 refills | Status: DC

## 2022-08-05 MED ORDER — GABAPENTIN 400 MG PO CAPS: 400.0000 mg | ORAL_CAPSULE | Freq: Three times a day (TID) | ORAL | 0 refills | Status: AC

## 2022-08-05 MED ORDER — EMPAGLIFLOZIN 10 MG PO TABS: 10.0000 mg | ORAL_TABLET | Freq: Every day | ORAL | 0 refills | Status: DC

## 2022-08-05 MED ORDER — DIGOXIN 125 MCG PO TABS: 0.1250 mg | ORAL_TABLET | Freq: Every day | ORAL | 0 refills | Status: DC

## 2022-08-05 NOTE — Progress Notes (Signed)
Discharge teaching complete Wife transporting home.

## 2022-08-05 NOTE — Discharge Summary (Signed)
Physician Discharge Summary  Jose Ferguson YCX:[id number] DOB: 1956/03/31 DOA: 07/28/2022  PCP: [facility]  Admit date: 07/28/2022 Discharge date: 08/05/2022  Time spent: 35  minutes  Recommendations for Outpatient Follow-up:  Advanced heart failure clinic in 2 weeks Oncology [facility] in 1 to 2 weeks, borderline diabetic   Discharge Diagnoses:  Principal Problem:   Acute systolic CHF  Mitral regurgitation   HEPATITIS C   Essential hypertension   History of malignant neoplasm of large intestine   PROSTATE CANCER, HX OF   Chronic obstructive pulmonary disease (COPD) ([place])   Hyperlipidemia   Obesity   Sleep apnea   Low back pain, unspecified   Anemia Borderline diabetes  Discharge Condition: Improved  Diet recommendation: Low-sodium, heart healthy, carb modified  Filed Weights   08/02/22 0518 08/03/22 0413 08/05/22 0606  Weight: 100.9 kg 100.8 kg 99 kg    History of present illness:  66/M with history of COPD, OSA, HCV, colon, breast and prostate cancer all treated surgically, former smoker presented to the ED with with shortness of breath. -Diagnosed with new onset CHF  Hospital Course:   Acute systolic HF, new diagnosis Mitral regurgitation -Echo with EF <20%, moderate RV dysfunction, moderate to severe MR  -Cardiology consulted, left heart cath noted mild nonobstructive CAD -Unable to obtain cardiac MRI, severe claustrophobia reported -Treated with milrinone gtt and IV Lasix earlier this week, now off -Started on amiodarone for high PVC burden -Diuresed well, he is 11.2 L negative -After he weaned off milrinone originally had to restart it following drop in his Co-ox, discontinued again on 8/18 and now remains stable  -Continue Aldactone, Jardiance, digoxin,  -Midodrine discontinued, further GDMT at follow-up if BP tolerates -Follow-up with heart failure team   Hyperglycemia -A1c 6.1 consistent with borderline diabetes,  continue Jardiance   COPD - stable quit smoking 2001 no wheezing   OSA -Has been unable to tolerate CPAP, declines this   History of chronic HCV -Follow-up at the [place]   History of prostate cancer -Prior prostatectomy   H/o Breast CA, and mastectomy, - denies Chemo   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   History of colon cancer -Likely early stage, had partial colectomy in early 2000 and chemo, this was at the [place]    Procedures:   Discharge Exam: Vitals:   08/05/22 0815 08/05/22 0932  BP: 129/89   Pulse: 84   Resp: 18   Temp: 98.5 F (36.9 C)   SpO2: 98% 98%   Gen: Awake, Alert, Oriented X 3, no distress HEENT: no JVD Lungs: Good air movement bilaterally, CTAB CVS: S1S2/RRR Abd: soft, Non tender, non distended, BS present Extremities: No edema Skin: no new rashes on exposed skin      Discharge Instructions    Allergies as of 08/05/2022   No Known Allergies      Medication List     STOP taking these medications    benzonatate 100 MG capsule Commonly known as: TESSALON   doxycycline 100 MG capsule Commonly known as: VIBRAMYCIN   lisinopril 40 MG tablet Commonly known as: ZESTRIL   losartan 50 MG tablet Commonly known as: COZAAR   NAPROSYN PO       TAKE these medications    amiodarone 200 MG tablet Commonly known as: PACERONE Take 1 tablet (200 mg total) by mouth 2 (two) times daily.   aspirin EC 81 MG tablet Take 1 tablet (81 mg total) by mouth daily. Swallow whole.  Start taking on: August 06, 2022   atorvastatin 20 MG tablet Commonly known as: LIPITOR Take 1 tablet (20 mg total) by mouth daily. Start taking on: August 06, 2022   digoxin 0.125 MG tablet Commonly known as: LANOXIN Take 1 tablet (0.125 mg total) by mouth daily. Start taking on: August 06, 2022   docusate sodium 100 MG capsule Commonly known as: COLACE Take 100 mg by mouth daily as needed for constipation.   empagliflozin 10 MG Tabs tablet Commonly known as:  JARDIANCE Take 1 tablet (10 mg total) by mouth daily. Start taking on: August 06, 2022   fluticasone 50 MCG/ACT nasal spray Commonly known as: FLONASE 1 spray by Nasal route 2 (two) times daily.   gabapentin 600 MG tablet Commonly known as: NEURONTIN Take 0.5 tablets (300 mg total) by mouth 2 (two) times daily. What changed:  how much to take when to take this   HYDROcodone-acetaminophen 5-325 MG tablet Commonly known as: NORCO/VICODIN Take 1 tablet by mouth every 6 (six) hours as needed for moderate pain.   leuprolide acetate (6 Month) 45 MG injection Generic drug: leuprolide (6 Month) Inject 45 mg into the skin. Every 6 months   loratadine 10 MG tablet Commonly known as: CLARITIN Take 10 mg by mouth daily.   Maox 420 MG Tabs Generic drug: Magnesium Oxide Take 400 mg by mouth daily.   methocarbamol 500 MG tablet Commonly known as: ROBAXIN Take 500 mg by mouth 2 (two) times daily.   Proventil HFA 108 (90 Base) MCG/ACT inhaler Generic drug: albuterol Inhale 2 puffs into the lungs. Every 4 to 6 hours as needed for wheezing and shortness of breath   sertraline 100 MG tablet Commonly known as: ZOLOFT Take 100 mg by mouth daily.   spironolactone 25 MG tablet Commonly known as: ALDACTONE Take 1 tablet (25 mg total) by mouth daily. Start taking on: August 06, 2022   tiotropium 18 MCG inhalation capsule Commonly known as: SPIRIVA Place 18 mcg into inhaler and inhale daily.   VISINE OP Place 1 drop into both eyes daily as needed (red eye).       No Known Allergies    The results of significant diagnostics from this hospitalization (including imaging, microbiology, ancillary and laboratory) are listed below for reference.    Significant Diagnostic Studies: CARDIAC CATHETERIZATION  Result Date: 07/30/2022   Mid RCA lesion is 20% stenosed.   Dist RCA lesion is 30% stenosed with 30% stenosed side branch in RPAV.   Mid LAD lesion is 30% stenosed.   Prox Cx to Mid  Cx lesion is 40% stenosed.   2nd Mrg lesion is 60% stenosed.   Dist Cx lesion is 40% stenosed.   The left ventricular ejection fraction is less than 25% by visual estimate. Findings: On milrinone 0.125 mcg/kg/min Ao = 87/60 (70) LV = 84/16 RA = 4 RV = 25/8 PA = 33/9 (23) PCW = 9 Fick cardiac output/index = 4.7/2.1 PVR = 3.0 WU SVR = 1120 FA sat = 96% PA sat = 57%, 58% Assessment: 1. Mild non-obstructive CAD 2. Severe NICM 3. Filling pressures ok 4. 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 Hold diuretics for now. Titrate GDMT as BP permits. Check cMRI. Start mexilitene to suppress PVCs. Glori Bickers, MD 1:40 PM  [place] EKG SITE RITE  Result Date: 07/29/2022 If Site Rite image not attached, placement could not be confirmed due to current cardiac rhythm.  ECHOCARDIOGRAM COMPLETE  Result Date: 07/29/2022  ECHOCARDIOGRAM REPORT   Patient Name:   NAZAIR FORTENBERRY Date of Exam: 07/29/2022 Medical Rec #:  [id number]   Height:       71.0 in Accession #:    [id number]  Weight:       229.8 lb Date of Birth:  [birth year]   BSA:          2.237 m Patient Age:    19 years    BP:           126/97 mmHg Patient Gender: M           HR:           74 bpm. Exam Location:  Inpatient Procedure: 2D Echo, Cardiac Doppler, Color Doppler and Intracardiac            Opacification Agent Indications:    Dyspnea R06.00  History:        Patient has no prior history of Echocardiogram examinations.                 COPD, Signs/Symptoms:Dyspnea; Risk Factors:Hypertension.  Sonographer:    Bernadene Person RDCS Referring Phys: [id number] [place]  1. Severely reduced LV function. LVOT VTI ~9 cm. CO 3 L/min @ HR 100 bpm. CI 1.4 L/min/m2. No LV thrombus on contrast imaging. Left ventricular ejection fraction, by estimation, is <20%. The left ventricle has severely decreased function. The left ventricle demonstrates global hypokinesis. The left ventricular internal cavity size was  moderately to severely dilated. Indeterminate diastolic filling due to E-A fusion.  2. Right ventricular systolic function is severely reduced. The right ventricular size is mildly enlarged. There is mildly elevated pulmonary artery systolic pressure. The estimated right ventricular systolic pressure is 37.8 mmHg.  3. Left atrial size was mildly dilated.  4. Moderate to severe MR. Best seen on apical 3c view. Likely functional. The mitral valve is grossly normal. Moderate to severe mitral valve regurgitation. No evidence of mitral stenosis.  5. The aortic valve is tricuspid. Aortic valve regurgitation is not visualized. No aortic stenosis is present.  6. The inferior vena cava is normal in size with <50% respiratory variability, suggesting right atrial pressure of 8 mmHg. FINDINGS  Left Ventricle: Severely reduced LV function. LVOT VTI ~9 cm. CO 3 L/min @ HR 100 bpm. CI 1.4 L/min/m2. No LV thrombus on contrast imaging. Left ventricular ejection fraction, by estimation, is <20%. The left ventricle has severely decreased function. The left ventricle demonstrates global hypokinesis. Definity contrast agent was given IV to delineate the left ventricular endocardial borders. The left ventricular internal cavity size was moderately to severely dilated. There is no left ventricular hypertrophy. Indeterminate diastolic filling due to E-A fusion. Right Ventricle: The right ventricular size is mildly enlarged. No increase in right ventricular wall thickness. Right ventricular systolic function is severely reduced. There is mildly elevated pulmonary artery systolic pressure. The tricuspid regurgitant velocity is 2.85 m/s, and with an assumed right atrial pressure of 8 mmHg, the estimated right ventricular systolic pressure is 58.8 mmHg. Left Atrium: Left atrial size was mildly dilated. Right Atrium: Right atrial size was normal in size. Pericardium: Trivial pericardial effusion is present. Mitral Valve: Moderate to severe MR.  Best seen on apical 3c view. Likely functional. The mitral valve is grossly normal. Moderate to severe mitral valve regurgitation. No evidence of mitral valve stenosis. Tricuspid Valve: The tricuspid valve is grossly normal. Tricuspid valve regurgitation is mild . No evidence of tricuspid stenosis. Aortic Valve: The aortic valve is tricuspid.  Aortic valve regurgitation is not visualized. No aortic stenosis is present. Pulmonic Valve: The pulmonic valve was grossly normal. Pulmonic valve regurgitation is mild. No evidence of pulmonic stenosis. Aorta: The aortic root and ascending aorta are structurally normal, with no evidence of dilitation. Venous: The inferior vena cava is normal in size with less than 50% respiratory variability, suggesting right atrial pressure of 8 mmHg. IAS/Shunts: The atrial septum is grossly normal.  LEFT VENTRICLE PLAX 2D LVIDd:         6.70 cm LVIDs:         5.70 cm LV PW:         0.90 cm LV IVS:        0.80 cm LVOT diam:     2.10 cm LV SV:         30 LV SV Index:   13 LVOT Area:     3.46 cm  LV Volumes (MOD) LV vol d, MOD A2C: 158.0 ml LV vol d, MOD A4C: 167.0 ml LV vol s, MOD A2C: 104.0 ml LV vol s, MOD A4C: 109.0 ml LV SV MOD A2C:     54.0 ml LV SV MOD A4C:     167.0 ml LV SV MOD BP:      56.8 ml RIGHT VENTRICLE TAPSE (M-mode): 1.4 cm LEFT ATRIUM             Index        RIGHT ATRIUM           Index LA diam:        4.30 cm 1.92 cm/m   RA Area:     16.00 cm LA Vol (A2C):   77.8 ml 34.78 ml/m  RA Volume:   38.00 ml  16.99 ml/m LA Vol (A4C):   65.5 ml 29.28 ml/m LA Biplane Vol: 71.4 ml 31.92 ml/m  AORTIC VALVE LVOT Vmax:   67.28 cm/s LVOT Vmean:  40.650 cm/s LVOT VTI:    0.086 m  AORTA Ao Root diam: 3.20 cm Ao Asc diam:  3.20 cm MR Peak grad:    59.3 mmHg    TRICUSPID VALVE MR Mean grad:    46.5 mmHg    TR Peak grad:   32.5 mmHg MR Vmax:         385.00 cm/s  TR Vmax:        285.00 cm/s MR Vmean:        331.5 cm/s MR PISA:         0.57 cm     SHUNTS MR PISA Eff ROA: 6 mm         Systemic VTI:  0.09 m MR PISA Radius:  0.30 cm      Systemic Diam: 2.10 cm Eleonore Chiquito MD Electronically signed by Eleonore Chiquito MD Signature Date/Time: 07/29/2022/11:00:11 AM    Final    DG Chest 2 View  Result Date: 07/28/2022 CLINICAL DATA:  Chest pain and shortness of breath EXAM: CHEST - 2 VIEW COMPARISON:  Radiographs 07/26/2022 FINDINGS: No change from 07/26/2022. Small right pleural effusion. Probable bibasilar atelectasis. Stable cardiomediastinal silhouette. No acute osseous abnormality. IMPRESSION: Unchanged small right pleural effusion and presumed basilar atelectasis. Electronically Signed   By: Placido Sou M.D.   On: 07/28/2022 17:39   CT Angio Chest PE W and/or Wo Contrast  Result Date: 07/26/2022 CLINICAL DATA:  Pulmonary embolism (PE) suspected, high prob EXAM: CT ANGIOGRAPHY CHEST WITH CONTRAST TECHNIQUE: Multidetector CT imaging of the chest was performed using the standard protocol  during bolus administration of intravenous contrast. Multiplanar CT image reconstructions and MIPs were obtained to evaluate the vascular anatomy. RADIATION DOSE REDUCTION: This exam was performed according to the departmental dose-optimization program which includes automated exposure control, adjustment of the mA and/or kV according to patient size and/or use of iterative reconstruction technique. CONTRAST:  147m OMNIPAQUE IOHEXOL 350 MG/ML SOLN COMPARISON:  Same day chest x-ray FINDINGS: Cardiovascular: Satisfactory opacification of the pulmonary arteries to the segmental level. No evidence of pulmonary embolism. Distal aortic arch measures 4.0 cm in diameter. Coronary artery atherosclerosis. Mild cardiomegaly. No pericardial effusion. Mediastinum/Nodes: 11 mm lower right paratracheal node (series 4, image 62). No axillary or hilar lymphadenopathy. Thyroid, trachea, and esophagus demonstrate no significant findings. Lungs/Pleura: Prior right lower lobe lobectomy. Small right-sided pleural effusion.  Mild ground-glass attenuation within the dependent portion of the right lung. Mild centrilobular emphysema. No pneumothorax. Upper Abdomen: Reflux of contrast into the IVC and hepatic veins. No acute findings within the included upper abdomen. Musculoskeletal: No chest wall abnormality. No acute or significant osseous findings. Review of the MIP images confirms the above findings. IMPRESSION: 1. No evidence of pulmonary embolism. 2. Small right-sided pleural effusion .Mild ground-glass attenuation within the dependent portion of the right lung, which may represent atelectasis or possibly developing infection. 3. Prior right lower lobe lobectomy. 4. Mild cardiomegaly with reflux of contrast into the IVC and hepatic veins, suggesting right heart dysfunction. 5. Distal aortic arch measures up to 4.0 cm in diameter. Recommend semi-annual imaging followup by CTA or MRA and referral to cardiothoracic surgery if not already obtained. This recommendation follows 2010 ACCF/AHA/AATS/ACR/ASA/SCA/SCAI/SIR/STS/SVM Guidelines for the Diagnosis and Management of Patients With Thoracic Aortic Disease. Circulation. 2010; 121:: U542-H06 Aortic aneurysm NOS (ICD10-I71.9) Electronically Signed   By: NDavina PokeD.O.   On: 07/26/2022 10:46   DG Chest 2 View  Result Date: 07/26/2022 CLINICAL DATA:  Shortness of breath EXAM: CHEST - 2 VIEW COMPARISON:  07/13/2022 FINDINGS: Small right pleural effusion. Probable mild atelectasis at the lung bases. Stable heart size and mediastinal contours. No edema or consolidation. IMPRESSION: Small right pleural effusion. Electronically Signed   By: JJorje GuildM.D.   On: 07/26/2022 07:16   DG Chest 2 View  Result Date: 07/13/2022 CLINICAL DATA:  Shortness of breath EXAM: CHEST - 2 VIEW COMPARISON:  08/30/2010 FINDINGS: The heart size and mediastinal contours are within normal limits. Increased interstitial markings within the right mid lung and bilateral lung bases, right greater than  left. No pleural effusion or pneumothorax. The visualized skeletal structures are unremarkable. IMPRESSION: Increased interstitial markings within the right mid lung and bilateral lung bases, right greater than left. Findings may reflect asymmetric edema versus atypical/viral infection. Electronically Signed   By: NDavina PokeD.O.   On: 07/13/2022 11:18    Microbiology: Recent Results (from the past 240 hour(s))  MRSA Next Gen by PCR, Nasal     Status: None   Collection Time: 07/30/22  5:25 AM   Specimen: Nasal Mucosa; Nasal Swab  Result Value Ref Range Status   MRSA by PCR Next Gen NOT DETECTED NOT DETECTED Final    Comment: (NOTE) The GeneXpert MRSA Assay (FDA approved for NASAL specimens only), is one component of a comprehensive MRSA colonization surveillance program. It is not intended to diagnose MRSA infection nor to guide or monitor treatment for MRSA infections. Test performance is not FDA approved in patients less than 219years old. Performed at [facility] [address],  [place], [place] [address]      Labs: Basic Metabolic Panel: Recent Labs  Lab 08/01/22 0445 08/02/22 0456 08/03/22 0432 08/04/22 0550 08/05/22 0544  NA 138 138 136 138 135  K 4.5 4.9 4.2 4.3 4.3  CL 106 106 105 105 101  CO2 '25 25 24 26 26  '$ GLUCOSE 126* 113* 112* 109* 106*  BUN '15 15 16 15 17  '$ CREATININE 1.40* 1.29* 1.35* 1.26* 1.27*  CALCIUM 9.0 8.9 8.8* 9.1 9.1  MG  --  2.3 2.2  --   --    Liver Function Tests: No results for input(s): "AST", "ALT", "ALKPHOS", "BILITOT", "PROT", "ALBUMIN" in the last 168 hours. No results for input(s): "LIPASE", "AMYLASE" in the last 168 hours. No results for input(s): "AMMONIA" in the last 168 hours. CBC: Recent Labs  Lab 07/30/22 1314 07/30/22 1317 07/30/22 1318 08/03/22 0432  WBC  --   --   --  9.8  HGB 12.9* 13.3 12.9* 13.1  HCT 38.0* 39.0 38.0* 40.2  MCV  --   --   --  90.3  PLT  --   --   --  179   Cardiac Enzymes: No results  for input(s): "CKTOTAL", "CKMB", "CKMBINDEX", "TROPONINI" in the last 168 hours. BNP: BNP (last 3 results) Recent Labs    07/26/22 0716 07/28/22 1706  BNP 699.3* 1,204.3*    ProBNP (last 3 results) No results for input(s): "PROBNP" in the last 8760 hours.  CBG: Recent Labs  Lab 08/01/22 1044 08/01/22 1620  GLUCAP 122* 113*       Signed:  Domenic Polite MD.  [facility] 08/05/2022, 11:53 AM

## 2022-08-05 NOTE — Progress Notes (Signed)
Advanced Heart Failure Rounding Note  PCP-Cardiologist: None   Subjective:    Feels great. Co-ox 61% No SOB or CP. SBP looks good. 1-2 PVC/min on amio    Objective:   Weight Range: 99 kg Body mass index is 30.43 kg/m.   Vital Signs:   Temp:  [98 F (36.7 C)-98.5 F (36.9 C)] 98.5 F (36.9 C) (08/20 1303) Pulse Rate:  [84-89] 84 (08/20 0815) Resp:  [18-19] 19 (08/20 1303) BP: (111-136)/(75-89) 111/83 (08/20 1303) SpO2:  [97 %-98 %] 98 % (08/20 1303) Weight:  [99 kg] 99 kg (08/20 0606) Last BM Date : 08/04/22  Weight change: Filed Weights   08/02/22 0518 08/03/22 0413 08/05/22 0606  Weight: 100.9 kg 100.8 kg 99 kg    Intake/Output:   Intake/Output Summary (Last 24 hours) at 08/05/2022 1449 Last data filed at 08/05/2022 0944 Gross per 24 hour  Intake 240 ml  Output 1900 ml  Net -1660 ml      Physical Exam   General:  Well appearing. No resp difficulty HEENT: normal Neck: supple. no JVD. Carotids 2+ bilat; no bruits. No lymphadenopathy or thryomegaly appreciated. Cor: PMI nondisplaced. Regular rate & rhythm. No rubs, gallops or murmurs. Lungs: clear Abdomen: soft, nontender, nondistended. No hepatosplenomegaly. No bruits or masses. Good bowel sounds. Extremities: no cyanosis, clubbing, rash, edema Neuro: alert & orientedx3, cranial nerves grossly intact. moves all 4 extremities w/o difficulty. Affect pleasant  Telemetry   NSR PVCs about 1-2/min  Labs    CBC Recent Labs    08/03/22 0432  WBC 9.8  HGB 13.1  HCT 40.2  MCV 90.3  PLT 035    Basic Metabolic Panel Recent Labs    08/03/22 0432 08/04/22 0550 08/05/22 0544  NA 136 138 135  K 4.2 4.3 4.3  CL 105 105 101  CO2 $Re'24 26 26  'rJs$ GLUCOSE 112* 109* 106*  BUN $Re'16 15 17  'iOH$ CREATININE 1.35* 1.26* 1.27*  CALCIUM 8.8* 9.1 9.1  MG 2.2  --   --     Liver Function Tests No results for input(s): "AST", "ALT", "ALKPHOS", "BILITOT", "PROT", "ALBUMIN" in the last 72 hours.  No results for  input(s): "LIPASE", "AMYLASE" in the last 72 hours. Cardiac Enzymes No results for input(s): "CKTOTAL", "CKMB", "CKMBINDEX", "TROPONINI" in the last 72 hours.  BNP: BNP (last 3 results) Recent Labs    07/26/22 0716 07/28/22 1706  BNP 699.3* 1,204.3*     ProBNP (last 3 results) No results for input(s): "PROBNP" in the last 8760 hours.   D-Dimer No results for input(s): "DDIMER" in the last 72 hours. Hemoglobin A1C No results for input(s): "HGBA1C" in the last 72 hours.  Fasting Lipid Panel No results for input(s): "CHOL", "HDL", "LDLCALC", "TRIG", "CHOLHDL", "LDLDIRECT" in the last 72 hours. Thyroid Function Tests No results for input(s): "TSH", "T4TOTAL", "T3FREE", "THYROIDAB" in the last 72 hours.  Invalid input(s): "FREET3"  Other results:   Imaging    No results found.   Medications:     Scheduled Medications:  amiodarone  200 mg Oral BID   amiodarone  200 mg Oral Once   aspirin EC  81 mg Oral Daily   atorvastatin  20 mg Oral Daily   Chlorhexidine Gluconate Cloth  6 each Topical Daily   digoxin  0.125 mg Oral Daily   empagliflozin  10 mg Oral Daily   enoxaparin (LOVENOX) injection  40 mg Subcutaneous Q24H   gabapentin  400 mg Oral TID   multivitamin with minerals  1 tablet Oral Daily   polyethylene glycol  17 g Oral Daily   sertraline  100 mg Oral Daily   sodium chloride flush  10-40 mL Intracatheter Q12H   sodium chloride flush  3 mL Intravenous Q12H   sodium chloride flush  3 mL Intravenous Q12H   sodium chloride flush  3 mL Intravenous Q12H   spironolactone  25 mg Oral Daily   umeclidinium bromide  1 puff Inhalation Daily    Infusions:  sodium chloride Stopped (07/31/22 1049)    PRN Medications: sodium chloride, acetaminophen **OR** acetaminophen, albuterol, methocarbamol, ondansetron (ZOFRAN) IV, sodium chloride flush, sodium chloride flush    Patient Profile  Jose Ferguson is a 66 y/o male. [employer] with h/o COPD, OSA, HTN, HCV,  colon CA with met to lung 2001 s/p chemo with colon/lung resection. Denies h/o known heart disease. Smoked for a long time but quit in 2001 when he was found to have colon/lung/prostate CA.  New acute HFrEF.   Assessment/Plan   1. Acute systolic HF - new diagnosis EF < 20% on echo with mild to moderate RV dysfunction and moderate to severe functional MR  - Cath this admit with minimal non obs CAD. Unable to obtain cMRI due to claustrophobia.  - Etiology unclear. Suspect possible PVC-mediated - Lactate back at 3.2-->2.8 . Started on milrinone.  - Failed milrinone wean, CO-OX down to 30s-40s. Milrinone added back at 0.125 on 08/16. - Milrinone stopped 8/18  - Co-ox 601% today.  - Volume status ok - Hold bb.  - Continue spiro 25 mg daily  - Continue jardiance 10 mg daily  - Continue digoxin 0.125 - Off Entresto with hypotension - BP improved. Can stop midodrine. Add losartan as outpatient - CR   2. Mitral regurgitation - functional - hopefully will improve with treatment of HF   3. COPD - stable quit smoking 2001   4. H/o colon CA s/p partial colectomy 2001 and chemo  5. H/o lung CA s/p partial lobectomy - details unclear   6. OSA - unable to tolerate CPAP   6. PVCs  - 1-2/min on tele this am. Continue po amio   Home today on current meds (minus midodrine). Med d/w PharmD  Has HF f/u next week.   Length of Stay: [place], MD  08/05/2022, 2:49 PM  Advanced Heart Failure Team Pager [phone] (M-F; 7a - 5p)  Please contact [facility] for night-coverage after hours (5p -7a ) and weekends on [email or web address]

## 2022-08-08 ENCOUNTER — Telehealth: Payer: Self-pay

## 2022-08-08 NOTE — Telephone Encounter (Signed)
NOTES SCANNED TO REFERRAL 

## 2022-08-22 ENCOUNTER — Ambulatory Visit
Payer: No Typology Code available for payment source | Attending: Cardiovascular Disease | Admitting: Cardiovascular Disease

## 2022-08-22 ENCOUNTER — Encounter: Payer: Self-pay | Admitting: Cardiovascular Disease

## 2022-08-22 VITALS — BP 118/80 | HR 81 | Ht 71.0 in | Wt 223.0 lb

## 2022-08-22 DIAGNOSIS — I2510 Atherosclerotic heart disease of native coronary artery without angina pectoris: Secondary | ICD-10-CM | POA: Diagnosis not present

## 2022-08-22 DIAGNOSIS — I5022 Chronic systolic (congestive) heart failure: Secondary | ICD-10-CM

## 2022-08-22 DIAGNOSIS — I493 Ventricular premature depolarization: Secondary | ICD-10-CM

## 2022-08-22 DIAGNOSIS — I428 Other cardiomyopathies: Secondary | ICD-10-CM | POA: Diagnosis not present

## 2022-08-22 MED ORDER — LOSARTAN POTASSIUM 25 MG PO TABS: 25.0000 mg | ORAL_TABLET | Freq: Every day | ORAL | 1 refills | Status: DC

## 2022-08-22 NOTE — Progress Notes (Signed)
Chief Complaint  Patient presents with   Follow-up   New Patient (Initial Visit)    CAD, cardiomyopathy     History of Present Illness: 66 yo male with history of colon cancer, prostate cancer, COPD, HTN, sleep apnea, former tobacco abuse, CAD, non-ischemic cardiomyopathy, chronic systolic CHF who is here today for hospital follow up. He was admitted to [facility] 8/12-8/20/23 with volume overload. He was found to have CHF and was followed by our Advanced Heart Failure team. Dr. Haroldine Laws is his primary cardiologist after recent admission. Echo 07/29/22 with LVEF less than 20% with global hypokinesis. Severe RV dysfunction. Moderate to severe MR. Cardiac cath 07/30/22 with mild non-obstructive CAD. No cardiac MRI due to claustrophobia. 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 He was started on milrione. He did not tolerate Entresto due to hypotension. Beta blocker was not started by the Heart Failure team. He was discharged on amiodarone for suppression of PVCs. Plans to start Losartan as an outpatient.   He is here today for follow up. He has occasional chest pains. Minimal dyspnea. He felt dizzy when standing from bed last week and thinks he passed out for a few seconds. No recurrent dizziness or near syncope. The patient denies any palpitations, lower extremity edema, orthopnea, PND. He sleeps on his side. He has not been wearing his CPAP due to claustrophobia.    Primary Care Physician: [facility]   Past Medical History:  Diagnosis Date   Cancer [facility])    colon cancer 2001, prostate cancer 2011   COPD (chronic obstructive pulmonary disease) ([place])    Emphysema of lung ([place])    Hypertension    PTSD (post-traumatic stress disorder)    Seasonal allergies     Past Surgical History:  Procedure Laterality Date   BREAST SURGERY     COLON RESECTION  12/18/1999   laceration hand  12/17/1978   right hand   laceration leg  12/17/1980   left leg   LUNG LOBECTOMY      PROSTATECTOMY  09/16/2010   RIGHT/LEFT HEART CATH AND CORONARY ANGIOGRAPHY N/A 07/30/2022   Procedure: RIGHT/LEFT HEART CATH AND CORONARY ANGIOGRAPHY;  Surgeon: Jolaine Artist, MD;  Location: [place] [facility];  Service: Cardiovascular;  Laterality: N/A;    Current Outpatient Medications  Medication Sig Dispense Refill   albuterol (PROVENTIL HFA) 108 (90 Base) MCG/ACT inhaler Inhale 2 puffs into the lungs. Every 4 to 6 hours as needed for wheezing and shortness of breath     amiodarone (PACERONE) 200 MG tablet Take 1 tablet (200 mg total) by mouth 2 (two) times daily. 60 tablet 0   aspirin EC 81 MG tablet Take 1 tablet (81 mg total) by mouth daily. Swallow whole. 100 tablet 0   atorvastatin (LIPITOR) 20 MG tablet Take 1 tablet (20 mg total) by mouth daily. 30 tablet 0   digoxin (LANOXIN) 0.125 MG tablet Take 1 tablet (0.125 mg total) by mouth daily. 30 tablet 0   docusate sodium (COLACE) 100 MG capsule Take 100 mg by mouth daily as needed for constipation.     empagliflozin (JARDIANCE) 10 MG TABS tablet Take 1 tablet (10 mg total) by mouth daily. 30 tablet 0   fluticasone (FLONASE) 50 MCG/ACT nasal spray 1 spray by Nasal route 2 (two) times daily.       gabapentin (NEURONTIN) 400 MG capsule Take 1 capsule (400 mg total) by mouth 3 (three) times daily. 90 capsule 0   HYDROcodone-acetaminophen (NORCO/VICODIN) 5-325  MG tablet Take 1 tablet by mouth every 6 (six) hours as needed for moderate pain. 6 tablet 0   leuprolide, 6 Month, (LEUPROLIDE ACETATE, 6 MONTH,) 45 MG injection Inject 45 mg into the skin. Every 6 months     loratadine (CLARITIN) 10 MG tablet Take 10 mg by mouth daily.     losartan (COZAAR) 25 MG tablet Take 1 tablet (25 mg total) by mouth daily. 90 tablet 1   Magnesium Oxide (MAOX) 420 MG TABS Take 400 mg by mouth daily.     methocarbamol (ROBAXIN) 500 MG tablet Take 500 mg by mouth 2 (two) times daily.     sertraline (ZOLOFT) 100 MG tablet Take 100 mg by mouth daily.      spironolactone (ALDACTONE) 25 MG tablet Take 1 tablet (25 mg total) by mouth daily. 30 tablet 0   Tetrahydrozoline HCl (VISINE OP) Place 1 drop into both eyes daily as needed (red eye).     tiotropium (SPIRIVA) 18 MCG inhalation capsule Place 18 mcg into inhaler and inhale daily.     No current facility-administered medications for this visit.    No Known Allergies  Social History   Socioeconomic History   Marital status: Divorced    Spouse name: Not on file   Number of children: Not on file   Years of education: Not on file   Highest education level: Not on file  Occupational History   Not on file  Tobacco Use   Smoking status: Former    Types: Cigarettes    Quit date: 10/23/2009    Years since quitting: 12.8   Smokeless tobacco: Former    Quit date: 10/23/2009  Vaping Use   Vaping Use: Never used  Substance and Sexual Activity   Alcohol use: Yes    Alcohol/week: 14.0 standard drinks of alcohol    Types: 14 Cans of beer per week   Drug use: No   Sexual activity: Not on file  Other Topics Concern   Not on file  Social History Narrative   Not on file   Social Determinants of Health   Financial Resource Strain: Not on file  Food Insecurity: Not on file  Transportation Needs: Not on file  Physical Activity: Not on file  Stress: Not on file  Social Connections: Not on file  Intimate Partner Violence: Not on file    Family History  Problem Relation Age of Onset   Colon cancer Father 75   Colon cancer Sister 3   Stomach cancer Neg Hx     Review of Systems:  As stated in the HPI and otherwise negative.   BP 118/80   Pulse 81   Ht '5\' 11"'$  (1.803 m)   Wt 223 lb (101.2 kg)   SpO2 98%   BMI 31.10 kg/m   Physical Examination: General: Well developed, well nourished, NAD  HEENT: OP clear, mucus membranes moist  SKIN: warm, dry. No rashes. Neuro: No focal deficits  Musculoskeletal: Muscle strength 5/5 all ext  Psychiatric: Mood and affect normal  Neck: No  JVD, no carotid bruits, no thyromegaly, no lymphadenopathy.  Lungs:Clear bilaterally, no wheezes, rhonci, crackles Cardiovascular: Regular rate and rhythm. No murmurs, gallops or rubs. Abdomen:Soft. Bowel sounds present. Non-tender.  Extremities: No lower extremity edema. Pulses are 2 + in the bilateral DP/PT.  EKG:  EKG is not ordered today. The ekg ordered today demonstrates   Echo 07/29/22:  1. Severely reduced LV function. LVOT VTI ~9 cm. CO 3 L/min @ HR  100 bpm.  CI 1.4 L/min/m2. No LV thrombus on contrast imaging. Left ventricular  ejection fraction, by estimation, is <20%. The left ventricle has severely  decreased function. The left  ventricle demonstrates global hypokinesis. The left ventricular internal  cavity size was moderately to severely dilated. Indeterminate diastolic  filling due to E-A fusion.   2. Right ventricular systolic function is severely reduced. The right  ventricular size is mildly enlarged. There is mildly elevated pulmonary  artery systolic pressure. The estimated right ventricular systolic  pressure is 94.0 mmHg.   3. Left atrial size was mildly dilated.   4. Moderate to severe MR. Best seen on apical 3c view. Likely functional.  The mitral valve is grossly normal. Moderate to severe mitral valve  regurgitation. No evidence of mitral stenosis.   5. The aortic valve is tricuspid. Aortic valve regurgitation is not  visualized. No aortic stenosis is present.   6. The inferior vena cava is normal in size with <50% respiratory  variability, suggesting right atrial pressure of 8 mmHg.   Recent Labs: 07/26/2022: TSH 2.557 07/28/2022: B Natriuretic Peptide 1,204.3 07/29/2022: ALT 25 08/03/2022: Hemoglobin 13.1; Magnesium 2.2; Platelets 179 08/05/2022: BUN 17; Creatinine, Ser 1.27; Potassium 4.3; Sodium 135   Lipid Panel    Component Value Date/Time   CHOL 132 07/30/2022 0525   TRIG 113 07/30/2022 0525   HDL 30 (L) 07/30/2022 0525   CHOLHDL 4.4 07/30/2022  0525   VLDL 23 07/30/2022 0525   LDLCALC 79 07/30/2022 0525     Wt Readings from Last 3 Encounters:  08/22/22 223 lb (101.2 kg)  08/05/22 218 lb 3.2 oz (99 kg)  07/26/22 240 lb (108.9 kg)    Assessment and Plan:   1. NICM/chronic systolic CHF: He is doing well. He has minimal dyspnea and rare chest pains. Volume status is normal today. Weight is overall stable. He seems to be well compensated.  -Continue digoxin 0.125 mg daily, Jardiance, aldactone 25 mg daily.  -Will start Losartan 25 mg daily. He did not tolerate Entresto -Will not start a beta blocker yet. Will let the Heart Failure team review this   2. CAD without angina: Minimal CAD by recent cath. Continue ASA and statin  3. PVCs: No ectopy on exam today. Continue amiodarone.   4. Sleep apnea: He does not tolerate CPAP  Labs/ tests ordered today include:  No orders of the defined types were placed in this encounter.  Disposition:   F/U with the Advanced CHF clinic in 3-4 weeks. Long term follow up in their clinic.   Signed, Lauree Chandler, MD 08/22/2022 10:42 AM    [facility] [address], [place], [place]  [address] Phone: [phone]; Fax: [phone]

## 2022-08-22 NOTE — Patient Instructions (Signed)
Medication Instructions:  Your physician has recommended you make the following change in your medication:  Start losartan 25 mg by mouth daily  *If you need a refill on your cardiac medications before your next appointment, please call your pharmacy*   Lab Work: none If you have labs (blood work) drawn today and your tests are completely normal, you will receive your results only by: [place] (if you have MyChart) OR A paper copy in the mail If you have any lab test that is abnormal or we need to change your treatment, we will call you to review the results.   Testing/Procedures: none   Follow-Up: At [facility], you and your health needs are our priority.  As part of our continuing mission to provide you with exceptional heart care, we have created designated Provider Care Teams.  These Care Teams include your primary Cardiologist (physician) and Advanced Practice Providers (APPs -  Physician Assistants and Nurse Practitioners) who all work together to provide you with the care you need, when you need it.  We recommend signing up for the patient portal called "MyChart".  Sign up information is provided on this After Visit Summary.  MyChart is used to connect with patients for Virtual Visits (Telemedicine).  Patients are able to view lab/test results, encounter notes, upcoming appointments, etc.  Non-urgent messages can be sent to your provider as well.   To learn more about what you can do with MyChart, go to [email or web address].    Your next appointment:   2 -4 week(s)  The format for your next appointment:   In Person  Provider:   CHF clinic.  Patient was seen by CHF team during recent hospitalization and needs follow up    Other Instructions  Follow up with Dr Angelena Form will be as needed  Important Information About Sugar

## 2022-08-27 ENCOUNTER — Encounter (HOSPITAL_COMMUNITY): Payer: No Typology Code available for payment source | Admitting: Internal Medicine

## 2022-09-05 NOTE — Progress Notes (Incomplete)
Advanced Heart Failure Team Clinic Note   [facility] Primary Cardiologist: Dr. Haroldine Laws  HPI:  66 y/o male. [employer] fast stream[employer] with h/o recently diagnosed systolic CHF, COPD, OSA unable to tolerate CPAP, HTN, HCV, colon CA with met to lung 2001 s/p chemo with colon/lung resection. Smoked for a long time but quit in 2001 when he was found to have colon/lung/prostate CA.   Admitted to [facility] on 07/28/22 with acute systolic CHF with cardiogenic shock. BNP up to 1204. Echo EF < 20% RV mild to moderately down. Moderate to severe functional MR.  Required inotrope support with milrinone and diuresed with IV lasix. GDMT titrated.   R/LHC on 0.125 milrinone with nonobstructive CAD, RA 4, PA mean 23, PCWP 9, Fick CO/CI 4.7/2.1, PA sat 57%/58%.    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 Mexiletine initially started but later switched to amiodarone d/t ineffectiveness. Unable to obtain cMRI d/t claustrophobia.  Failed initial milrinone wean, but later weaned off with stable CO-OX prior to discharge.  He is here today for hospital f/u.   ROS: All systems negative except as listed in HPI, PMH and Problem List.  SH:  Social History   Socioeconomic History   Marital status: Divorced    Spouse name: Not on file   Number of children: Not on file   Years of education: Not on file   Highest education level: Not on file  Occupational History   Not on file  Tobacco Use   Smoking status: Former    Types: Cigarettes    Quit date: 10/23/2009    Years since quitting: 12.8   Smokeless tobacco: Former    Quit date: 10/23/2009  Vaping Use   Vaping Use: Never used  Substance and Sexual Activity   Alcohol use: Yes    Alcohol/week: 14.0 standard drinks of alcohol    Types: 14 Cans of beer per week   Drug use: No   Sexual activity: Not on file  Other Topics Concern   Not on file  Social History Narrative   Not on file   Social Determinants of Health   Financial Resource Strain: Not on file  Food  Insecurity: Not on file  Transportation Needs: Not on file  Physical Activity: Not on file  Stress: Not on file  Social Connections: Not on file  Intimate Partner Violence: Not on file    FH:  Family History  Problem Relation Age of Onset   Colon cancer Father 10   Colon cancer Sister 2   Stomach cancer Neg Hx     Past Medical History:  Diagnosis Date   Cancer ([place])    colon cancer 2001, prostate cancer 2011   COPD (chronic obstructive pulmonary disease) ([facility])    Emphysema of lung ([facility])    Hypertension    PTSD (post-traumatic stress disorder)    Seasonal allergies     Current Outpatient Medications  Medication Sig Dispense Refill   albuterol (PROVENTIL HFA) 108 (90 Base) MCG/ACT inhaler Inhale 2 puffs into the lungs. Every 4 to 6 hours as needed for wheezing and shortness of breath     amiodarone (PACERONE) 200 MG tablet Take 1 tablet (200 mg total) by mouth 2 (two) times daily. 60 tablet 0   aspirin EC 81 MG tablet Take 1 tablet (81 mg total) by mouth daily. Swallow whole. 100 tablet 0   atorvastatin (LIPITOR) 20 MG tablet Take 1 tablet (20 mg total) by mouth daily. 30 tablet 0   digoxin (LANOXIN) 0.125 MG  tablet Take 1 tablet (0.125 mg total) by mouth daily. 30 tablet 0   docusate sodium (COLACE) 100 MG capsule Take 100 mg by mouth daily as needed for constipation.     empagliflozin (JARDIANCE) 10 MG TABS tablet Take 1 tablet (10 mg total) by mouth daily. 30 tablet 0   fluticasone (FLONASE) 50 MCG/ACT nasal spray 1 spray by Nasal route 2 (two) times daily.       gabapentin (NEURONTIN) 400 MG capsule Take 1 capsule (400 mg total) by mouth 3 (three) times daily. 90 capsule 0   HYDROcodone-acetaminophen (NORCO/VICODIN) 5-325 MG tablet Take 1 tablet by mouth every 6 (six) hours as needed for moderate pain. 6 tablet 0   leuprolide, 6 Month, (LEUPROLIDE ACETATE, 6 MONTH,) 45 MG injection Inject 45 mg into the skin. Every 6 months     loratadine (CLARITIN) 10 MG tablet Take 10 mg  by mouth daily.     losartan (COZAAR) 25 MG tablet Take 1 tablet (25 mg total) by mouth daily. 90 tablet 1   Magnesium Oxide (MAOX) 420 MG TABS Take 400 mg by mouth daily.     methocarbamol (ROBAXIN) 500 MG tablet Take 500 mg by mouth 2 (two) times daily.     sertraline (ZOLOFT) 100 MG tablet Take 100 mg by mouth daily.     spironolactone (ALDACTONE) 25 MG tablet Take 1 tablet (25 mg total) by mouth daily. 30 tablet 0   Tetrahydrozoline HCl (VISINE OP) Place 1 drop into both eyes daily as needed (red eye).     tiotropium (SPIRIVA) 18 MCG inhalation capsule Place 18 mcg into inhaler and inhale daily.     No current facility-administered medications for this visit.    There were no vitals filed for this visit.  PHYSICAL EXAM:  General:  Well appearing. No resp difficulty HEENT: normal Neck: supple. JVP flat. Carotids 2+ bilaterally; no bruits. No lymphadenopathy or thryomegaly appreciated. Cor: PMI normal. Regular rate & rhythm. No rubs, gallops or murmurs. Lungs: clear Abdomen: soft, nontender, nondistended. No hepatosplenomegaly. No bruits or masses. Good bowel sounds. Extremities: no cyanosis, clubbing, rash, edema Neuro: alert & orientedx3, cranial nerves grossly intact. Moves all 4 extremities w/o difficulty. Affect pleasant.   ECG:   ASSESSMENT & PLAN:  1. Acute systolic HF - new diagnosis 08/23 - required milrinone for support.  - Echo 08/23: EF < 20%, mild to moderate RV dysfunction, moderate to severe functional MR  - R/LHC with minimal non obs CAD and moderately reduced CO on 0.125 milrinone.  - Unable to obtain cMRI due to claustrophobia.  - Etiology unclear. Suspect possible PVC-mediated - NYHA ***. Volume ***.  - No beta blocker for now with recent low-output HF  - Continue spiro 25 mg daily  - Continue jardiance 10 mg daily  - Continue digoxin 0.125 - Off Entresto with hypotension. Losartan ? ***   2. Mitral regurgitation - functional - hopefully will improve  with treatment of HF   3. COPD - stable quit smoking 2001   4. H/o colon CA s/p partial colectomy 2001 and chemo   5. H/o lung CA s/p partial lobectomy - details unclear   6. OSA - unable to tolerate CPAP    6. PVCs  - On po amio 200 mg daily - Failed mexiletine   F/u:

## 2022-09-06 ENCOUNTER — Telehealth (HOSPITAL_COMMUNITY): Payer: Self-pay | Admitting: Cardiology

## 2022-09-06 ENCOUNTER — Ambulatory Visit (HOSPITAL_COMMUNITY)
Admission: RE | Admit: 2022-09-06 | Discharge: 2022-09-06 | Disposition: A | Discharge status: Home / Self Care | Payer: No Typology Code available for payment source | Source: Ambulatory Visit | Attending: Physician Assistant | Admitting: Physician Assistant

## 2022-09-06 ENCOUNTER — Inpatient Hospital Stay (HOSPITAL_COMMUNITY)
Admission: RE | Admit: 2022-09-06 | Discharge: 2022-09-06 | Disposition: A | Discharge status: Home / Self Care | Payer: No Typology Code available for payment source | Source: Ambulatory Visit | Attending: Internal Medicine | Admitting: Internal Medicine

## 2022-09-06 ENCOUNTER — Encounter (HOSPITAL_COMMUNITY): Payer: Self-pay

## 2022-09-06 ENCOUNTER — Other Ambulatory Visit (HOSPITAL_COMMUNITY): Payer: Self-pay | Admitting: Internal Medicine

## 2022-09-06 VITALS — BP 132/88 | HR 79 | Ht 71.0 in | Wt 224.0 lb

## 2022-09-06 DIAGNOSIS — Z85118 Personal history of other malignant neoplasm of bronchus and lung: Secondary | ICD-10-CM | POA: Diagnosis not present

## 2022-09-06 DIAGNOSIS — G4733 Obstructive sleep apnea (adult) (pediatric): Secondary | ICD-10-CM | POA: Diagnosis not present

## 2022-09-06 DIAGNOSIS — Z85038 Personal history of other malignant neoplasm of large intestine: Secondary | ICD-10-CM | POA: Insufficient documentation

## 2022-09-06 DIAGNOSIS — R55 Syncope and collapse: Secondary | ICD-10-CM

## 2022-09-06 DIAGNOSIS — J449 Chronic obstructive pulmonary disease, unspecified: Secondary | ICD-10-CM | POA: Insufficient documentation

## 2022-09-06 DIAGNOSIS — I428 Other cardiomyopathies: Secondary | ICD-10-CM | POA: Insufficient documentation

## 2022-09-06 DIAGNOSIS — I5022 Chronic systolic (congestive) heart failure: Secondary | ICD-10-CM | POA: Diagnosis not present

## 2022-09-06 DIAGNOSIS — Z87891 Personal history of nicotine dependence: Secondary | ICD-10-CM | POA: Insufficient documentation

## 2022-09-06 DIAGNOSIS — I340 Nonrheumatic mitral (valve) insufficiency: Secondary | ICD-10-CM | POA: Insufficient documentation

## 2022-09-06 DIAGNOSIS — I493 Ventricular premature depolarization: Secondary | ICD-10-CM | POA: Diagnosis not present

## 2022-09-06 DIAGNOSIS — I2510 Atherosclerotic heart disease of native coronary artery without angina pectoris: Secondary | ICD-10-CM | POA: Diagnosis not present

## 2022-09-06 DIAGNOSIS — I5020 Unspecified systolic (congestive) heart failure: Secondary | ICD-10-CM | POA: Insufficient documentation

## 2022-09-06 LAB — COMPREHENSIVE METABOLIC PANEL
ALT: 17 U/L (ref 0–44)
AST: 24 U/L (ref 15–41)
Albumin: 3.5 g/dL (ref 3.5–5.0)
Alkaline Phosphatase: 46 U/L (ref 38–126)
Anion gap: 7 (ref 5–15)
BUN: 13 mg/dL (ref 8–23)
CO2: 25 mmol/L (ref 22–32)
Calcium: 9.1 mg/dL (ref 8.9–10.3)
Chloride: 108 mmol/L (ref 98–111)
Creatinine, Ser: 1.27 mg/dL — ABNORMAL HIGH (ref 0.61–1.24)
GFR, Estimated: >60 mL/min (ref >60)
Glucose, Bld: 121 mg/dL — ABNORMAL HIGH (ref 70–99)
Potassium: 4.1 mmol/L (ref 3.5–5.1)
Sodium: 140 mmol/L (ref 135–145)
Total Bilirubin: 0.7 mg/dL (ref 0.3–1.2)
Total Protein: 6.3 g/dL — ABNORMAL LOW (ref 6.5–8.1)

## 2022-09-06 LAB — DIGOXIN LEVEL: Digoxin Level: 2.2 ng/mL — ABNORMAL HIGH (ref 0.8–2.0)

## 2022-09-06 MED ORDER — ASPIRIN 81 MG PO TBEC: 81.0000 mg | DELAYED_RELEASE_TABLET | Freq: Every day | ORAL | 3 refills | Status: AC

## 2022-09-06 MED ORDER — AMIODARONE HCL 200 MG PO TABS: 200.0000 mg | ORAL_TABLET | Freq: Two times a day (BID) | ORAL | 3 refills | Status: DC

## 2022-09-06 MED ORDER — ATORVASTATIN CALCIUM 20 MG PO TABS: 20.0000 mg | ORAL_TABLET | Freq: Every day | ORAL | 3 refills | Status: DC

## 2022-09-06 MED ORDER — SPIRONOLACTONE 25 MG PO TABS: 25.0000 mg | ORAL_TABLET | Freq: Every day | ORAL | 3 refills | Status: AC

## 2022-09-06 MED ORDER — DIGOXIN 125 MCG PO TABS: 0.1250 mg | ORAL_TABLET | Freq: Every day | ORAL | 3 refills | Status: DC

## 2022-09-06 MED ORDER — ENTRESTO 24-26 MG PO TABS: 1.0000 | ORAL_TABLET | Freq: Two times a day (BID) | ORAL | 11 refills | Status: DC

## 2022-09-06 MED ORDER — ATORVASTATIN CALCIUM 20 MG PO TABS: 20.0000 mg | ORAL_TABLET | Freq: Every day | ORAL | 0 refills | Status: DC

## 2022-09-06 MED ORDER — EMPAGLIFLOZIN 10 MG PO TABS: 10.0000 mg | ORAL_TABLET | Freq: Every day | ORAL | 3 refills | Status: AC

## 2022-09-06 MED ORDER — ENTRESTO 24-26 MG PO TABS: 1.0000 | ORAL_TABLET | Freq: Two times a day (BID) | ORAL | 0 refills | Status: DC

## 2022-09-06 NOTE — Telephone Encounter (Signed)
-----   Message from Joette Catching, [place] sent at 09/06/2022 11:49 AM EDT ----- Disregard prior comment. Dig level previously low. Recommend repeating dig level but should be checked in next couple of days before he takes his am dose of dig.  Refer to paramedicine to assist with med management.

## 2022-09-06 NOTE — Telephone Encounter (Signed)
Patient called.  Patient aware. Repeat dig trough 9/22, will discuss para medicine at lab appt

## 2022-09-06 NOTE — Patient Instructions (Signed)
STOP Losartan  START Entresto 24/26 mg one tab twice a day  Labs today We will only contact you if something comes back abnormal or we need to make some changes. Otherwise no news is good news!  Labs needed in 2 weeks  Your provider has recommended that  you wear a Zio Patch for 14 days.  This monitor will record your heart rhythm for our review.  IF you have any symptoms while wearing the monitor please press the button.  If you have any issues with the patch or you notice a red or orange light on it please call the company at [phone].  Once you remove the patch please mail it back to the company as soon as possible so we can get the results.   Your physician recommends that you schedule a follow-up appointment in: 4 weeks  in the Advanced Practitioners (PA/NP) Clinic and in 3 months with Dr Haroldine Laws  Do the following things EVERYDAY: Weigh yourself in the morning before breakfast. Write it down and keep it in a log. Take your medicines as prescribed Eat low salt foods--Limit salt (sodium) to 2000 mg per day.  Stay as active as you can everyday Limit all fluids for the day to less than 2 liters   At the [facility], you and your health needs are our priority. As part of our continuing mission to provide you with exceptional heart care, we have created designated Provider Care Teams. These Care Teams include your primary Cardiologist (physician) and Advanced Practice Providers (APPs- Physician Assistants and Nurse Practitioners) who all work together to provide you with the care you need, when you need it.   You may see any of the following providers on your designated Care Team at your next follow up: Dr Glori Bickers Dr Loralie Champagne Dr. Roxana Hires, NP Lyda Jester, [place] [facility] [place], [place] Forestine Na, NP Audry Riles, PharmD   Please be sure to bring in all your medications bottles to every appointment.   If you  have any questions or concerns before your next appointment please send [place] a message through [place] or call our office at [phone].    TO LEAVE A MESSAGE FOR THE NURSE SELECT OPTION 2, PLEASE LEAVE A MESSAGE INCLUDING: YOUR NAME DATE OF BIRTH CALL BACK NUMBER REASON FOR CALL**this is important as we prioritize the call backs  YOU WILL RECEIVE A CALL BACK THE SAME DAY AS LONG AS YOU CALL BEFORE 4:00 PM

## 2022-09-07 ENCOUNTER — Ambulatory Visit (HOSPITAL_COMMUNITY)
Admission: RE | Admit: 2022-09-07 | Discharge: 2022-09-07 | Disposition: A | Discharge status: Home / Self Care | Payer: No Typology Code available for payment source | Source: Ambulatory Visit | Attending: Internal Medicine | Admitting: Internal Medicine

## 2022-09-07 DIAGNOSIS — I5022 Chronic systolic (congestive) heart failure: Secondary | ICD-10-CM | POA: Insufficient documentation

## 2022-09-07 LAB — BASIC METABOLIC PANEL
Anion gap: 9 (ref 5–15)
BUN: 12 mg/dL (ref 8–23)
CO2: 23 mmol/L (ref 22–32)
Calcium: 9.3 mg/dL (ref 8.9–10.3)
Chloride: 108 mmol/L (ref 98–111)
Creatinine, Ser: 1.27 mg/dL — ABNORMAL HIGH (ref 0.61–1.24)
GFR, Estimated: >60 mL/min (ref >60)
Glucose, Bld: 137 mg/dL — ABNORMAL HIGH (ref 70–99)
Potassium: 4.7 mmol/L (ref 3.5–5.1)
Sodium: 140 mmol/L (ref 135–145)

## 2022-09-07 LAB — DIGOXIN LEVEL: Digoxin Level: 1.1 ng/mL (ref 0.8–2.0)

## 2022-09-10 ENCOUNTER — Telehealth (HOSPITAL_COMMUNITY): Payer: Self-pay

## 2022-09-10 NOTE — Telephone Encounter (Signed)
-----   Message from Joette Catching, [place] sent at 09/07/2022  3:51 PM EDT ----- Dig level elevated at 1.1. Stop taking digoxin.  BMET was done today. He was not due to have this done until 10/04. He still needs the lab 2 weeks after starting entresto.

## 2022-09-10 NOTE — Telephone Encounter (Signed)
Patient advised and verbalized understanding. Med list updated to reflect changes. Patient has pending lab appt on 10/4

## 2022-09-19 ENCOUNTER — Other Ambulatory Visit (HOSPITAL_COMMUNITY): Payer: No Typology Code available for payment source

## 2022-09-19 ENCOUNTER — Ambulatory Visit (HOSPITAL_COMMUNITY)
Admission: RE | Admit: 2022-09-19 | Discharge: 2022-09-19 | Disposition: A | Discharge status: Home / Self Care | Payer: No Typology Code available for payment source | Source: Ambulatory Visit | Attending: Cardiology | Admitting: Cardiology

## 2022-09-19 DIAGNOSIS — I5022 Chronic systolic (congestive) heart failure: Secondary | ICD-10-CM | POA: Insufficient documentation

## 2022-09-19 LAB — BASIC METABOLIC PANEL
Anion gap: 8 (ref 5–15)
BUN: 11 mg/dL (ref 8–23)
CO2: 22 mmol/L (ref 22–32)
Calcium: 9 mg/dL (ref 8.9–10.3)
Chloride: 108 mmol/L (ref 98–111)
Creatinine, Ser: 1.31 mg/dL — ABNORMAL HIGH (ref 0.61–1.24)
GFR, Estimated: 59 mL/min — ABNORMAL LOW (ref >60)
Glucose, Bld: 163 mg/dL — ABNORMAL HIGH (ref 70–99)
Potassium: 4.2 mmol/L (ref 3.5–5.1)
Sodium: 138 mmol/L (ref 135–145)

## 2022-10-03 NOTE — Progress Notes (Signed)
Advanced Heart Failure Team Clinic Note   PCP: Lenn Sink Primary Cardiologist: Dr. Gala Romney  HPI: [name] isa 66 y/o male. Designer, television/film set with h/o recently diagnosed systolic CHF, COPD, OSA unable to tolerate CPAP, HTN, HCV, colon CA with met to lung 2001 s/p chemo with colon/lung resection. Smoked for a long time but quit in 2001 when he was found to have colon/lung/prostate CA.   Admitted to [facility] on 07/28/22 with acute systolic CHF with cardiogenic shock. BNP up to 1204. Echo EF < 20% RV mild to moderately down. Moderate to severe functional MR.  Required inotrope support with milrinone and diuresed with IV lasix. GDMT titrated. R/LHC on 0.125 milrinone with nonobstructive CAD, RA 4, PA mean 23, PCWP 9, Fick CO/CI 4.7/2.1, PA sat 57%/58%. 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 Mexiletine initially started but later switched to amiodarone d/t ineffectiveness. Unable to obtain cMRI d/t claustrophobia.  Today he returns for HF follow up with his wife. Last visit dig was stopped due to elevated level and switched to entresto. His wife was not aware of dig discontinuation so he has continued to take dig. Overall feeling fine. Denies SOB. Occasional he has mild shortness of breath. Denies PND/Orthopnea. Able to do a little more at home. Appetite ok. No fever or chills. Weight at home 217-218 pounds. Taking all medications. Request refills on all meds be sent to [facility].  Cardiac Testing Echo 07/2022 EF < 20% RV mild - mod down and mod-severe functional MR  Cath 07/2022  Nonobstructive CAD  RA 4, PA mean 23, PCWP 9, Fick CO/CI 4.7/2.1, PA sat 57%/58%  ROS: All systems negative except as listed in HPI, PMH and Problem List.  SH:  Social History   Socioeconomic History   Marital status: Divorced    Spouse name: Not on file   Number of children: Not on file   Years of education: Not on file   Highest education level: Not on file  Occupational History   Not on file  Tobacco Use   Smoking  status: Former    Types: Cigarettes    Quit date: 10/23/2009    Years since quitting: 12.9   Smokeless tobacco: Former    Quit date: 10/23/2009  Vaping Use   Vaping Use: Never used  Substance and Sexual Activity   Alcohol use: Yes    Alcohol/week: 14.0 standard drinks of alcohol    Types: 14 Cans of beer per week   Drug use: No   Sexual activity: Not on file  Other Topics Concern   Not on file  Social History Narrative   Not on file   Social Determinants of Health   Financial Resource Strain: Not on file  Food Insecurity: No Food Insecurity (09/06/2022)   Hunger Vital Sign    Worried About Running Out of Food in the Last Year: Never true    Ran Out of Food in the Last Year: Never true  Transportation Needs: No Transportation Needs (09/06/2022)   PRAPARE - Administrator, Civil Service (Medical): No    Lack of Transportation (Non-Medical): No  Physical Activity: Not on file  Stress: Not on file  Social Connections: Not on file  Intimate Partner Violence: Not on file    FH:  Family History  Problem Relation Age of Onset   Colon cancer Father 58   Colon cancer Sister 55   Stomach cancer Neg Hx     Past Medical History:  Diagnosis Date   Cancer (HCC)  colon cancer 2001, prostate cancer 2011   COPD (chronic obstructive pulmonary disease) (HCC)    Emphysema of lung (HCC)    Hypertension    PTSD (post-traumatic stress disorder)    Seasonal allergies     Current Outpatient Medications  Medication Sig Dispense Refill   albuterol (PROVENTIL HFA) 108 (90 Base) MCG/ACT inhaler Inhale 2 puffs into the lungs. Every 4 to 6 hours as needed for wheezing and shortness of breath     amiodarone (PACERONE) 200 MG tablet Take 1 tablet (200 mg total) by mouth 2 (two) times daily. 180 tablet 3   aspirin EC 81 MG tablet Take 1 tablet (81 mg total) by mouth daily. Swallow whole. 90 tablet 3   atorvastatin (LIPITOR) 20 MG tablet Take 1 tablet (20 mg total) by mouth daily. 30  tablet 0   digoxin (LANOXIN) 0.125 MG tablet Take 0.125 mg by mouth daily.     docusate sodium (COLACE) 100 MG capsule Take 100 mg by mouth daily as needed for constipation.     empagliflozin (JARDIANCE) 10 MG TABS tablet Take 1 tablet (10 mg total) by mouth daily. 90 tablet 3   fluticasone (FLONASE) 50 MCG/ACT nasal spray 1 spray by Nasal route 2 (two) times daily.       gabapentin (NEURONTIN) 400 MG capsule Take 1 capsule (400 mg total) by mouth 3 (three) times daily. 90 capsule 0   HYDROcodone-acetaminophen (NORCO/VICODIN) 5-325 MG tablet Take 1 tablet by mouth every 6 (six) hours as needed for moderate pain. 6 tablet 0   leuprolide, 6 Month, (LEUPROLIDE ACETATE, 6 MONTH,) 45 MG injection Inject 45 mg into the skin. Every 6 months     loratadine (CLARITIN) 10 MG tablet Take 10 mg by mouth daily.     Magnesium Oxide (MAOX) 420 MG TABS Take 400 mg by mouth daily.     methocarbamol (ROBAXIN) 500 MG tablet Take 500 mg by mouth 2 (two) times daily.     sacubitril-valsartan (ENTRESTO) 24-26 MG Take 1 tablet by mouth 2 (two) times daily. 60 tablet 0   sertraline (ZOLOFT) 100 MG tablet Take 100 mg by mouth daily.     spironolactone (ALDACTONE) 25 MG tablet Take 1 tablet (25 mg total) by mouth daily. 90 tablet 3   Tetrahydrozoline HCl (VISINE OP) Place 1 drop into both eyes daily as needed (red eye).     tiotropium (SPIRIVA) 18 MCG inhalation capsule Place 18 mcg into inhaler and inhale daily.     No current facility-administered medications for this encounter.    Vitals:   10/04/22 1030  BP: 108/76  Pulse: 77  SpO2: 97%  Weight: 101.7 kg (224 lb 3.2 oz)   Wt Readings from Last 3 Encounters:  10/04/22 101.7 kg (224 lb 3.2 oz)  09/06/22 101.6 kg (224 lb)  08/22/22 101.2 kg (223 lb)     PHYSICAL EXAM: General:  Well appearing. No resp difficulty. Walked in the clinic.  HEENT: normal Neck: supple. no JVD. Carotids 2+ bilat; no bruits. No lymphadenopathy or thryomegaly appreciated. Cor:  PMI nondisplaced. Regular rate & rhythm. No rubs, gallops or murmurs. Lungs: clear Abdomen: soft, nontender, nondistended. No hepatosplenomegaly. No bruits or masses. Good bowel sounds. Extremities: no cyanosis, clubbing, rash, edema Neuro: alert & orientedx3, cranial nerves grossly intact. moves all 4 extremities w/o difficulty. Affect pleasant  EKG: SR 77 bpm QRS narrow    ASSESSMENT & PLAN:  1.Chronic HFrEF/ICM - new diagnosis 08/23 - required milrinone for support.  - Echo  08/23: EF < 20%, mild to moderate RV dysfunction, moderate to severe functional MR  - R/LHC with minimal non obs CAD and moderately reduced CO on 0.125 milrinone.  - Unable to obtain cMRI due to claustrophobia.  - Etiology unclear. Suspect possible PVC-mediated - NYHA II. Volume status stable. He does not need scheduled diuretics.  - Add coreg 3.125 mg twice a day . Discussed purpose. sent new med to the [place].  - Continue spiro 25 mg daily  - Continue jardiance 10 mg daily  - Dig level >1 , dig was stopped 09/10/22 but he kept taking it. Today I have asked him to stop digoxin.  -  Continue Entresto 24/26 BID. -  Will need repeat echo once on maximally tolerated GDMT.If EF remains low will need referral for ICD. Narrow QRS so no indication for CRT-D   2. Mitral regurgitation - functional - hopefully will improve with treatment of HF - Repeat ECHO once GDMT optimized.    3. COPD - stable quit smoking 2001   4. H/o colon CA s/p partial colectomy 2001 and chemo   5. H/o lung CA s/p partial lobectomy - details unclear   6. OSA - unable to tolerate CPAP    6. PVCs  - On po amio 200 mg daily. Continue  - Zio results pending.  - Failed mexiletine -No PVCs on EKG.   For lab abnormalities/test results/medication changes he would like his wife, Jose Ferguson called.   Follow up 4 weeks with Dr. Haroldine Laws Kire Ferg NP-C  11:07 AM

## 2022-10-04 ENCOUNTER — Other Ambulatory Visit (HOSPITAL_COMMUNITY): Payer: Self-pay

## 2022-10-04 ENCOUNTER — Encounter (HOSPITAL_COMMUNITY): Payer: Self-pay

## 2022-10-04 ENCOUNTER — Ambulatory Visit (HOSPITAL_COMMUNITY)
Admission: RE | Admit: 2022-10-04 | Discharge: 2022-10-04 | Disposition: A | Discharge status: Home / Self Care | Payer: No Typology Code available for payment source | Source: Ambulatory Visit | Attending: Adult Health | Admitting: Adult Health

## 2022-10-04 VITALS — BP 108/76 | HR 77 | Wt 224.2 lb

## 2022-10-04 DIAGNOSIS — I340 Nonrheumatic mitral (valve) insufficiency: Secondary | ICD-10-CM | POA: Insufficient documentation

## 2022-10-04 DIAGNOSIS — I428 Other cardiomyopathies: Secondary | ICD-10-CM | POA: Diagnosis not present

## 2022-10-04 DIAGNOSIS — Z85118 Personal history of other malignant neoplasm of bronchus and lung: Secondary | ICD-10-CM | POA: Diagnosis not present

## 2022-10-04 DIAGNOSIS — I493 Ventricular premature depolarization: Secondary | ICD-10-CM | POA: Insufficient documentation

## 2022-10-04 DIAGNOSIS — G4733 Obstructive sleep apnea (adult) (pediatric): Secondary | ICD-10-CM | POA: Diagnosis not present

## 2022-10-04 DIAGNOSIS — Z87891 Personal history of nicotine dependence: Secondary | ICD-10-CM | POA: Diagnosis not present

## 2022-10-04 DIAGNOSIS — I110 Hypertensive heart disease with heart failure: Secondary | ICD-10-CM | POA: Diagnosis not present

## 2022-10-04 DIAGNOSIS — B1920 Unspecified viral hepatitis C without hepatic coma: Secondary | ICD-10-CM | POA: Diagnosis not present

## 2022-10-04 DIAGNOSIS — J449 Chronic obstructive pulmonary disease, unspecified: Secondary | ICD-10-CM | POA: Diagnosis not present

## 2022-10-04 DIAGNOSIS — Z85038 Personal history of other malignant neoplasm of large intestine: Secondary | ICD-10-CM | POA: Diagnosis not present

## 2022-10-04 DIAGNOSIS — I5022 Chronic systolic (congestive) heart failure: Secondary | ICD-10-CM

## 2022-10-04 DIAGNOSIS — I255 Ischemic cardiomyopathy: Secondary | ICD-10-CM | POA: Diagnosis not present

## 2022-10-04 MED ORDER — CARVEDILOL 3.125 MG PO TABS: 3.1250 mg | ORAL_TABLET | Freq: Two times a day (BID) | ORAL | 11 refills | Status: DC

## 2022-10-04 NOTE — Patient Instructions (Signed)
EKG done today.  No Labs done today.   STOP taking Digoxin  START Carvedilol 3.'125mg'$  (1 tablet) by mouth 2 times daily.   No other medication changes were made. Please continue all current medications as prescribed.  Your physician recommends that you keep your scheduled follow-up appointment.    If you have any questions or concerns before your next appointment please send [place] a message through [place] or call our office at [phone].    TO LEAVE A MESSAGE FOR THE NURSE SELECT OPTION 2, PLEASE LEAVE A MESSAGE INCLUDING: YOUR NAME DATE OF BIRTH CALL BACK NUMBER REASON FOR CALL**this is important as we prioritize the call backs  YOU WILL RECEIVE A CALL BACK THE SAME DAY AS LONG AS YOU CALL BEFORE 4:00 PM   Do the following things EVERYDAY: Weigh yourself in the morning before breakfast. Write it down and keep it in a log. Take your medicines as prescribed Eat low salt foods--Limit salt (sodium) to 2000 mg per day.  Stay as active as you can everyday Limit all fluids for the day to less than 2 liters   At the [facility], you and your health needs are our priority. As part of our continuing mission to provide you with exceptional heart care, we have created designated Provider Care Teams. These Care Teams include your primary Cardiologist (physician) and Advanced Practice Providers (APPs- Physician Assistants and Nurse Practitioners) who all work together to provide you with the care you need, when you need it.   You may see any of the following providers on your designated Care Team at your next follow up: Dr Glori Bickers Dr Haynes Kerns, NP Lyda Jester, [place] Audry Riles, PharmD   Please be sure to bring in all your medications bottles to every appointment.

## 2022-10-22 ENCOUNTER — Ambulatory Visit (HOSPITAL_COMMUNITY)
Admission: RE | Admit: 2022-10-22 | Discharge: 2022-10-22 | Disposition: A | Discharge status: Home / Self Care | Payer: No Typology Code available for payment source | Source: Ambulatory Visit | Attending: Cardiovascular Disease | Admitting: Cardiovascular Disease

## 2022-10-22 DIAGNOSIS — I5022 Chronic systolic (congestive) heart failure: Secondary | ICD-10-CM | POA: Diagnosis not present

## 2022-10-22 DIAGNOSIS — I340 Nonrheumatic mitral (valve) insufficiency: Secondary | ICD-10-CM | POA: Diagnosis not present

## 2022-10-22 DIAGNOSIS — I5040 Unspecified combined systolic (congestive) and diastolic (congestive) heart failure: Secondary | ICD-10-CM | POA: Diagnosis not present

## 2022-10-22 DIAGNOSIS — E785 Hyperlipidemia, unspecified: Secondary | ICD-10-CM | POA: Insufficient documentation

## 2022-10-22 DIAGNOSIS — I110 Hypertensive heart disease with heart failure: Secondary | ICD-10-CM | POA: Insufficient documentation

## 2022-10-22 DIAGNOSIS — G4730 Sleep apnea, unspecified: Secondary | ICD-10-CM | POA: Diagnosis not present

## 2022-10-22 LAB — ECHOCARDIOGRAM COMPLETE
Area-P 1/2: 4.18 cm2
Calc EF: 30.8 %
MV M vel: 4.72 m/s
MV Peak grad: 89.1 mmHg
Radius: 0.3 cm
S' Lateral: 5.8 cm
Single Plane A2C EF: 40.5 %
Single Plane A4C EF: 21.4 %

## 2022-10-22 NOTE — Progress Notes (Signed)
  Echocardiogram 2D Echocardiogram has been performed.  Jose Ferguson 10/22/2022, 10:57 AM

## 2022-11-14 ENCOUNTER — Encounter (HOSPITAL_COMMUNITY): Payer: Self-pay | Admitting: Internal Medicine

## 2022-11-14 ENCOUNTER — Ambulatory Visit (HOSPITAL_COMMUNITY)
Admission: RE | Admit: 2022-11-14 | Discharge: 2022-11-14 | Disposition: A | Discharge status: Home / Self Care | Payer: No Typology Code available for payment source | Source: Ambulatory Visit | Attending: Internal Medicine | Admitting: Internal Medicine

## 2022-11-14 VITALS — BP 104/60 | HR 83 | Wt 229.8 lb

## 2022-11-14 DIAGNOSIS — I493 Ventricular premature depolarization: Secondary | ICD-10-CM | POA: Diagnosis not present

## 2022-11-14 DIAGNOSIS — I110 Hypertensive heart disease with heart failure: Secondary | ICD-10-CM | POA: Diagnosis not present

## 2022-11-14 DIAGNOSIS — I340 Nonrheumatic mitral (valve) insufficiency: Secondary | ICD-10-CM | POA: Insufficient documentation

## 2022-11-14 DIAGNOSIS — Z902 Acquired absence of lung [part of]: Secondary | ICD-10-CM | POA: Diagnosis not present

## 2022-11-14 DIAGNOSIS — Z85038 Personal history of other malignant neoplasm of large intestine: Secondary | ICD-10-CM | POA: Insufficient documentation

## 2022-11-14 DIAGNOSIS — I2510 Atherosclerotic heart disease of native coronary artery without angina pectoris: Secondary | ICD-10-CM | POA: Diagnosis not present

## 2022-11-14 DIAGNOSIS — Z85118 Personal history of other malignant neoplasm of bronchus and lung: Secondary | ICD-10-CM | POA: Insufficient documentation

## 2022-11-14 DIAGNOSIS — G4733 Obstructive sleep apnea (adult) (pediatric): Secondary | ICD-10-CM | POA: Diagnosis not present

## 2022-11-14 DIAGNOSIS — J449 Chronic obstructive pulmonary disease, unspecified: Secondary | ICD-10-CM | POA: Diagnosis not present

## 2022-11-14 DIAGNOSIS — Z9221 Personal history of antineoplastic chemotherapy: Secondary | ICD-10-CM | POA: Diagnosis not present

## 2022-11-14 DIAGNOSIS — Z87891 Personal history of nicotine dependence: Secondary | ICD-10-CM | POA: Diagnosis not present

## 2022-11-14 DIAGNOSIS — I5022 Chronic systolic (congestive) heart failure: Secondary | ICD-10-CM | POA: Insufficient documentation

## 2022-11-14 LAB — COMPREHENSIVE METABOLIC PANEL
ALT: 11 U/L (ref 0–44)
AST: 17 U/L (ref 15–41)
Albumin: 3.3 g/dL — ABNORMAL LOW (ref 3.5–5.0)
Alkaline Phosphatase: 46 U/L (ref 38–126)
Anion gap: 9 (ref 5–15)
BUN: 16 mg/dL (ref 8–23)
CO2: 23 mmol/L (ref 22–32)
Calcium: 9 mg/dL (ref 8.9–10.3)
Chloride: 106 mmol/L (ref 98–111)
Creatinine, Ser: 1.2 mg/dL (ref 0.61–1.24)
GFR, Estimated: >60 mL/min (ref >60)
Glucose, Bld: 112 mg/dL — ABNORMAL HIGH (ref 70–99)
Potassium: 4.6 mmol/L (ref 3.5–5.1)
Sodium: 138 mmol/L (ref 135–145)
Total Bilirubin: 0.3 mg/dL (ref 0.3–1.2)
Total Protein: 6.5 g/dL (ref 6.5–8.1)

## 2022-11-14 LAB — TSH: TSH: 14.49 u[IU]/mL — ABNORMAL HIGH (ref 0.350–4.500)

## 2022-11-14 LAB — T4, FREE: Free T4: 0.75 ng/dL (ref 0.61–1.12)

## 2022-11-14 LAB — BRAIN NATRIURETIC PEPTIDE: B Natriuretic Peptide: 85 pg/mL (ref 0.0–100.0)

## 2022-11-14 MED ORDER — AMIODARONE HCL 200 MG PO TABS: 200.0000 mg | ORAL_TABLET | Freq: Every day | ORAL | 3 refills | Status: DC

## 2022-11-14 MED ORDER — DIAZEPAM 5 MG PO TABS: 5.0000 mg | ORAL_TABLET | Freq: Once | ORAL | 0 refills | Status: AC

## 2022-11-14 NOTE — Patient Instructions (Signed)
Medication Changes:  Decrease Amiodarone to 200 mg Daily  Lab Work:  Labs done today, your results will be available in MyChart, we will contact you for abnormal readings.   Testing/Procedures:  Your physician has requested that you have a cardiac MRI. Cardiac MRI uses a computer to create images of your heart as its beating, producing both still and moving pictures of your heart and major blood vessels. For further information please visit [email or web address] Please follow the instruction sheet given to you today for more information. YOU WILL BE CALLED TO SCHEDULE THIS, we have provided you a prescription for Valium 5 mg 1 tab to take 1 hour before your MRI  Referrals:  none  Special Instructions // Education:  Do the following things EVERYDAY: Weigh yourself in the morning before breakfast. Write it down and keep it in a log. Take your medicines as prescribed Eat low salt foods--Limit salt (sodium) to 2000 mg per day.  Stay as active as you can everyday Limit all fluids for the day to less than 2 liters   Follow-Up in: 3 months  At the [facility], you and your health needs are our priority. We have a designated team specialized in the treatment of Heart Failure. This Care Team includes your primary Heart Failure Specialized Cardiologist (physician), Advanced Practice Providers (APPs- Physician Assistants and Nurse Practitioners), and Pharmacist who all work together to provide you with the care you need, when you need it.   You may see any of the following providers on your designated Care Team at your next follow up:  Dr. Glori Bickers Dr. Loralie Champagne Dr. Roxana Hires, NP Lyda Jester, [place] [facility] [place], [place] Forestine Na, NP Audry Riles, PharmD   Please be sure to bring in all your medications bottles to every appointment.   Need to Contact us:  If you have any questions or concerns before your next  appointment please send [place] a message through [place] or call our office at [phone].    TO LEAVE A MESSAGE FOR THE NURSE SELECT OPTION 2, PLEASE LEAVE A MESSAGE INCLUDING: YOUR NAME DATE OF BIRTH CALL BACK NUMBER REASON FOR CALL**this is important as we prioritize the call backs  YOU WILL RECEIVE A CALL BACK THE SAME DAY AS LONG AS YOU CALL BEFORE 4:00 PM

## 2022-11-14 NOTE — Progress Notes (Signed)
Advanced Heart Failure Team Clinic Note   PCP: Thayer [facility] Primary Cardiologist: Dr. Haroldine Laws  HPI: Mr Lafon isa 67 y/o male. Civil Service fast streamer with h/o recently diagnosed systolic CHF, COPD, OSA unable to tolerate CPAP, HTN, HCV, colon CA with met to lung 2001 s/p chemo with colon/lung resection. Smoked for a long time but quit in 2001 when he was found to have colon/lung/prostate CA.   Admitted to [facility] on 07/28/22 with acute systolic CHF with cardiogenic shock. BNP up to 1204. Echo EF < 20% RV mild to moderately down. Moderate to severe functional MR.  Required inotrope support with milrinone and diuresed with IV lasix. GDMT titrated. R/LHC on 0.125 milrinone with nonobstructive CAD, RA 4, PA mean 23, PCWP 9, Fick CO/CI 4.7/2.1, PA sat 57%/58%. 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 Mexiletine initially started but later switched to amiodarone d/t ineffectiveness. Unable to obtain cMRI d/t claustrophobia.  Echo 11/23 EF 25% RV mildly down Mild MR  Today he returns for HF follow up with his wife. Last visit dig was stopped due to elevated level and switched to entresto. Feels good. Denies CP, SOB, orthopnea or PND. Went to [place] and was told that thyroid was high. Gets dizzy at times when he stands up  Cardiac Testing Echo 07/2022 EF < 20% RV mild - mod down and mod-severe functional MR  Cath 07/2022  Nonobstructive CAD  RA 4, PA mean 23, PCWP 9, Fick CO/CI 4.7/2.1, PA sat 57%/58%  ROS: All systems negative except as listed in HPI, PMH and Problem List.  SH:  Social History   Socioeconomic History   Marital status: Divorced    Spouse name: Not on file   Number of children: Not on file   Years of education: Not on file   Highest education level: Not on file  Occupational History   Not on file  Tobacco Use   Smoking status: Former    Types: Cigarettes    Quit date: 10/23/2009    Years since quitting: 13.0   Smokeless tobacco: Former    Quit date: 10/23/2009  Vaping Use   Vaping Use:  Never used  Substance and Sexual Activity   Alcohol use: Yes    Alcohol/week: 14.0 standard drinks of alcohol    Types: 14 Cans of beer per week   Drug use: No   Sexual activity: Not on file  Other Topics Concern   Not on file  Social History Narrative   Not on file   Social Determinants of Health   Financial Resource Strain: Not on file  Food Insecurity: No Food Insecurity (09/06/2022)   Hunger Vital Sign    Worried About Running Out of Food in the Last Year: Never true    Ran Out of Food in the Last Year: Never true  Transportation Needs: No Transportation Needs (09/06/2022)   PRAPARE - Hydrologist (Medical): No    Lack of Transportation (Non-Medical): No  Physical Activity: Not on file  Stress: Not on file  Social Connections: Not on file  Intimate Partner Violence: Not on file    FH:  Family History  Problem Relation Age of Onset   Colon cancer Father 7   Colon cancer Sister 43   Stomach cancer Neg Hx     Past Medical History:  Diagnosis Date   Cancer ([place])    colon cancer 2001, prostate cancer 2011   COPD (chronic obstructive pulmonary disease) ([place])    Emphysema of lung ([place])  Hypertension    PTSD (post-traumatic stress disorder)    Seasonal allergies     Current Outpatient Medications  Medication Sig Dispense Refill   albuterol (PROVENTIL HFA) 108 (90 Base) MCG/ACT inhaler Inhale 2 puffs into the lungs. Every 4 to 6 hours as needed for wheezing and shortness of breath     amiodarone (PACERONE) 200 MG tablet Take 1 tablet (200 mg total) by mouth 2 (two) times daily. 180 tablet 3   aspirin EC 81 MG tablet Take 1 tablet (81 mg total) by mouth daily. Swallow whole. 90 tablet 3   atorvastatin (LIPITOR) 20 MG tablet Take 1 tablet (20 mg total) by mouth daily. 30 tablet 0   carvedilol (COREG) 3.125 MG tablet Take 1 tablet (3.125 mg total) by mouth 2 (two) times daily. 60 tablet 11   empagliflozin (JARDIANCE) 10 MG TABS tablet Take 1  tablet (10 mg total) by mouth daily. 90 tablet 3   fluticasone (FLONASE) 50 MCG/ACT nasal spray 1 spray by Nasal route 2 (two) times daily.       gabapentin (NEURONTIN) 400 MG capsule Take 1 capsule (400 mg total) by mouth 3 (three) times daily. 90 capsule 0   HYDROcodone-acetaminophen (NORCO/VICODIN) 5-325 MG tablet Take 1 tablet by mouth every 6 (six) hours as needed for moderate pain. 6 tablet 0   leuprolide, 6 Month, (LEUPROLIDE ACETATE, 6 MONTH,) 45 MG injection Inject 45 mg into the skin. Every 6 months     loratadine (CLARITIN) 10 MG tablet Take 10 mg by mouth daily.     Magnesium Oxide (MAOX) 420 MG TABS Take 400 mg by mouth daily.     methocarbamol (ROBAXIN) 500 MG tablet Take 500 mg by mouth 2 (two) times daily.     sacubitril-valsartan (ENTRESTO) 24-26 MG Take 1 tablet by mouth 2 (two) times daily. 60 tablet 0   sertraline (ZOLOFT) 100 MG tablet Take 100 mg by mouth daily.     spironolactone (ALDACTONE) 25 MG tablet Take 1 tablet (25 mg total) by mouth daily. 90 tablet 3   Tetrahydrozoline HCl (VISINE OP) Place 1 drop into both eyes daily as needed (red eye).     tiotropium (SPIRIVA) 18 MCG inhalation capsule Place 18 mcg into inhaler and inhale daily.     No current facility-administered medications for this encounter.    Vitals:   11/14/22 0855  BP: 104/60  Pulse: 83  SpO2: 96%  Weight: 104.2 kg (229 lb 12.8 oz)   Wt Readings from Last 3 Encounters:  11/14/22 104.2 kg (229 lb 12.8 oz)  10/04/22 101.7 kg (224 lb 3.2 oz)  09/06/22 101.6 kg (224 lb)     PHYSICAL EXAM: General:  Well appearing. No resp difficulty HEENT: normal Neck: supple. no JVD. Carotids 2+ bilat; no bruits. No lymphadenopathy or thryomegaly appreciated. Cor: PMI nondisplaced. Regular rate & rhythm. No rubs, gallops or murmurs. Lungs: clear Abdomen: obese soft, nontender, nondistended. No hepatosplenomegaly. No bruits or masses. Good bowel sounds. Extremities: no cyanosis, clubbing, rash,  edema Neuro: alert & orientedx3, cranial nerves grossly intact. moves all 4 extremities w/o difficulty. Affect pleasant    ASSESSMENT & PLAN:   1.Chronic HFrEF/ICM - new diagnosis 08/23 - required milrinone for support.  - Echo 08/23: EF < 20%, mild to moderate RV dysfunction, moderate to severe functional MR  - R/LHC with minimal non obs CAD and moderately reduced CO on 0.125 milrinone.  - Unable to obtain cMRI due to claustrophobia. He is willing to retry with valium  for sedation  - Etiology unclear. Suspect possible PVC-mediated - Echo 11/23 EF 25% - NYHA II. Volume status stable. He does not need scheduled diuretics.  - Continue coreg 3.125 mg twice a day . - Continue spiro 25 mg daily  - Continue jardiance 10 mg daily  -  Continue Entresto 24/26 BID. -  Zio < 1% PVCs 10/23 - BP too low to titrate GDMT. Continue current regimen   2. Mitral regurgitation - functional - hopefully will improve with treatment of HF - Now mild on echo 11/23   3. COPD - stable quit smoking 2001   4. H/o colon CA s/p partial colectomy 2001 and chemo   5. H/o lung CA s/p partial lobectomy - details unclear   6. OSA - unable to tolerate CPAP  - I encouraged him to work on this. Going to try nasal prongs   6. PVCs  - On po amio 200 mg bid  - Zio 10/23  < 1% PVCs - Failed mexiletine - Decrease amio to 200 daily  7. Elevate thyroid panels - recheck today    Glori Bickers MD 9:15 AM

## 2022-11-14 NOTE — Addendum Note (Signed)
Encounter addended by: Scarlette Calico, RN on: 11/14/2022 9:45 AM  Actions taken: Order list changed, Diagnosis association updated, Pend clinical note, Clinical Note Signed, Charge Capture section accepted

## 2022-11-17 LAB — T3, FREE: T3, Free: 2.7 pg/mL (ref 2.0–4.4)

## 2023-02-12 ENCOUNTER — Telehealth (HOSPITAL_COMMUNITY): Payer: Self-pay | Admitting: Emergency Medicine

## 2023-02-12 NOTE — Telephone Encounter (Signed)
Unable to leave vm Brittiney Dicostanzo RN Navigator Cardiac Imaging [facility] [phone] Office  [phone] Cell  

## 2023-02-13 ENCOUNTER — Other Ambulatory Visit (HOSPITAL_COMMUNITY): Payer: Self-pay | Admitting: Internal Medicine

## 2023-02-13 ENCOUNTER — Ambulatory Visit (HOSPITAL_COMMUNITY)
Admission: RE | Admit: 2023-02-13 | Discharge: 2023-02-13 | Disposition: A | Discharge status: Home / Self Care | Payer: No Typology Code available for payment source | Source: Ambulatory Visit | Attending: Internal Medicine | Admitting: Internal Medicine

## 2023-02-13 DIAGNOSIS — I5022 Chronic systolic (congestive) heart failure: Secondary | ICD-10-CM

## 2023-02-13 MED ORDER — GADOBUTROL 1 MMOL/ML IV SOLN
10.0000 mL | Freq: Once | INTRAVENOUS | Status: AC | PRN
  Administered 2023-02-13: 10 mL via INTRAVENOUS

## 2023-02-22 ENCOUNTER — Ambulatory Visit (HOSPITAL_COMMUNITY)
Admission: RE | Admit: 2023-02-22 | Discharge: 2023-02-22 | Disposition: A | Discharge status: Home / Self Care | Payer: No Typology Code available for payment source | Source: Ambulatory Visit | Attending: Internal Medicine | Admitting: Internal Medicine

## 2023-02-22 ENCOUNTER — Encounter (HOSPITAL_COMMUNITY): Payer: Self-pay | Admitting: Internal Medicine

## 2023-02-22 ENCOUNTER — Encounter (HOSPITAL_COMMUNITY): Payer: No Typology Code available for payment source | Admitting: Internal Medicine

## 2023-02-22 VITALS — BP 100/60 | HR 102 | Wt 240.8 lb

## 2023-02-22 DIAGNOSIS — I2510 Atherosclerotic heart disease of native coronary artery without angina pectoris: Secondary | ICD-10-CM | POA: Insufficient documentation

## 2023-02-22 DIAGNOSIS — I493 Ventricular premature depolarization: Secondary | ICD-10-CM | POA: Diagnosis not present

## 2023-02-22 DIAGNOSIS — Z8546 Personal history of malignant neoplasm of prostate: Secondary | ICD-10-CM | POA: Insufficient documentation

## 2023-02-22 DIAGNOSIS — I110 Hypertensive heart disease with heart failure: Secondary | ICD-10-CM | POA: Diagnosis not present

## 2023-02-22 DIAGNOSIS — Z85038 Personal history of other malignant neoplasm of large intestine: Secondary | ICD-10-CM | POA: Diagnosis not present

## 2023-02-22 DIAGNOSIS — R946 Abnormal results of thyroid function studies: Secondary | ICD-10-CM | POA: Diagnosis not present

## 2023-02-22 DIAGNOSIS — G4733 Obstructive sleep apnea (adult) (pediatric): Secondary | ICD-10-CM

## 2023-02-22 DIAGNOSIS — Z9221 Personal history of antineoplastic chemotherapy: Secondary | ICD-10-CM | POA: Diagnosis not present

## 2023-02-22 DIAGNOSIS — Z87891 Personal history of nicotine dependence: Secondary | ICD-10-CM | POA: Diagnosis not present

## 2023-02-22 DIAGNOSIS — I5022 Chronic systolic (congestive) heart failure: Secondary | ICD-10-CM

## 2023-02-22 DIAGNOSIS — J449 Chronic obstructive pulmonary disease, unspecified: Secondary | ICD-10-CM | POA: Insufficient documentation

## 2023-02-22 DIAGNOSIS — I340 Nonrheumatic mitral (valve) insufficiency: Secondary | ICD-10-CM

## 2023-02-22 DIAGNOSIS — Z79899 Other long term (current) drug therapy: Secondary | ICD-10-CM | POA: Insufficient documentation

## 2023-02-22 DIAGNOSIS — Z85118 Personal history of other malignant neoplasm of bronchus and lung: Secondary | ICD-10-CM | POA: Insufficient documentation

## 2023-02-22 LAB — COMPREHENSIVE METABOLIC PANEL
ALT: 14 U/L (ref 0–44)
AST: 21 U/L (ref 15–41)
Albumin: 3.5 g/dL (ref 3.5–5.0)
Alkaline Phosphatase: 46 U/L (ref 38–126)
Anion gap: 16 — ABNORMAL HIGH (ref 5–15)
BUN: 17 mg/dL (ref 8–23)
CO2: 18 mmol/L — ABNORMAL LOW (ref 22–32)
Calcium: 9 mg/dL (ref 8.9–10.3)
Chloride: 105 mmol/L (ref 98–111)
Creatinine, Ser: 1.18 mg/dL (ref 0.61–1.24)
GFR, Estimated: >60 mL/min (ref >60)
Glucose, Bld: 142 mg/dL — ABNORMAL HIGH (ref 70–99)
Potassium: 3.9 mmol/L (ref 3.5–5.1)
Sodium: 139 mmol/L (ref 135–145)
Total Bilirubin: 0.5 mg/dL (ref 0.3–1.2)
Total Protein: 6.5 g/dL (ref 6.5–8.1)

## 2023-02-22 LAB — T4, FREE: Free T4: 0.77 ng/dL (ref 0.61–1.12)

## 2023-02-22 LAB — BRAIN NATRIURETIC PEPTIDE: B Natriuretic Peptide: 45.4 pg/mL (ref 0.0–100.0)

## 2023-02-22 LAB — TSH: TSH: 10.813 u[IU]/mL — ABNORMAL HIGH (ref 0.350–4.500)

## 2023-02-22 MED ORDER — AMIODARONE HCL 200 MG PO TABS: 100.0000 mg | ORAL_TABLET | Freq: Every day | ORAL | 3 refills | Status: AC

## 2023-02-22 MED ORDER — CARVEDILOL 6.25 MG PO TABS: 6.2500 mg | ORAL_TABLET | Freq: Two times a day (BID) | ORAL | 3 refills | Status: DC

## 2023-02-22 NOTE — Patient Instructions (Signed)
INCREASE Carvedilol to 6.25 mg Twice daily  DECREASE Amiodarone to '100mg'$  daily.  Labs done today, your results will be available in MyChart, we will contact you for abnormal readings.  Your physician recommends that you schedule a follow-up appointment in: 3 months  If you have any questions or concerns before your next appointment please send [place] a message through [place] or call our office at [phone].    TO LEAVE A MESSAGE FOR THE NURSE SELECT OPTION 2, PLEASE LEAVE A MESSAGE INCLUDING: YOUR NAME DATE OF BIRTH CALL BACK NUMBER REASON FOR CALL**this is important as we prioritize the call backs  YOU WILL RECEIVE A CALL BACK THE SAME DAY AS LONG AS YOU CALL BEFORE 4:00 PM  At the [facility], you and your health needs are our priority. As part of our continuing mission to provide you with exceptional heart care, we have created designated Provider Care Teams. These Care Teams include your primary Cardiologist (physician) and Advanced Practice Providers (APPs- Physician Assistants and Nurse Practitioners) who all work together to provide you with the care you need, when you need it.   You may see any of the following providers on your designated Care Team at your next follow up: Dr Glori Bickers Dr Loralie Champagne Dr. Roxana Hires, NP Lyda Jester, [place] [facility] [place], [place] Forestine Na, NP Audry Riles, PharmD   Please be sure to bring in all your medications bottles to every appointment.    Thank you for choosing [facility]

## 2023-02-22 NOTE — Progress Notes (Signed)
Advanced Heart Failure Team Clinic Note   PCP: [facility] Primary Cardiologist: Dr. Haroldine Laws  HPI: Jose Ferguson isa 67 y/o male. [employer] fast streamer with systolic CHF, COPD, OSA unable to tolerate CPAP, HTN, HCV, colon CA with met to lung 2001 s/p chemo with colon/lung resection. Smoked for a long time but quit in 2001 when he was found to have colon/lung/prostate CA.   Admitted 8/23 with acute systolic CHF with cardiogenic shock. BNP up to 1204. Echo EF < 20% RV mild to moderately down. Moderate to severe functional Jose.  Required inotrope support with milrinone and diuresed with IV lasix. GDMT titrated. R/LHC on 0.125 milrinone with nonobstructive CAD, RA 4, PA mean 23, PCWP 9, Fick CO/CI 4.7/2.1, PA sat 57%/58%. 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 Mexiletine initially started but later switched to amiodarone d/t ineffectiveness. Unable to obtain cMRI d/t claustrophobia.  Echo 11/23 EF 25% RV mildly down Mild Jose  Today he returns for HF follow up. Feels ok. Walks with his cane. Denies CP or undue SOB. No edema, orthopnea or PND. Gets SOB if goes too fast. Compliant with meds.   Cardiac Testing Echo 07/2022 EF < 20% RV mild - mod down and mod-severe functional Jose  Cath 07/2022  Nonobstructive CAD  RA 4, PA mean 23, PCWP 9, Fick CO/CI 4.7/2.1, PA sat 57%/58%  ROS: All systems negative except as listed in HPI, PMH and Problem List.  SH:  Social History   Socioeconomic History   Marital status: Married    Spouse name: Not on file   Number of children: Not on file   Years of education: Not on file   Highest education level: Not on file  Occupational History   Not on file  Tobacco Use   Smoking status: Former    Types: Cigarettes    Quit date: 10/23/2009    Years since quitting: 13.3   Smokeless tobacco: Former    Quit date: 10/23/2009  Vaping Use   Vaping Use: Never used  Substance and Sexual Activity   Alcohol use: Yes    Alcohol/week: 14.0 standard drinks of alcohol    Types:  14 Cans of beer per week   Drug use: No   Sexual activity: Not on file  Other Topics Concern   Not on file  Social History Narrative   Not on file   Social Determinants of Health   Financial Resource Strain: Not on file  Food Insecurity: No Food Insecurity (09/06/2022)   Hunger Vital Sign    Worried About Running Out of Food in the Last Year: Never true    Ran Out of Food in the Last Year: Never true  Transportation Needs: No Transportation Needs (09/06/2022)   PRAPARE - Hydrologist (Medical): No    Lack of Transportation (Non-Medical): No  Physical Activity: Not on file  Stress: Not on file  Social Connections: Not on file  Intimate Partner Violence: Not on file    FH:  Family History  Problem Relation Age of Onset   Colon cancer Father 11   Colon cancer Sister 61   Stomach cancer Neg Hx     Past Medical History:  Diagnosis Date   Cancer ([place])    colon cancer 2001, prostate cancer 2011   COPD (chronic obstructive pulmonary disease) (H[facility])    Emphysema of lung (H[facility])    Hypertension    PTSD (post-traumatic stress disorder)    Seasonal allergies     Current Outpatient Medications  Medication Sig Dispense Refill   albuterol (PROVENTIL HFA) 108 (90 Base) MCG/ACT inhaler Inhale 2 puffs into the lungs. Every 4 to 6 hours as needed for wheezing and shortness of breath     amiodarone (PACERONE) 200 MG tablet Take 1 tablet (200 mg total) by mouth daily. 180 tablet 3   aspirin EC 81 MG tablet Take 1 tablet (81 mg total) by mouth daily. Swallow whole. 90 tablet 3   atorvastatin (LIPITOR) 20 MG tablet Take 1 tablet (20 mg total) by mouth daily. 30 tablet 0   carvedilol (COREG) 3.125 MG tablet Take 1 tablet (3.125 mg total) by mouth 2 (two) times daily. 60 tablet 11   empagliflozin (JARDIANCE) 10 MG TABS tablet Take 1 tablet (10 mg total) by mouth daily. 90 tablet 3   fluticasone (FLONASE) 50 MCG/ACT nasal spray 1 spray by Nasal route 2 (two) times  daily.       gabapentin (NEURONTIN) 400 MG capsule Take 1 capsule (400 mg total) by mouth 3 (three) times daily. 90 capsule 0   HYDROcodone-acetaminophen (NORCO/VICODIN) 5-325 MG tablet Take 1 tablet by mouth every 6 (six) hours as needed for moderate pain. 6 tablet 0   leuprolide, 6 Month, (LEUPROLIDE ACETATE, 6 MONTH,) 45 MG injection Inject 45 mg into the skin. Every 6 months     loratadine (CLARITIN) 10 MG tablet Take 10 mg by mouth daily.     Magnesium Oxide (MAOX) 420 MG TABS Take 400 mg by mouth daily.     methocarbamol (ROBAXIN) 500 MG tablet Take 500 mg by mouth 2 (two) times daily.     sacubitril-valsartan (ENTRESTO) 24-26 MG Take 1 tablet by mouth 2 (two) times daily. 60 tablet 0   sertraline (ZOLOFT) 100 MG tablet Take 100 mg by mouth daily.     spironolactone (ALDACTONE) 25 MG tablet Take 1 tablet (25 mg total) by mouth daily. 90 tablet 3   Tetrahydrozoline HCl (VISINE OP) Place 1 drop into both eyes daily as needed (red eye).     tiotropium (SPIRIVA) 18 MCG inhalation capsule Place 18 mcg into inhaler and inhale daily.     No current facility-administered medications for this encounter.    Vitals:   02/22/23 1348  BP: 100/60  Pulse: (!) 102  SpO2: 97%  Weight: 109.2 kg (240 lb 12.8 oz)   Wt Readings from Last 3 Encounters:  02/22/23 109.2 kg (240 lb 12.8 oz)  11/14/22 104.2 kg (229 lb 12.8 oz)  10/04/22 101.7 kg (224 lb 3.2 oz)     PHYSICAL EXAM: General:  Well appearing. No resp difficulty HEENT: normal Neck: supple. no JVD. Carotids 2+ bilat; no bruits. No lymphadenopathy or thryomegaly appreciated. Cor: PMI nondisplaced. Regular r,ildly tachy No rubs, gallops or murmurs. Lungs: clear Abdomen: soft, nontender, nondistended. No hepatosplenomegaly. No bruits or masses. Good bowel sounds. Extremities: no cyanosis, clubbing, rash, edema Neuro: alert & orientedx3, cranial nerves grossly intact. moves all 4 extremities w/o difficulty. Affect pleasant   ECG: Sinus  tachy 104  QRS 94 ms No ST-T wave abnormalities. Personally reviewed   ASSESSMENT & PLAN:   1.Chronic HFrEF/ICM - new diagnosis 08/23 - required milrinone for support.  - Echo 08/23: EF < 20%, mild to moderate RV dysfunction, moderate to severe functional Jose  - R/LHC with minimal non obs CAD and moderately reduced CO on 0.125 milrinone. - Echo 11/23 EF 25% - cMRI 2/24 EF 27% RV 34% minimal LGE - Etiology unclear. Suspect possible PVC-mediated - NYHA II. Volume  status ok.  - Increase carvedilol to 6.25 bid - Continue spiro 25 mg daily  - Continue jardiance 10 mg daily  -  Continue Entresto 24/26 BID. -  Zio < 1% PVCs 10/23 - EF seems to be improving slowly with GDMT and PVC suppression. Would continue current course. If EF not improving further will need ICD   2. Mitral regurgitation - Now mild on echo 11/23   3. COPD - stable quit smoking 2001   4. H/o colon CA s/p partial colectomy 2001 and chemo   5. H/o lung CA s/p partial lobectomy - details unclear   6. OSA - unable to tolerate CPAP  - I encouraged him to work on this. Going to try nasal prongs   6. PVCs  - On po amio 200 mg daily - Zio 10/23  < 1% PVCs - Failed mexiletine - Decrease amio to 100 daily  7. Elevate thyroid panels - recheck today   Glori Bickers MD 2:30 PM

## 2023-02-23 LAB — T3, FREE: T3, Free: 2.8 pg/mL (ref 2.0–4.4)

## 2023-03-07 ENCOUNTER — Telehealth (HOSPITAL_COMMUNITY): Payer: Self-pay

## 2023-03-07 DIAGNOSIS — I5022 Chronic systolic (congestive) heart failure: Secondary | ICD-10-CM

## 2023-03-07 NOTE — Telephone Encounter (Signed)
-----   Message from Jolaine Artist, MD sent at 03/07/2023  3:02 PM EDT ----- Possible mild hypothyroidism. Please repeat TSH, FT4 and FT3 in 4 weeks

## 2023-03-07 NOTE — Telephone Encounter (Signed)
Patients wife advised and verbalized understanding,lab appointment scheduled,lab orders    Orders Placed This Encounter  Procedures   TSH    Standing Status:   Future    Standing Expiration Date:   03/06/2024    Order Specific Question:   Release to patient    Answer:   Immediate    Order Specific Question:   Release to patient    Answer:   Immediate [1]   T4, free    Standing Status:   Future    Standing Expiration Date:   03/06/2024    Order Specific Question:   Release to patient    Answer:   Immediate [1]   T3, free    Standing Status:   Future    Standing Expiration Date:   03/06/2024    Order Specific Question:   Release to patient    Answer:   Immediate [1]

## 2023-03-28 ENCOUNTER — Telehealth (HOSPITAL_COMMUNITY): Payer: Self-pay | Admitting: Cardiology

## 2023-03-28 MED ORDER — FUROSEMIDE 20 MG PO TABS: ORAL_TABLET | ORAL | 11 refills | Status: DC

## 2023-03-28 MED ORDER — POTASSIUM CHLORIDE CRYS ER 20 MEQ PO TBCR: EXTENDED_RELEASE_TABLET | ORAL | 3 refills | Status: DC

## 2023-03-28 NOTE — Telephone Encounter (Signed)
Dr Robb Matar called to speak with Dr Gala Romney  Per conversation pts weight is up x 20 lbs  Per VO Dr Gala Romney Start lasix 40 x 2 days with 20 meq of potassium, repeat every Monday and friday and follow up   Pt aware Requested printed scripts to bring to [facility] pharmacy

## 2023-04-08 ENCOUNTER — Ambulatory Visit (HOSPITAL_COMMUNITY)
Admission: RE | Admit: 2023-04-08 | Discharge: 2023-04-08 | Disposition: A | Discharge status: Home / Self Care | Payer: No Typology Code available for payment source | Source: Ambulatory Visit | Attending: Cardiology | Admitting: Cardiology

## 2023-04-08 DIAGNOSIS — I5022 Chronic systolic (congestive) heart failure: Secondary | ICD-10-CM | POA: Insufficient documentation

## 2023-04-08 LAB — TSH: TSH: 8.44 u[IU]/mL — ABNORMAL HIGH (ref 0.350–4.500)

## 2023-04-09 LAB — T3, FREE: T3, Free: 2.8 pg/mL (ref 2.0–4.4)

## 2023-04-16 NOTE — Progress Notes (Signed)
Advanced Heart Failure Team Clinic Note   PCP: [facility] Primary Cardiologist: Dr. 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 Smoked for a long time but quit in 2001 when he was found to have colon/lung/prostate CA.   Admitted 8/23 with acute systolic CHF with cardiogenic shock. BNP up to 1204. Echo EF < 20% RV mild to moderately down. Moderate to severe functional MR.  Required inotrope support with milrinone and diuresed with IV lasix. GDMT titrated. R/LHC on 0.125 milrinone with nonobstructive CAD, RA 4, PA mean 23, PCWP 9, Fick CO/CI 4.7/2.1, PA sat 57%/58%. 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 Mexiletine initially started but later switched to amiodarone d/t ineffectiveness.   - Echo 11/23 EF 25% RV mildly down Mild MR - cMRI 2/24: EF 27%, GHK, normal RV size EF 34%  Today he returns for AHF follow up. Overall feeling fine. Dizzy every now and then, happens with rapid movements, quickly goes away. Denies palpitations, CP, edema, or PND/Orthopnea. Intt SOB, happens randomly with ambulation. Has tempur-pedic bed at home, has been sleeping at an incline since around July of last year.  Constantly feels fatigued but forces himself to do things around the house. Appetite ok. No fever or chills. Weight at home 210-220 lbs at home (appears to be scale discrepancy between home scale and clinic scale, provided new scale). Taking all medications.  Has not been taking lasix, wife checked at home.    Cardiac Testing cMRI 2/24: EF 27%, GHK, normal RV size EF 34% Echo 07/2022 EF < 20% RV mild - mod down and mod-severe functional MR Cath 07/2022  Nonobstructive CAD  RA 4, PA mean 23, PCWP 9, Fick CO/CI 4.7/2.1, PA sat 57%/58%  ROS: All systems negative except as listed in HPI, PMH and Problem List.  SH:  Social History   Socioeconomic History    Marital status: Married    Spouse name: Not on file   Number of children: Not on file   Years of education: Not on file   Highest education level: Not on file  Occupational History   Not on file  Tobacco Use   Smoking status: Former    Types: Cigarettes    Quit date: 10/23/2009    Years since quitting: 13.4   Smokeless tobacco: Former    Quit date: 10/23/2009  Vaping Use   Vaping Use: Never used  Substance and Sexual Activity   Alcohol use: Yes    Alcohol/week: 14.0 standard drinks of alcohol    Types: 14 Cans of beer per week   Drug use: No   Sexual activity: Not on file  Other Topics Concern   Not on file  Social History Narrative   Not on file   Social Determinants of Health   Financial Resource Strain: Not on file  Food Insecurity: No Food Insecurity (09/06/2022)   Hunger Vital Sign    Worried About Running Out of Food in the Last Year: Never true    Ran Out of Food in the Last Year: Never true  Transportation Needs: No Transportation Needs (09/06/2022)   PRAPARE - Administrator, Civil Service (Medical): No    Lack of Transportation (Non-Medical): No  Physical Activity: Not on file  Stress: Not on file  Social Connections: Not on file  Intimate Partner Violence: Not on file  FH:  Family History  Problem Relation Age of Onset   Colon cancer Father 48   Colon cancer Sister 10   Stomach cancer Neg Hx     Past Medical History:  Diagnosis Date   Cancer ([facility])    colon cancer 2001, prostate cancer 2011   COPD (chronic obstructive pulmonary disease) (HCC)    Emphysema of lung (HCC)    Hypertension    PTSD (post-traumatic stress disorder)    Seasonal allergies     Current Outpatient Medications  Medication Sig Dispense Refill   albuterol (PROVENTIL HFA) 108 (90 Base) MCG/ACT inhaler Inhale 2 puffs into the lungs. Every 4 to 6 hours as needed for wheezing and shortness of breath     amiodarone (PACERONE) 200 MG tablet Take 0.5 tablets (100 mg  total) by mouth daily. 180 tablet 3   aspirin EC 81 MG tablet Take 1 tablet (81 mg total) by mouth daily. Swallow whole. 90 tablet 3   atorvastatin (LIPITOR) 20 MG tablet Take 1 tablet (20 mg total) by mouth daily. 30 tablet 0   carvedilol (COREG) 6.25 MG tablet Take 1 tablet (6.25 mg total) by mouth 2 (two) times daily. 180 tablet 3   empagliflozin (JARDIANCE) 10 MG TABS tablet Take 1 tablet (10 mg total) by mouth daily. 90 tablet 3   fluticasone (FLONASE) 50 MCG/ACT nasal spray 1 spray by Nasal route 2 (two) times daily.       furosemide (LASIX) 20 MG tablet Take 20 mg by mouth daily.     gabapentin (NEURONTIN) 400 MG capsule Take 1 capsule (400 mg total) by mouth 3 (three) times daily. (Patient taking differently: Take 400 mg by mouth 3 (three) times daily. As needed) 90 capsule 0   HYDROcodone-acetaminophen (NORCO/VICODIN) 5-325 MG tablet Take 1 tablet by mouth every 6 (six) hours as needed for moderate pain. 6 tablet 0   leuprolide, 6 Month, (LEUPROLIDE ACETATE, 6 MONTH,) 45 MG injection Inject 45 mg into the skin. Every 6 months     loratadine (CLARITIN) 10 MG tablet Take 10 mg by mouth daily.     Magnesium Oxide (MAOX) 420 MG TABS Take 400 mg by mouth daily.     methocarbamol (ROBAXIN) 500 MG tablet Take 500 mg by mouth 2 (two) times daily.     Potassium Chloride (KLOR-CON PO) Take 20 mEq by mouth daily.     sacubitril-valsartan (ENTRESTO) 24-26 MG Take 1 tablet by mouth 2 (two) times daily. 60 tablet 0   sertraline (ZOLOFT) 100 MG tablet Take 100 mg by mouth daily.     spironolactone (ALDACTONE) 25 MG tablet Take 1 tablet (25 mg total) by mouth daily. 90 tablet 3   Tetrahydrozoline HCl (VISINE OP) Place 1 drop into both eyes daily as needed (red eye).     tiotropium (SPIRIVA) 18 MCG inhalation capsule Place 18 mcg into inhaler and inhale daily.     No current facility-administered medications for this encounter.    Vitals:   04/18/23 0929  BP: 104/74  Pulse: 95  SpO2: 98%  Weight:  109.8 kg (242 lb)    Wt Readings from Last 3 Encounters:  04/18/23 109.8 kg (242 lb)  02/22/23 109.2 kg (240 lb 12.8 oz)  11/14/22 104.2 kg (229 lb 12.8 oz)    PHYSICAL EXAM: General:  well appearing.  No respiratory difficulty. Walked into clinic HEENT: normal Neck: supple. JVD flat. Carotids 2+ bilat; no bruits. No lymphadenopathy or thyromegaly appreciated. Cor: PMI nondisplaced. Regular rate &  rhythm. No rubs, gallops or murmurs. Lungs: R side diminished (s/p partial lobectomy), L side clear Abdomen: soft, nontender, nondistended. No hepatosplenomegaly. No bruits or masses. Good bowel sounds. Extremities: no cyanosis, clubbing, rash, trace BLE edema  Neuro: alert & oriented x 3, cranial nerves grossly intact. moves all 4 extremities w/o difficulty. Affect pleasant.   ECG: none checked today  ReDs: 34%  ASSESSMENT & PLAN:  1.Chronic HFrEF/ICM - new diagnosis 08/23 - required milrinone for support. Etiology unclear. Suspect possible PVC-mediated - Echo 08/23: EF < 20%, mild to moderate RV dysfunction, moderate to severe functional MR  - R/LHC with minimal non obs CAD and moderately reduced CO on 0.125 milrinone. - Echo 11/23 EF 25% - cMRI 2/24 EF 27% RV 34% minimal LGE - Zio < 1% PVCs 10/23 - NYHA II- early IIIa. Volume status stable, ReDs clip 34%. - Start lasix 20 mg daily, had not been previously taking at home - Continue carvedilol 6.25 bid, will not titrate today with fatigue - Continue spiro 25 mg daily  - Continue jardiance 10 mg daily, no GU symptoms - Continue Entresto 24/26 BID, BP too soft to titrate - EF seems to be improving slowly with GDMT and PVC suppression. Would continue current course (per Dr. Gala Romney). If EF not improving further will need ICD.    2. Mitral regurgitation - Now mild on echo 11/23   3. COPD - stable quit smoking 2001   4. H/o colon CA s/p partial colectomy 2001 and chemo   5. H/o lung CA s/p partial lobectomy - details unclear    6. OSA - unable to tolerate CPAP  - Tried nasal prongs, failed 2/2 claustrophobia   7. PVCs  - On po amio 100 mg daily - Zio 10/23  < 1% PVCs - Failed mexiletine  8. Elevate thyroid panels - mild hypothyroidism on recent repeat thyroid labs - monitor  Follow up 4-6 weeks with APP. Repeat echo next visit.  Alen Bleacher AGACNP-BC  Advanced Heart Failure Team  9:41 AM

## 2023-04-18 ENCOUNTER — Ambulatory Visit (HOSPITAL_COMMUNITY)
Admission: RE | Admit: 2023-04-18 | Discharge: 2023-04-18 | Disposition: A | Discharge status: Home / Self Care | Payer: No Typology Code available for payment source | Source: Ambulatory Visit | Attending: Adult Health | Admitting: Adult Health

## 2023-04-18 ENCOUNTER — Encounter (HOSPITAL_COMMUNITY): Payer: Self-pay

## 2023-04-18 VITALS — BP 104/74 | HR 95 | Wt 242.0 lb

## 2023-04-18 DIAGNOSIS — I493 Ventricular premature depolarization: Secondary | ICD-10-CM | POA: Insufficient documentation

## 2023-04-18 DIAGNOSIS — D0220 Carcinoma in situ of unspecified bronchus and lung: Secondary | ICD-10-CM | POA: Diagnosis not present

## 2023-04-18 DIAGNOSIS — I5022 Chronic systolic (congestive) heart failure: Secondary | ICD-10-CM | POA: Diagnosis present

## 2023-04-18 DIAGNOSIS — Z87891 Personal history of nicotine dependence: Secondary | ICD-10-CM | POA: Insufficient documentation

## 2023-04-18 DIAGNOSIS — I110 Hypertensive heart disease with heart failure: Secondary | ICD-10-CM | POA: Diagnosis not present

## 2023-04-18 DIAGNOSIS — E039 Hypothyroidism, unspecified: Secondary | ICD-10-CM | POA: Diagnosis not present

## 2023-04-18 DIAGNOSIS — G4733 Obstructive sleep apnea (adult) (pediatric): Secondary | ICD-10-CM | POA: Diagnosis not present

## 2023-04-18 DIAGNOSIS — Z85118 Personal history of other malignant neoplasm of bronchus and lung: Secondary | ICD-10-CM

## 2023-04-18 DIAGNOSIS — I340 Nonrheumatic mitral (valve) insufficiency: Secondary | ICD-10-CM | POA: Diagnosis not present

## 2023-04-18 DIAGNOSIS — Z85038 Personal history of other malignant neoplasm of large intestine: Secondary | ICD-10-CM | POA: Diagnosis not present

## 2023-04-18 DIAGNOSIS — J449 Chronic obstructive pulmonary disease, unspecified: Secondary | ICD-10-CM

## 2023-04-18 LAB — COMPREHENSIVE METABOLIC PANEL
ALT: 16 U/L (ref 0–44)
AST: 21 U/L (ref 15–41)
Albumin: 3.3 g/dL — ABNORMAL LOW (ref 3.5–5.0)
Alkaline Phosphatase: 46 U/L (ref 38–126)
Anion gap: 13 (ref 5–15)
BUN: 21 mg/dL (ref 8–23)
CO2: 23 mmol/L (ref 22–32)
Calcium: 9.2 mg/dL (ref 8.9–10.3)
Chloride: 103 mmol/L (ref 98–111)
Creatinine, Ser: 1.29 mg/dL — ABNORMAL HIGH (ref 0.61–1.24)
GFR, Estimated: >60 mL/min (ref >60)
Glucose, Bld: 155 mg/dL — ABNORMAL HIGH (ref 70–99)
Potassium: 3.8 mmol/L (ref 3.5–5.1)
Sodium: 139 mmol/L (ref 135–145)
Total Bilirubin: 0.6 mg/dL (ref 0.3–1.2)
Total Protein: 6.6 g/dL (ref 6.5–8.1)

## 2023-04-18 MED ORDER — ENTRESTO 24-26 MG PO TABS: 1.0000 | ORAL_TABLET | Freq: Two times a day (BID) | ORAL | 8 refills | Status: DC

## 2023-04-18 MED ORDER — FUROSEMIDE 20 MG PO TABS: 20.0000 mg | ORAL_TABLET | Freq: Every day | ORAL | 3 refills | Status: AC

## 2023-04-18 MED ORDER — ATORVASTATIN CALCIUM 20 MG PO TABS: 20.0000 mg | ORAL_TABLET | Freq: Every day | ORAL | 8 refills | Status: AC

## 2023-04-18 NOTE — Patient Instructions (Addendum)
Thank you for coming in today  Labs were done today, if any labs are abnormal the clinic will call you No news is good news  Medications: START Lasix 20 mg 1 tablet daily    Follow up appointments:  Your physician recommends that you schedule a follow-up appointment in:  4-6 weeks in clinic    Do the following things EVERYDAY: Weigh yourself in the morning before breakfast. Write it down and keep it in a log. Take your medicines as prescribed Eat low salt foods--Limit salt (sodium) to 2000 mg per day.  Stay as active as you can everyday Limit all fluids for the day to less than 2 liters   At the [facility], you and your health needs are our priority. As part of our continuing mission to provide you with exceptional heart care, we have created designated Provider Care Teams. These Care Teams include your primary Cardiologist (physician) and Advanced Practice Providers (APPs- Physician Assistants and Nurse Practitioners) who all work together to provide you with the care you need, when you need it.   You may see any of the following providers on your designated Care Team at your next follow up: Dr Arvilla Meres Dr Marca Ancona Dr. Marcos Eke, NP Robbie Lis, [place] [facility] [place], [place] Brynda Peon, NP Karle Plumber, PharmD   Please be sure to bring in all your medications bottles to every appointment.    Thank you for choosing [facility]  If you have any questions or concerns before your next appointment please send [place] a message through [place] or call our office at [phone].    TO LEAVE A MESSAGE FOR THE NURSE SELECT OPTION 2, PLEASE LEAVE A MESSAGE INCLUDING: YOUR NAME DATE OF BIRTH CALL BACK NUMBER REASON FOR CALL**this is important as we prioritize the call backs  YOU WILL RECEIVE A CALL BACK THE SAME DAY AS LONG AS YOU CALL BEFORE 4:00 PM

## 2023-04-18 NOTE — Progress Notes (Signed)
ReDS Vest / Clip - 04/18/23 0953       ReDS Vest / Clip   Station Marker C    Ruler Value 30    ReDS Value Range Low volume    ReDS Actual Value 34    Anatomical Comments sitting

## 2023-05-22 NOTE — Progress Notes (Signed)
Advanced Heart Failure Team Clinic Note   PCP: Lenn Sink HF Cardiologist: Dr. 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 Designer, television/film set with systolic CHF, COPD, OSA unable to tolerate CPAP, HTN, HCV, colon CA with met to lung 2001 s/p chemo with colon/lung resection. Smoked for a long time but quit in 2001 when he was found to have colon/lung/prostate CA.   Admitted 8/23 with acute systolic CHF with cardiogenic shock. BNP up to 1204. Echo EF < 20% RV mild to moderately down. Moderate to severe functional MR.  Required inotrope support with milrinone and diuresed with IV lasix. GDMT titrated. R/LHC on 0.125 milrinone with nonobstructive CAD, RA 4, PA mean 23, PCWP 9, Fick CO/CI 4.7/2.1, PA sat 57%/58%. 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 Mexiletine initially started but later switched to amiodarone d/t ineffectiveness.   - Echo 11/23 EF 25% RV mildly down Mild MR  - cMRI 2/24: EF 27%, GHK, normal RV size EF 34%  Follow up 5/24, NYHA II-early III. Volume up a bit. Lasix 20 mg daily started.   Today he returns for HF follow up with his wife and granddaughters. Overall feeling fine. He has mild SOB walking further distances on flat ground with his cane. Heat makes breathing worse. Denies palpitations, CP, dizziness, edema, or PND/Orthopnea. Appetite ok. No fever or chills. Weight at home 2385 pounds. Taking all medications. Sometimes skips his lasix, wife keeps a close eye on his symptoms.  Cardiac Testing - cMRI (2/24): EF 27%, GHK, normal RV size EF 34%  - Echo (8/23): EF < 20% RV mild - mod down and mod-severe functional MR  - Cath (8/23): Nonobstructive CAD  RA 4, PA mean 23, PCWP 9, Fick CO/CI 4.7/2.1, PA sat 57%/58%  ROS: All systems negative except as listed in HPI, PMH and Problem List.  SH:  Social History   Socioeconomic History   Marital status: Married    Spouse name: Not on file   Number of children: Not on file   Years of education: Not on file   Highest  education level: Not on file  Occupational History   Not on file  Tobacco Use   Smoking status: Former    Types: Cigarettes    Quit date: 10/23/2009    Years since quitting: 13.5   Smokeless tobacco: Former    Quit date: 10/23/2009  Vaping Use   Vaping Use: Never used  Substance and Sexual Activity   Alcohol use: Yes    Alcohol/week: 14.0 standard drinks of alcohol    Types: 14 Cans of beer per week   Drug use: No   Sexual activity: Not on file  Other Topics Concern   Not on file  Social History Narrative   Not on file   Social Determinants of Health   Financial Resource Strain: Not on file  Food Insecurity: No Food Insecurity (09/06/2022)   Hunger Vital Sign    Worried About Running Out of Food in the Last Year: Never true    Ran Out of Food in the Last Year: Never true  Transportation Needs: No Transportation Needs (09/06/2022)   PRAPARE - Administrator, Civil Service (Medical): No    Lack of Transportation (Non-Medical): No  Physical Activity: Not on file  Stress: Not on file  Social Connections: Not on file  Intimate Partner Violence: Not on file    FH:  Family History  Problem Relation Age of Onset   Colon cancer Father 54  Colon cancer Sister 15   Stomach cancer Neg Hx     Past Medical History:  Diagnosis Date   Cancer (HCC)    colon cancer 2001, prostate cancer 2011   COPD (chronic obstructive pulmonary disease) (HCC)    Emphysema of lung (HCC)    Hypertension    PTSD (post-traumatic stress disorder)    Seasonal allergies     Current Outpatient Medications  Medication Sig Dispense Refill   albuterol (PROVENTIL HFA) 108 (90 Base) MCG/ACT inhaler Inhale 2 puffs into the lungs. Every 4 to 6 hours as needed for wheezing and shortness of breath     amiodarone (PACERONE) 200 MG tablet Take 0.5 tablets (100 mg total) by mouth daily. 180 tablet 3   aspirin EC 81 MG tablet Take 1 tablet (81 mg total) by mouth daily. Swallow whole. 90 tablet 3    atorvastatin (LIPITOR) 20 MG tablet Take 1 tablet (20 mg total) by mouth daily. 30 tablet 8   carvedilol (COREG) 6.25 MG tablet Take 1 tablet (6.25 mg total) by mouth 2 (two) times daily. 180 tablet 3   empagliflozin (JARDIANCE) 10 MG TABS tablet Take 1 tablet (10 mg total) by mouth daily. 90 tablet 3   fluticasone (FLONASE) 50 MCG/ACT nasal spray 1 spray by Nasal route 2 (two) times daily.       furosemide (LASIX) 20 MG tablet Take 1 tablet (20 mg total) by mouth daily. 90 tablet 3   gabapentin (NEURONTIN) 400 MG capsule Take 1 capsule (400 mg total) by mouth 3 (three) times daily. (Patient taking differently: Take 400 mg by mouth 3 (three) times daily. As needed) 90 capsule 0   HYDROcodone-acetaminophen (NORCO/VICODIN) 5-325 MG tablet Take 1 tablet by mouth every 6 (six) hours as needed for moderate pain. 6 tablet 0   leuprolide, 6 Month, (LEUPROLIDE ACETATE, 6 MONTH,) 45 MG injection Inject 45 mg into the skin. Every 6 months     loratadine (CLARITIN) 10 MG tablet Take 10 mg by mouth daily.     Magnesium Oxide (MAOX) 420 MG TABS Take 400 mg by mouth daily.     methocarbamol (ROBAXIN) 500 MG tablet Take 500 mg by mouth 2 (two) times daily.     Potassium Chloride (KLOR-CON PO) Take 20 mEq by mouth daily.     sacubitril-valsartan (ENTRESTO) 24-26 MG Take 1 tablet by mouth 2 (two) times daily. 60 tablet 8   sertraline (ZOLOFT) 100 MG tablet Take 100 mg by mouth daily.     spironolactone (ALDACTONE) 25 MG tablet Take 1 tablet (25 mg total) by mouth daily. 90 tablet 3   Tetrahydrozoline HCl (VISINE OP) Place 1 drop into both eyes daily as needed (red eye).     tiotropium (SPIRIVA) 18 MCG inhalation capsule Place 18 mcg into inhaler and inhale daily.     No current facility-administered medications for this encounter.   BP 100/76   Pulse 82   Wt 108 kg (238 lb)   SpO2 95%   BMI 33.19 kg/m   Wt Readings from Last 3 Encounters:  05/24/23 108 kg (238 lb)  04/18/23 109.8 kg (242 lb)  02/22/23  109.2 kg (240 lb 12.8 oz)   PHYSICAL EXAM: General:  NAD. No resp difficulty, walked into clinic with cane HEENT: Normal Neck: Supple. No JVD. Carotids 2+ bilat; no bruits. No lymphadenopathy or thryomegaly appreciated. Cor: PMI nondisplaced. Regular rate & rhythm. No rubs, gallops or murmurs. Lungs: Clear, diminished in bases Abdomen: Soft, nontender, nondistended. No hepatosplenomegaly.  No bruits or masses. Good bowel sounds. Extremities: No cyanosis, clubbing, rash, edema Neuro: Alert & oriented x 3, cranial nerves grossly intact. Moves all 4 extremities w/o difficulty. Affect pleasant.  ASSESSMENT & PLAN:  1.Chronic HFrEF/ICM - new diagnosis 08/23 - required milrinone for support. Etiology unclear. Suspect possible PVC-mediated - Echo (8/23): EF < 20%, mild to moderate RV dysfunction, moderate to severe functional MR  - R/LHC (8/23): with minimal non obs CAD and moderately reduced CO on 0.125 milrinone. - Echo (11/23): EF 25% - cMRI (2/24): EF 27% RV 34% minimal LGE - Zio < 1% PVCs 10/23 - NYHA II. Volume status stable. No BP room to titrate GDMT today. - Continue Lasix 20 mg daily + 20 KCL daily. - Continue carvedilol 6.25 mg bid, will not titrate today with fatigue - Continue spiro 25 mg daily  - Continue Jardiance 10 mg daily, no GU symptoms - Continue Entresto 24/26 mg bid. - EF seems to be improving slowly with GDMT and PVC suppression. Would continue current course (per Dr. Gala Romney). If EF not improving further will need ICD.  - Repeat echo. - Labs today.   2. Mitral regurgitation - Now mild on echo 11/23 - Follow   3. COPD - Stable quit smoking 2001  4. PVCs  - Failed mexiletine - Continue amiodarone 100 mg daily. - Zio (10/23)  < 1% PVCs  5. OSA - Unable to tolerate CPAP  - Tried nasal prongs, failed 2/2 claustrophobia  6. Elevate thyroid panels - THS 8.4 (4/24), LFTs ok (5/24) - Follow amio labs. He will need regular eye exam   7. H/o colon CA  - s/p  partial colectomy 2001 and chemo   8. H/o lung CA  - s/p partial lobectomy - Details unclear  Repeat echo. Follow up in 3-4 months with Dr. Mickle Plumb FNP-BC 1:39 PM

## 2023-05-24 ENCOUNTER — Encounter (HOSPITAL_COMMUNITY): Payer: Self-pay

## 2023-05-24 ENCOUNTER — Ambulatory Visit (HOSPITAL_COMMUNITY)
Admission: RE | Admit: 2023-05-24 | Discharge: 2023-05-24 | Disposition: A | Discharge status: Home / Self Care | Payer: No Typology Code available for payment source | Source: Ambulatory Visit | Attending: Family Medicine | Admitting: Family Medicine

## 2023-05-24 VITALS — BP 100/76 | HR 82 | Wt 238.0 lb

## 2023-05-24 DIAGNOSIS — Z87891 Personal history of nicotine dependence: Secondary | ICD-10-CM | POA: Insufficient documentation

## 2023-05-24 DIAGNOSIS — J449 Chronic obstructive pulmonary disease, unspecified: Secondary | ICD-10-CM | POA: Diagnosis not present

## 2023-05-24 DIAGNOSIS — Z85118 Personal history of other malignant neoplasm of bronchus and lung: Secondary | ICD-10-CM | POA: Diagnosis not present

## 2023-05-24 DIAGNOSIS — I110 Hypertensive heart disease with heart failure: Secondary | ICD-10-CM | POA: Diagnosis not present

## 2023-05-24 DIAGNOSIS — G4733 Obstructive sleep apnea (adult) (pediatric): Secondary | ICD-10-CM | POA: Insufficient documentation

## 2023-05-24 DIAGNOSIS — Z85038 Personal history of other malignant neoplasm of large intestine: Secondary | ICD-10-CM | POA: Insufficient documentation

## 2023-05-24 DIAGNOSIS — I493 Ventricular premature depolarization: Secondary | ICD-10-CM | POA: Insufficient documentation

## 2023-05-24 DIAGNOSIS — I340 Nonrheumatic mitral (valve) insufficiency: Secondary | ICD-10-CM | POA: Insufficient documentation

## 2023-05-24 DIAGNOSIS — I5022 Chronic systolic (congestive) heart failure: Secondary | ICD-10-CM | POA: Diagnosis not present

## 2023-05-24 DIAGNOSIS — E039 Hypothyroidism, unspecified: Secondary | ICD-10-CM

## 2023-05-24 LAB — BASIC METABOLIC PANEL
Anion gap: 8 (ref 5–15)
BUN: 16 mg/dL (ref 8–23)
CO2: 24 mmol/L (ref 22–32)
Calcium: 9 mg/dL (ref 8.9–10.3)
Chloride: 102 mmol/L (ref 98–111)
Creatinine, Ser: 1.31 mg/dL — ABNORMAL HIGH (ref 0.61–1.24)
GFR, Estimated: 59 mL/min — ABNORMAL LOW (ref >60)
Glucose, Bld: 159 mg/dL — ABNORMAL HIGH (ref 70–99)
Potassium: 4.3 mmol/L (ref 3.5–5.1)
Sodium: 134 mmol/L — ABNORMAL LOW (ref 135–145)

## 2023-05-24 NOTE — Patient Instructions (Addendum)
Thank you for coming in today  If you had labs drawn today, any labs that are abnormal the clinic will call you No news is good news  Medications: No changes  Follow up appointments:  Your physician recommends that you schedule a follow-up appointment in:  3-4 months With Dr. Gala Romney    Do the following things EVERYDAY: Weigh yourself in the morning before breakfast. Write it down and keep it in a log. Take your medicines as prescribed Eat low salt foods--Limit salt (sodium) to 2000 mg per day.  Stay as active as you can everyday Limit all fluids for the day to less than 2 liters   At the Advanced [facility] Failure Clinic, you and your health needs are our priority. As part of our continuing mission to provide you with exceptional heart care, we have created designated Provider Care Teams. These Care Teams include your primary Cardiologist (physician) and Advanced Practice Providers (APPs- Physician Assistants and Nurse Practitioners) who all work together to provide you with the care you need, when you need it.   You may see any of the following providers on your designated Care Team at your next follow up: Dr Arvilla Meres Dr Marca Ancona Dr. Marcos Eke, NP Robbie Lis, [place] [facility], [place] Brynda Peon, NP Karle Plumber, PharmD   Please be sure to bring in all your medications bottles to every appointment.    Thank you for choosing [facility]  If you have any questions or concerns before your next appointment please send [place] a message through [place] or call our office at [phone].    TO LEAVE A MESSAGE FOR THE NURSE SELECT OPTION 2, PLEASE LEAVE A MESSAGE INCLUDING: YOUR NAME DATE OF BIRTH CALL BACK NUMBER REASON FOR CALL**this is important as we prioritize the call backs  YOU WILL RECEIVE A CALL BACK THE SAME DAY AS LONG AS YOU CALL BEFORE 4:00 PM

## 2023-05-28 ENCOUNTER — Telehealth (HOSPITAL_COMMUNITY): Payer: Self-pay

## 2023-05-28 NOTE — Telephone Encounter (Signed)
A community Care Referral for [facility] has been successfully faxed to [phone].

## 2023-06-07 ENCOUNTER — Ambulatory Visit (HOSPITAL_COMMUNITY)
Admission: RE | Admit: 2023-06-07 | Discharge: 2023-06-07 | Disposition: A | Discharge status: Home / Self Care | Payer: No Typology Code available for payment source | Source: Ambulatory Visit | Attending: Cardiovascular Disease | Admitting: Cardiovascular Disease

## 2023-06-07 DIAGNOSIS — E785 Hyperlipidemia, unspecified: Secondary | ICD-10-CM | POA: Diagnosis not present

## 2023-06-07 DIAGNOSIS — Z853 Personal history of malignant neoplasm of breast: Secondary | ICD-10-CM | POA: Insufficient documentation

## 2023-06-07 DIAGNOSIS — I5022 Chronic systolic (congestive) heart failure: Secondary | ICD-10-CM | POA: Diagnosis not present

## 2023-06-07 DIAGNOSIS — I5040 Unspecified combined systolic (congestive) and diastolic (congestive) heart failure: Secondary | ICD-10-CM | POA: Insufficient documentation

## 2023-06-07 DIAGNOSIS — G4730 Sleep apnea, unspecified: Secondary | ICD-10-CM | POA: Insufficient documentation

## 2023-06-07 DIAGNOSIS — I110 Hypertensive heart disease with heart failure: Secondary | ICD-10-CM | POA: Insufficient documentation

## 2023-06-07 DIAGNOSIS — J449 Chronic obstructive pulmonary disease, unspecified: Secondary | ICD-10-CM | POA: Insufficient documentation

## 2023-06-07 LAB — ECHOCARDIOGRAM COMPLETE
Area-P 1/2: 4.96 cm2
Calc EF: 32.1 %
S' Lateral: 4.8 cm
Single Plane A2C EF: 23.6 %
Single Plane A4C EF: 38.7 %

## 2023-06-07 NOTE — Progress Notes (Signed)
  Echocardiogram 2D Echocardiogram has been performed.  Janalyn Harder 06/07/2023, 1:55 PM

## 2023-06-23 ENCOUNTER — Encounter: Payer: Self-pay | Admitting: Cardiovascular Disease

## 2023-06-28 NOTE — Telephone Encounter (Signed)
Pt/pts wife aware 

## 2023-07-17 ENCOUNTER — Telehealth (HOSPITAL_COMMUNITY): Payer: Self-pay

## 2023-07-17 MED ORDER — LOSARTAN POTASSIUM 25 MG PO TABS: 12.5000 mg | ORAL_TABLET | Freq: Every day | ORAL | 3 refills | Status: AC

## 2023-07-17 NOTE — Telephone Encounter (Signed)
Received call from patients wife- patient has complaints of fatigue and dizziness   Last Thursday- almost fell backwards has not happened since then   Symptoms have been ongoing for 2 weeks   States that he does feel well hydrated   Has been taking all medications as prescribed   PCP changed amiodarone to 200mg  twice daily- per patients wife  Per patients wife patient has been taking only the half a tablet (100mg  Amiodarone)  Per patients wife- patients blood pressure is 84/57 right now and heart rate is 89.   Advised patients wife to ensure patient stays well hydrated, change positions slowly and that I will forward message over to provider for review. Gave ER precautions as well should new or worsening symptoms develop in the mean time. Patients wife verbalized understanding.

## 2023-07-17 NOTE — Telephone Encounter (Signed)
[place], [place], FNP  You10 minutes ago (1:32 PM)    Keep amiodarone at 100 mg daily.  Do not take anymore BP-active meds today.  Stop Entresto and start losartan 12.5 mg daily. Notify clinic if symptoms persistent or worsen    Returned call to patients wife and made her aware of the above. Medication list updated and medication sent to patient preferred pharmacy.   Patients wife will monitor BP/Symptoms./

## 2023-08-05 ENCOUNTER — Telehealth (HOSPITAL_COMMUNITY): Payer: Self-pay | Admitting: Cardiology

## 2023-08-05 NOTE — Telephone Encounter (Signed)
Would stop losartan altogether. If continues, will need to check labs to see if he is getting volume depleted.

## 2023-08-05 NOTE — Telephone Encounter (Signed)
Patients wife called with concerns regarding persistent  dizziness.  Recent med changes 7/31 decrease amio to 100 mg daily Discontinue entresto Start losartan 15.5 mg daily  Reports compliance with hydration   B/P mildly improving  75/59, 82/62, 78/50, 94/82, 102/51, 90/62, 101/56  Weight stable 229-233  Denies falls or injury   Please advise

## 2023-08-06 NOTE — Telephone Encounter (Signed)
Pt wife aware and voiced understanding.

## 2023-09-20 ENCOUNTER — Ambulatory Visit (HOSPITAL_COMMUNITY)
Admission: RE | Admit: 2023-09-20 | Discharge: 2023-09-20 | Disposition: A | Discharge status: Home / Self Care | Payer: No Typology Code available for payment source | Source: Ambulatory Visit | Attending: Internal Medicine | Admitting: Internal Medicine

## 2023-09-20 ENCOUNTER — Encounter (HOSPITAL_COMMUNITY): Payer: Self-pay | Admitting: Internal Medicine

## 2023-09-20 VITALS — BP 120/86 | HR 89 | Wt 233.0 lb

## 2023-09-20 DIAGNOSIS — I5022 Chronic systolic (congestive) heart failure: Secondary | ICD-10-CM | POA: Diagnosis not present

## 2023-09-20 DIAGNOSIS — I493 Ventricular premature depolarization: Secondary | ICD-10-CM | POA: Diagnosis not present

## 2023-09-20 DIAGNOSIS — I110 Hypertensive heart disease with heart failure: Secondary | ICD-10-CM | POA: Diagnosis present

## 2023-09-20 DIAGNOSIS — I959 Hypotension, unspecified: Secondary | ICD-10-CM | POA: Diagnosis not present

## 2023-09-20 DIAGNOSIS — G4733 Obstructive sleep apnea (adult) (pediatric): Secondary | ICD-10-CM | POA: Diagnosis not present

## 2023-09-20 DIAGNOSIS — I2510 Atherosclerotic heart disease of native coronary artery without angina pectoris: Secondary | ICD-10-CM | POA: Insufficient documentation

## 2023-09-20 DIAGNOSIS — C189 Malignant neoplasm of colon, unspecified: Secondary | ICD-10-CM | POA: Insufficient documentation

## 2023-09-20 DIAGNOSIS — Z7984 Long term (current) use of oral hypoglycemic drugs: Secondary | ICD-10-CM | POA: Diagnosis not present

## 2023-09-20 DIAGNOSIS — Z9049 Acquired absence of other specified parts of digestive tract: Secondary | ICD-10-CM | POA: Insufficient documentation

## 2023-09-20 DIAGNOSIS — F40240 Claustrophobia: Secondary | ICD-10-CM | POA: Diagnosis not present

## 2023-09-20 DIAGNOSIS — Z79899 Other long term (current) drug therapy: Secondary | ICD-10-CM | POA: Diagnosis not present

## 2023-09-20 DIAGNOSIS — Z8546 Personal history of malignant neoplasm of prostate: Secondary | ICD-10-CM | POA: Insufficient documentation

## 2023-09-20 DIAGNOSIS — Z902 Acquired absence of lung [part of]: Secondary | ICD-10-CM | POA: Insufficient documentation

## 2023-09-20 DIAGNOSIS — I5023 Acute on chronic systolic (congestive) heart failure: Secondary | ICD-10-CM | POA: Insufficient documentation

## 2023-09-20 DIAGNOSIS — Z87891 Personal history of nicotine dependence: Secondary | ICD-10-CM | POA: Diagnosis not present

## 2023-09-20 DIAGNOSIS — J439 Emphysema, unspecified: Secondary | ICD-10-CM | POA: Insufficient documentation

## 2023-09-20 LAB — COMPREHENSIVE METABOLIC PANEL
ALT: 12 U/L (ref 0–44)
AST: 19 U/L (ref 15–41)
Albumin: 3.5 g/dL (ref 3.5–5.0)
Alkaline Phosphatase: 55 U/L (ref 38–126)
Anion gap: 11 (ref 5–15)
BUN: 13 mg/dL (ref 8–23)
CO2: 26 mmol/L (ref 22–32)
Calcium: 9.1 mg/dL (ref 8.9–10.3)
Chloride: 106 mmol/L (ref 98–111)
Creatinine, Ser: 1.2 mg/dL (ref 0.61–1.24)
GFR, Estimated: >60 mL/min (ref >60)
Glucose, Bld: 112 mg/dL — ABNORMAL HIGH (ref 70–99)
Potassium: 4 mmol/L (ref 3.5–5.1)
Sodium: 143 mmol/L (ref 135–145)
Total Bilirubin: 0.6 mg/dL (ref 0.3–1.2)
Total Protein: 6.8 g/dL (ref 6.5–8.1)

## 2023-09-20 LAB — BRAIN NATRIURETIC PEPTIDE: B Natriuretic Peptide: 24.7 pg/mL (ref 0.0–100.0)

## 2023-09-20 LAB — TSH: TSH: 5.039 u[IU]/mL — ABNORMAL HIGH (ref 0.350–4.500)

## 2023-09-20 NOTE — Progress Notes (Signed)
Advanced Heart Failure Team Clinic Note   PCP: Lenn Sink HF Cardiologist: Dr. 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 Designer, television/film set with systolic CHF, COPD, OSA unable to tolerate CPAP, HTN, HCV, colon CA with met to lung 2001 s/p chemo with colon/lung resection. Smoked for a long time but quit in 2001 when he was found to have colon/lung/prostate CA.   Admitted 8/23 with acute systolic CHF with cardiogenic shock. BNP up to 1204. Echo EF < 20% RV mild to moderately down. Moderate to severe functional MR.  Required inotrope support with milrinone and diuresed with IV lasix. GDMT titrated. R/LHC on 0.125 milrinone with nonobstructive CAD, RA 4, PA mean 23, PCWP 9, Fick CO/CI 4.7/2.1, PA sat 57%/58%. 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 Mexiletine initially started but later switched to amiodarone d/t ineffectiveness.   - Echo 11/23 EF 25% RV mildly down Mild MR  - cMRI 2/24: EF 27%, GHK, normal RV size EF 34%  Follow up 5/24, NYHA II-early III. Volume up a bit. Lasix 20 mg daily started.   Today he returns for HF follow up with his wife and granddaughter. Overall feeling fine. In August was having low BP (systolics in 70-80s).Entresto stopped. Now on losartan 12.5. No more low BPs. Breathing ok as long as he uses his inhaler and takes his time. No edema, orthopnea or PND.     Cardiac Testing - cMRI (2/24): EF 27%, GHK, normal RV size EF 34%  - Echo (8/23): EF < 20% RV mild - mod down and mod-severe functional MR  - Cath (8/23): Nonobstructive CAD  RA 4, PA mean 23, PCWP 9, Fick CO/CI 4.7/2.1, PA sat 57%/58%  ROS: All systems negative except as listed in HPI, PMH and Problem List.  SH:  Social History   Socioeconomic History   Marital status: Married    Spouse name: Not on file   Number of children: Not on file   Years of education: Not on file   Highest education level: Not on file  Occupational History   Not on file  Tobacco Use   Smoking status: Former     Current packs/day: 0.00    Types: Cigarettes    Quit date: 10/23/2009    Years since quitting: 13.9   Smokeless tobacco: Former    Quit date: 10/23/2009  Vaping Use   Vaping status: Never Used  Substance and Sexual Activity   Alcohol use: Yes    Alcohol/week: 14.0 standard drinks of alcohol    Types: 14 Cans of beer per week   Drug use: No   Sexual activity: Not on file  Other Topics Concern   Not on file  Social History Narrative   Not on file   Social Determinants of Health   Financial Resource Strain: Not on file  Food Insecurity: No Food Insecurity (09/06/2022)   Hunger Vital Sign    Worried About Running Out of Food in the Last Year: Never true    Ran Out of Food in the Last Year: Never true  Transportation Needs: No Transportation Needs (09/06/2022)   PRAPARE - Administrator, Civil Service (Medical): No    Lack of Transportation (Non-Medical): No  Physical Activity: Not on file  Stress: Not on file  Social Connections: Unknown (04/30/2022)   [facility], [facility]   Social Network    Social Network: Not on file  Intimate Partner Violence: Unknown (03/22/2022)   [facility], [facility]  HITS    Physically Hurt: Not on file    Insult or Talk Down To: Not on file    Threaten Physical Harm: Not on file    Scream or Curse: Not on file    FH:  Family History  Problem Relation Age of Onset   Colon cancer Father 34   Colon cancer Sister 97   Stomach cancer Neg Hx     Past Medical History:  Diagnosis Date   Cancer (HCC)    colon cancer 2001, prostate cancer 2011   COPD (chronic obstructive pulmonary disease) (HCC)    Emphysema of lung (HCC)    Hypertension    PTSD (post-traumatic stress disorder)    Seasonal allergies     Current Outpatient Medications  Medication Sig Dispense Refill   albuterol (PROVENTIL HFA) 108 (90 Base) MCG/ACT inhaler Inhale 2 puffs into the lungs. Every 4 to 6 hours as needed for  wheezing and shortness of breath     amiodarone (PACERONE) 200 MG tablet Take 0.5 tablets (100 mg total) by mouth daily. 180 tablet 3   aspirin EC 81 MG tablet Take 1 tablet (81 mg total) by mouth daily. Swallow whole. 90 tablet 3   atorvastatin (LIPITOR) 20 MG tablet Take 1 tablet (20 mg total) by mouth daily. 30 tablet 8   carvedilol (COREG) 6.25 MG tablet Take 1 tablet (6.25 mg total) by mouth 2 (two) times daily. 180 tablet 3   empagliflozin (JARDIANCE) 10 MG TABS tablet Take 1 tablet (10 mg total) by mouth daily. 90 tablet 3   fluticasone (FLONASE) 50 MCG/ACT nasal spray 1 spray by Nasal route 2 (two) times daily.       furosemide (LASIX) 20 MG tablet Take 1 tablet (20 mg total) by mouth daily. 90 tablet 3   gabapentin (NEURONTIN) 400 MG capsule Take 1 capsule (400 mg total) by mouth 3 (three) times daily. (Patient taking differently: Take 400 mg by mouth 3 (three) times daily. As needed) 90 capsule 0   HYDROcodone-acetaminophen (NORCO/VICODIN) 5-325 MG tablet Take 1 tablet by mouth every 6 (six) hours as needed for moderate pain. 6 tablet 0   leuprolide, 6 Month, (LEUPROLIDE ACETATE, 6 MONTH,) 45 MG injection Inject 45 mg into the skin. Every 6 months     loratadine (CLARITIN) 10 MG tablet Take 10 mg by mouth daily.     losartan (COZAAR) 25 MG tablet Take 0.5 tablets (12.5 mg total) by mouth daily. 45 tablet 3   Magnesium Oxide (MAOX) 420 MG TABS Take 400 mg by mouth daily.     methocarbamol (ROBAXIN) 500 MG tablet Take 500 mg by mouth 2 (two) times daily.     Potassium Chloride (KLOR-CON PO) Take 20 mEq by mouth daily.     sertraline (ZOLOFT) 100 MG tablet Take 100 mg by mouth daily.     spironolactone (ALDACTONE) 25 MG tablet Take 1 tablet (25 mg total) by mouth daily. 90 tablet 3   Tetrahydrozoline HCl (VISINE OP) Place 1 drop into both eyes daily as needed (red eye).     tiotropium (SPIRIVA) 18 MCG inhalation capsule Place 18 mcg into inhaler and inhale daily.     No current  facility-administered medications for this encounter.   BP 120/86   Pulse 89   Wt 105.7 kg (233 lb)   SpO2 96%   BMI 32.50 kg/m   Wt Readings from Last 3 Encounters:  09/20/23 105.7 kg (233 lb)  05/24/23 108 kg (238 lb)  04/18/23  109.8 kg (242 lb)   PHYSICAL EXAM: General:  NAD. No resp difficulty, walked into clinic with cane HEENT: normal Neck: supple. no JVD. Carotids 2+ bilat; no bruits. No lymphadenopathy or thryomegaly appreciated. Cor: PMI nondisplaced. Regular rate & rhythm. No rubs, gallops or murmurs. Lungs: clear Abdomen: soft, nontender, nondistended. No hepatosplenomegaly. No bruits or masses. Good bowel sounds. Extremities: no cyanosis, clubbing, rash, edema Neuro: alert & orientedx3, cranial nerves grossly intact. moves all 4 extremities w/o difficulty. Affect pleasant   ASSESSMENT & PLAN:   1.Chronic HFrEF/ICM - new diagnosis 08/23 - required milrinone for support. Etiology unclear. Suspect possible PVC-mediated - Echo (8/23): EF < 20%, mild to moderate RV dysfunction, moderate to severe functional MR  - R/LHC (8/23): with minimal non obs CAD and moderately reduced CO on 0.125 milrinone. - Echo (11/23): EF 25% - cMRI (2/24): EF 27% RV 34% minimal LGE - Echo 6/24: EF 25-30% - Zio < 1% PVCs 10/23 - NYHA II . Volume ok - Continue Lasix 20 mg daily + 20 KCL daily. - Continue carvedilol 6.25 mg bid - Continue spiro 25 mg daily  - Continue Jardiance 10 mg daily, no GU symptoms - Off Entresto due to low BP. Now on losartan 12.5 - BP too low to titrate GDMT  - EF not significantly improved with GDMT and PVC suppression. - No FHx of HF to suggest genetic component but they would like to proceed with genetic testing  - We discussed potential ICD. He would like to wait to see next echo to see if EF improving with GDMT.     2. COPD - Stable quit smoking 2001 - No change  3. PVCs  - Failed mexiletine - Continue amiodarone 100 mg daily. - Zio (10/23)  < 1%  PVCs  4. OSA - Unable to tolerate CPAP  - Tried nasal prongs, failed 2/2 claustrophobia - No change  5. Elevate thyroid panels - TSH 8.4 (4/24), LFTs ok (5/24) - Repeat amio labs today    6. H/o colon CA  - s/p partial colectomy 2001 and chemo   7. H/o lung CA  - s/p partial lobectomy   Arvilla Meres MD 10:00 AM

## 2023-09-20 NOTE — Patient Instructions (Addendum)
Good to see you today!  Genetic test has been done, this has to be sent to [place] to be processed and can take 1-2 weeks to get results back.  We will let you know the results.  Your physician has requested that you have an echocardiogram. Echocardiography is a painless test that uses sound waves to create images of your heart. It provides your doctor with information about the size and shape of your heart and how well your heart's chambers and valves are working. This procedure takes approximately one hour. There are no restrictions for this procedure. Please do NOT wear cologne, perfume, aftershave, or lotions (deodorant is allowed). Please arrive 15 minutes prior to your appointment time.  Labs done today, your results will be available in MyChart, we will contact you for abnormal readings Your physician recommends that you schedule a follow-up appointment in: 6 months(April 2025) Call office in February 2025  to schedule an appointment   If you have any questions or concerns before your next appointment please send [place] a message through [place] or call our office at [phone].    TO LEAVE A MESSAGE FOR THE NURSE SELECT OPTION 2, PLEASE LEAVE A MESSAGE INCLUDING: YOUR NAME DATE OF BIRTH CALL BACK NUMBER REASON FOR CALL**this is important as we prioritize the call backs  YOU WILL RECEIVE A CALL BACK THE SAME DAY AS LONG AS YOU CALL BEFORE 4:00 PM  At the [facility], you and your health needs are our priority. As part of our continuing mission to provide you with exceptional heart care, we have created designated Provider Care Teams. These Care Teams include your primary Cardiologist (physician) and Advanced Practice Providers (APPs- Physician Assistants and Nurse Practitioners) who all work together to provide you with the care you need, when you need it.   You may see any of the following providers on your designated Care Team at your next follow up: Dr Arvilla Meres Dr Marca Ancona Dr. Dorthula Nettles Dr. Clearnce Hasten Amy Filbert Schilder, NP Robbie Lis, [place] [facility] [place], [place] Brynda Peon, NP [place] Lee, NP Karle Plumber, PharmD   Please be sure to bring in all your medications bottles to every appointment.    Thank you for choosing [facility]

## 2023-09-21 LAB — T3: T3, Total: 107 ng/dL (ref 71–180)

## 2023-10-10 ENCOUNTER — Other Ambulatory Visit (HOSPITAL_COMMUNITY): Payer: Self-pay | Admitting: Internal Medicine

## 2024-02-03 ENCOUNTER — Telehealth: Payer: Self-pay | Admitting: Gastroenterology

## 2024-02-03 NOTE — Telephone Encounter (Signed)
 I will review this request when I return to the office on Friday.

## 2024-02-03 NOTE — Telephone Encounter (Signed)
 Dr. 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 EUS. Records in EPIC.  Please review and advise scheduling.  Thanks

## 2024-02-04 NOTE — Telephone Encounter (Signed)
 Review of patient's chart and referral. Has very low EF and has had endoscopic procedures at [facility] due to his high risk status in the past with GI. Recommend that patient have continued GI procedures at the [facility] system. He needs updated Colonoscopy and also consideration of EUS, but with underlying HF and BP issues and prior prostate CA, not sure he is a great candidate on paper for High Risk Pancreas Cancer screening. Thus again, best for patient to have procedures at his prior GI with HR nature and being [facility]. [facility] let [facility] know that [facility] this [facility] at this time. GM

## 2024-02-13 NOTE — Telephone Encounter (Signed)
 Patient has been made aware of recommendations given by Dr. Meridee Score. [facility] to advise [facility] on recommendations.

## 2024-02-24 ENCOUNTER — Other Ambulatory Visit (HOSPITAL_COMMUNITY): Payer: Self-pay | Admitting: Cardiology

## 2024-02-24 MED ORDER — CARVEDILOL 6.25 MG PO TABS: 6.2500 mg | ORAL_TABLET | Freq: Two times a day (BID) | ORAL | 3 refills | Status: AC

## 2024-03-17 ENCOUNTER — Telehealth (HOSPITAL_COMMUNITY): Payer: Self-pay | Admitting: Cardiology

## 2024-03-17 NOTE — Telephone Encounter (Signed)
 Pts wife called to request cardiac clearance for upcoming eye surgery Advised clearance requests should come over via fax from requesting provider. Include procedure details   [facility] [phone] Spoke to PA aware if clearance is needed will fax request to [phone]

## 2024-03-25 ENCOUNTER — Telehealth (HOSPITAL_COMMUNITY): Payer: Self-pay

## 2024-03-25 NOTE — Telephone Encounter (Signed)
  [facility]   Pre-operative Risk Assessment   HEARTCARE STAFF-IMPORTANT INSTRUCTIONS 1 Red and Blue Text will auto delete once note is signed or closed. 2 Press F2 to navigate through template.   3 On drop down lists, L click to select >> R click to activate next field 4 Reason for Visit format is IMPORTANT!!  See Directions on No. 2 below. 5 Please review chart to determine if there is already a clearance note open for this procedure!!  DO NOT duplicate if a note already exists!!    :1}      Request for Surgical Clearance    Procedure:   Cataract Extraction By PE, IOL of both eyes  Date of Surgery:  Clearance 04/09/24  and 04/30/24                                Surgeon:  Dr. Mack Hook Surgeon's Group or Practice Name:  [facility] Phone number:  [phone] Fax number:  [phone]   Type of Clearance Requested:   - Medical    Type of Anesthesia:   IV Sedation    Additional requests/questions:  Please fax a copy of Clearance form  to the surgeon's office.  Signed, Linda Hedges   03/25/2024, 11:33 AM   [facility]  [facility] [address] [facility] [place] [address] [phone] (office) [phone] (fax)

## 2024-04-10 NOTE — Telephone Encounter (Signed)
 Medical clearance form faxed via Epic

## 2024-09-17 ENCOUNTER — Ambulatory Visit: Payer: Self-pay | Admitting: Family Medicine

## 2024-10-20 ENCOUNTER — Emergency Department (HOSPITAL_COMMUNITY)
Admission: EM | Admit: 2024-10-20 | Discharge: 2024-10-20 | Disposition: A | Discharge status: Home / Self Care | Attending: Emergency Medicine | Admitting: Emergency Medicine

## 2024-10-20 ENCOUNTER — Other Ambulatory Visit: Payer: Self-pay

## 2024-10-20 DIAGNOSIS — Y92410 Unspecified street and highway as the place of occurrence of the external cause: Secondary | ICD-10-CM | POA: Insufficient documentation

## 2024-10-20 DIAGNOSIS — M546 Pain in thoracic spine: Secondary | ICD-10-CM | POA: Insufficient documentation

## 2024-10-20 DIAGNOSIS — R079 Chest pain, unspecified: Secondary | ICD-10-CM | POA: Insufficient documentation

## 2024-10-20 DIAGNOSIS — M545 Low back pain, unspecified: Secondary | ICD-10-CM | POA: Insufficient documentation

## 2024-10-20 DIAGNOSIS — Z7982 Long term (current) use of aspirin: Secondary | ICD-10-CM | POA: Diagnosis not present

## 2024-10-20 NOTE — Discharge Instructions (Signed)
 Return if any problems.
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
